# Patient Record
Sex: Female | Born: 1970 | Race: White | Hispanic: No | Smoking: Current every day smoker
Health system: Southern US, Community
[De-identification: ages and names within clinical notes are randomized; demographics above are authoritative.]

## PROBLEM LIST (undated history)

## (undated) ENCOUNTER — Emergency Department (HOSPITAL_BASED_OUTPATIENT_CLINIC_OR_DEPARTMENT_OTHER): Admission: EM | Payer: Self-pay | Source: Home / Self Care

## (undated) DIAGNOSIS — Z5189 Encounter for other specified aftercare: Secondary | ICD-10-CM

## (undated) DIAGNOSIS — K274 Chronic or unspecified peptic ulcer, site unspecified, with hemorrhage: Secondary | ICD-10-CM

## (undated) DIAGNOSIS — F988 Other specified behavioral and emotional disorders with onset usually occurring in childhood and adolescence: Secondary | ICD-10-CM

## (undated) DIAGNOSIS — F329 Major depressive disorder, single episode, unspecified: Secondary | ICD-10-CM

## (undated) DIAGNOSIS — J189 Pneumonia, unspecified organism: Secondary | ICD-10-CM

## (undated) DIAGNOSIS — A4902 Methicillin resistant Staphylococcus aureus infection, unspecified site: Secondary | ICD-10-CM

## (undated) DIAGNOSIS — F419 Anxiety disorder, unspecified: Secondary | ICD-10-CM

## (undated) DIAGNOSIS — D649 Anemia, unspecified: Secondary | ICD-10-CM

## (undated) DIAGNOSIS — N809 Endometriosis, unspecified: Secondary | ICD-10-CM

## (undated) DIAGNOSIS — D219 Benign neoplasm of connective and other soft tissue, unspecified: Secondary | ICD-10-CM

## (undated) DIAGNOSIS — F32A Depression, unspecified: Secondary | ICD-10-CM

## (undated) HISTORY — DX: Chronic or unspecified peptic ulcer, site unspecified, with hemorrhage: K27.4

## (undated) HISTORY — PX: BREAST ENHANCEMENT SURGERY: SHX7

## (undated) HISTORY — DX: Benign neoplasm of connective and other soft tissue, unspecified: D21.9

## (undated) HISTORY — PX: DILATION AND CURETTAGE OF UTERUS: SHX78

## (undated) HISTORY — DX: Major depressive disorder, single episode, unspecified: F32.9

## (undated) HISTORY — DX: Depression, unspecified: F32.A

## (undated) HISTORY — DX: Endometriosis, unspecified: N80.9

---

## 1999-08-12 ENCOUNTER — Other Ambulatory Visit: Admission: RE | Admit: 1999-08-12 | Discharge: 1999-08-12 | Payer: Self-pay | Admitting: Obstetrics and Gynecology

## 2001-02-01 ENCOUNTER — Encounter: Admission: RE | Admit: 2001-02-01 | Discharge: 2001-05-02 | Payer: Self-pay | Admitting: Family Medicine

## 2003-04-10 ENCOUNTER — Ambulatory Visit (HOSPITAL_COMMUNITY): Admission: RE | Admit: 2003-04-10 | Discharge: 2003-04-10 | Payer: Self-pay | Admitting: Obstetrics and Gynecology

## 2003-07-24 ENCOUNTER — Ambulatory Visit (HOSPITAL_COMMUNITY): Admission: RE | Admit: 2003-07-24 | Discharge: 2003-07-24 | Payer: Self-pay | Admitting: Obstetrics and Gynecology

## 2003-08-10 ENCOUNTER — Ambulatory Visit (HOSPITAL_COMMUNITY): Admission: RE | Admit: 2003-08-10 | Discharge: 2003-08-10 | Payer: Self-pay | Admitting: Obstetrics & Gynecology

## 2003-08-12 ENCOUNTER — Inpatient Hospital Stay (HOSPITAL_COMMUNITY): Admission: AD | Admit: 2003-08-12 | Discharge: 2003-08-12 | Payer: Self-pay | Admitting: Obstetrics and Gynecology

## 2003-08-16 ENCOUNTER — Inpatient Hospital Stay (HOSPITAL_COMMUNITY): Admission: AD | Admit: 2003-08-16 | Discharge: 2003-08-20 | Payer: Self-pay | Admitting: Obstetrics & Gynecology

## 2003-08-17 ENCOUNTER — Encounter (INDEPENDENT_AMBULATORY_CARE_PROVIDER_SITE_OTHER): Payer: Self-pay | Admitting: *Deleted

## 2003-08-21 ENCOUNTER — Encounter: Admission: RE | Admit: 2003-08-21 | Discharge: 2003-09-20 | Payer: Self-pay | Admitting: Obstetrics & Gynecology

## 2003-09-17 ENCOUNTER — Other Ambulatory Visit: Admission: RE | Admit: 2003-09-17 | Discharge: 2003-09-17 | Payer: Self-pay | Admitting: Obstetrics & Gynecology

## 2005-01-09 HISTORY — PX: LAPAROSCOPIC GASTRIC BYPASS: SUR771

## 2006-01-09 HISTORY — PX: AUGMENTATION MAMMAPLASTY: SUR837

## 2006-01-09 HISTORY — PX: ABDOMINAL SURGERY: SHX537

## 2006-12-23 ENCOUNTER — Inpatient Hospital Stay (HOSPITAL_COMMUNITY): Admission: EM | Admit: 2006-12-23 | Discharge: 2006-12-25 | Payer: Self-pay | Admitting: Emergency Medicine

## 2006-12-24 ENCOUNTER — Encounter (INDEPENDENT_AMBULATORY_CARE_PROVIDER_SITE_OTHER): Payer: Self-pay | Admitting: Internal Medicine

## 2006-12-24 ENCOUNTER — Ambulatory Visit: Payer: Self-pay | Admitting: *Deleted

## 2010-05-24 NOTE — Discharge Summary (Signed)
Katherine [name], KJOS NO.:  [id number]   MEDICAL RECORD NO.:  [id number]          PATIENT TYPE:  INP   LOCATION:  5118                         FACILITY:  [facility]   PHYSICIAN:  Marcellus Scott, MD     DATE OF BIRTH:  1970-12-05   DATE OF ADMISSION:  12/23/2006  DATE OF DISCHARGE:  12/25/2006                               DISCHARGE SUMMARY   PRIMARY MEDICAL DOCTOR:  Dr. Lisette Grinder in [place], [place].   PLASTIC SURGEON:  Dr. Foster Simpson in [place], [place].  Telephone  number [phone].   ENDOCRINOLOGIST:  Veverly Fells. Altheimer, M.D.   ONCOLOGIST:  Dr. Flonnie Hailstone in [place], [place].   DISCHARGE DIAGNOSES:  1. Bilateral lower extremity swelling secondary to fluid overload      secondary to perioperative fluid resuscitation, resolved.  2. Symptomatic acute on chronic anemia, acute anemia secondary to      blood loss perioperatively.  Chronic iron-deficiency anemia      secondary to menorrhagia.  Status post three units of packed red      blood cell transfusions.  3. Status post lower abdominal lift surgery.  4. Anxiety/panic attacks.   DISCHARGE MEDICATIONS:  1. Zoloft 25 mg p.o. daily.  2. Ferrous sulfate 325 mg p.o. b.i.d.  3. Percocet 5/325 mg one p.o. q.4-6h. p.r.n.  4. Xanax 0.5 mg p.o. daily p.r.n.   PROCEDURES:  Venous Dopplers, bilateral lower extremities.  Impression:  No evidence of DVT, superficial thrombosis or Baker cyst.   PERTINENT LABS:  Basic metabolic panel unremarkable with BUN of 8,  creatinine of 0.6.  CBC's:  Post-transfusion hemoglobin of 9.3,  hematocrit 28, MCV 90.5, white blood cells 6.2, platelets 348.  Iron is  20, total iron-binding capacity 492, percentage saturation 4, serum  ferritin of 24, serum folate 8.3, serum B12, 301.  Serum albumin was  2.3.  Urinalysis negative for features of urinary tract infection.  Urine pregnancy test was negative.   CONSULTATIONS:  None.   HOSPITAL COURSE AND PATIENT  DISPOSITION:  For details of initial  admission, please refer to the history and physical note.  In summary,  [name] is a pleasant 40 year old female patient with history of panic  attacks, anxiety disorder, previous history of diabetes which is  resolved status post gastric bypass surgery, recent lower body lift  surgery, who presented with swelling of the lower extremities, left more  than the right, however with no pain or cardiac or respiratory symptoms.  Her plastic surgeon was concerned for a DVT and advised the patient to  present to the emergency room.  The patient was thereby admitted to the  hospital.  She was hemodynamically stable.  She was placed on empiric  Lovenox for DVT prophylaxis while venous Dopplers were being obtained.  The venous Dopplers have returned negative and the patient's lower  extremity swelling has also resolved.  However, the patient was noted to  be markedly anemic with a hemoglobin of 5.9 and she was symptomatic,  complaining of feeling tired.  The patient thereby had an anemia panel  done and was transfused 3  units of packed red blood cells with  improvement in her anemia.  I spoke with Dr. Foster Simpson over the phone a short  while ago.  He indicated that the patient had some blood loss but not a  lot perioperatively; however, she gained 15 pounds of weight  perioperatively secondary to fluid resuscitation.  He advised to  continue to keep the JP drain until the patient follows up with him.  The patient has also been advised to follow up with her gynecologist for  evaluation and addressing her menorrhagia, which seems to be the reason  for her chronic anemia.  The patient at this time is stable for  discharge to be followed up as an outpatient by her primary medical  doctor with a CBC within a week's time and with her plastic surgeon for  evaluation and removal of the JP drain.      Marcellus Scott, MD  Electronically Signed     AH/MEDQ  D:   12/25/2006  T:  12/25/2006  Job:  [id number]   cc:   Dr. Lisette Grinder  Dr. Foster Simpson, Plastic Surgeon  Veverly Fells. Altheimer, M.D.  Dr. Flonnie Hailstone, Oncologist

## 2010-05-24 NOTE — H&P (Signed)
Katherine [name], GREN                ACCOUNT NO.:  [id number]   MEDICAL RECORD NO.:  [id number]          PATIENT TYPE:  EMS   LOCATION:  MAJO                         FACILITY:  [facility]   PHYSICIAN:  Wilson Singer, M.D.DATE OF BIRTH:  [birth year]   DATE OF ADMISSION:  12/23/2006  DATE OF DISCHARGE:                              HISTORY & PHYSICAL   HISTORY:  This is a 40 year old lady who had, approximately 12 days ago,  plastic surgery involving a lower body lift and a thigh lift, which  involved surgery to her lower abdomen and thigh areas.  In the last two  days or so, she had swelling of the left leg more than the right and  especially today the right leg also seems to be swollen.  She has not  really had any pain in the legs nor has there been any chest pain or  shortness of breath or hemoptysis.  She called her plastic surgeon who  was concerned about possibility of a deep vein thrombosis and told to  come to the emergency room.  She had the surgeries in [place], [place], and she is visiting her sister here in [place], [place].   PAST MEDICAL HISTORY:  1. History of panic attacks and anxiety.  2. History of previous diabetes which has now been resolved/improved      with a gastric bypass surgery.   PAST SURGICAL HISTORY:  1. Cesarean section August 2005.  2. Gastric bypass surgery February 2006.  3. Breast plastic surgery, tummy-tuck plastic surgery February 2008.  4. Recent plastic surgery as mentioned above on December 11, 2006.   SOCIAL HISTORY:  She has been married for 10 years.  She quit smoking  four years ago.  She drinks alcohol, which typically is twice a week and  each time, she drinks a bottle of wine.  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   FAMILY HISTORY:  Noncontributory.   REVIEW OF SYSTEMS:  Apart from the symptoms mentioned above, there are  no other symptoms referable to all systems reviewed.   PHYSICAL  EXAMINATION:  VITAL SIGNS:  Temperature 98, blood pressure  110/69, pulse 80-90, respiratory rate 12-14, saturation 100%.  GENERAL APPEARANCE:  She does not look in any discomfort.  She is  clinically well-hydrated.  CARDIOVASCULAR:  Heart sounds are present and normal.  There are no  murmurs.  RESPIRATORY:  Lung fields are clear.  ABDOMEN:  Soft and recent scars from her plastic surgery.  There is a  drain in the right perineal area which is draining bloody fluid.  NEUROLOGICAL:  Alert and oriented with no focal neurologic signs.  Legs  are swollen to some degree but there is no firmness about them.   INVESTIGATIONS:  So far, urinalysis is essentially negative.   IMPRESSION:  1. Swelling of legs, left greater than right, possible deep vein      thrombosis, clinically unlikely.  2. History of anxiety and panic is disorder.  3. Status post gastric bypass surgery.   PLAN:  1. Admit.  2. Prophylactic dose of Lovenox.  3. Bilateral leg venous Dopplers in the morning.      Wilson Singer, M.D.  Electronically Signed     NCG/MEDQ  D:  12/23/2006  T:  12/24/2006  Job:  [id number]

## 2010-05-27 NOTE — Op Note (Signed)
Katherine [name], Katherine [name]                          ACCOUNT NO.:  [id number]   MEDICAL RECORD NO.:  [id number]                   PATIENT TYPE:  INP   LOCATION:  9128                                 [facility]   PHYSICIAN:  Gerrit Friends. Aldona Bar, M.D.                DATE OF BIRTH:  13-Jul-1970   DATE OF PROCEDURE:  08/17/2003  DATE OF DISCHARGE:                                 OPERATIVE REPORT   PREOPERATIVE DIAGNOSES:  1. Thirty-eight week intrauterine pregnancy.  2. Type 2 diabetes mellitus.  3. Obesity.  4. Failed induction attempt.  5. Nonreassuring fetal heart tracing.   PROCEDURE:  Primary low transverse cesarean section.   SURGEON:  Gerrit Friends. Aldona Bar, M.D.   ANESTHESIA:  Epidural.   HISTORY:  This 40 year old gravida 3, para 0, was admitted at 38 weeks plus  for induction secondary to diabetes, for which she was maintained on insulin  during her pregnancy.  She was on oral agents prior to pregnancy with normal  hemoglobin A1C's.  Her insulin was fairly well-controlled during her  pregnancy as well.  She was admitted on the evening of August 7 for cervical  ripening.  On the morning of August 8 she had progressed to 2 cm dilatation.  Amniotomy was carried out with production of clear fluid and an IUPC was  placed and Glucommander orders were begun.  She was begun on penicillin  protocol for positive group B strep.  She progressed to approximately 4 cm  dilatation at noon on August 8 but when I checked the patient at  approximately 5 p.m. on the evening of August 8, her cervix was unchanged in  spite of good contractions documented by an IUPC.  In addition, she was  having variables that appeared to have a questionable late component and to  me were somewhat nonreassuring.  The sugars were in good control and after  discussion with the patient, considering the variables and considering no  cervical change for about four hours with good labor documentation, a  decision was made to  proceed with primary low transverse cesarean section  for delivery.  She was taken to the operating room accordingly.   OPERATIVE PROCEDURE:  Once in the operating room, her epidural was  augmented.  A Foley catheter was already in place.  Once the patient was  adequately prepped and draped and good anesthetic levels were documented,  the procedure was begun.  A Pfannenstiel incision was made and with minimal  difficulty dissected down to and through the fascia in a low transverse  fashion with hemostasis created at each layer.  Subfascial space was created  inferiorly and superiorly, muscles separated in the midline, peritoneum  identified and entered appropriately with care taken to avoid the bowel  superiorly and the bladder inferiorly.  At this time the vesicouterine  peritoneum was identified, incised in a low transverse fashion, and pushed  off the lower uterine segment with ease.  Sharp incision into the uterus in  a low transverse fashion was then made with the Metzenbaum scissors and  extended laterally with the fingers.  Thereafter, with the aid of the vacuum  extractor, delivery of a viable infant which cried spontaneously at once was  carried out.  Apgars were assigned at 9 and 9 and subsequently the infant  was taken to the nursery in good condition  and weight was found to be 6  pounds 15 ounces.   After the cord bloods were collected, the placenta was delivered intact and  the placenta was sent to pathology, labeled, insulin-requiring diabetes.  Once the placenta had been delivered, the uterus was exteriorized and  rendered free of any remaining products of conception and then after good  uterine contractility was afforded with slowly-given intravenous Pitocin and  manual stimulation, closure of the uterus was begun.  The uterine incision  was closed with a single layer of #1 Vicryl in a running locked fashion and  oversewn with several figure-of-eight #1 Vicryls as well.   At this time with  good hemostasis and good uterine contractility noted, the bladder flap was  reapproximated using 0 Vicryl in an interrupted fashion.  At this time tubes  and ovaries were inspected and noted to be normal and after the abdomen was  lavaged of all free blood and clot, the uterus was replaced into the  abdominal cavity.  At this time with all counts being correct and no foreign  bodies noted to be remaining in the abdominal cavity, closure of the  abdominal peritoneum was carried out with 0 Vicryl in a running fashion and  muscle subsequently secured with same.  At this time the subfascial space  was rendered free of any clot and blood and good hemostasis achieved.  Closure of the fascia at this time was carried out using 0 Vicryl from angle  to midline bilaterally.  The subcutaneous tissue was then rendered  hemostatic and closure of the subcutaneous tissue was carried out using  interrupted 2-0 plain.  At this time staples were used to close the skin and  a sterile pressure dressing was applied.  The patient at this time was  transported to the recovery room in satisfactory condition, having tolerated  the procedure well.  All counts were noted to be correct x2.  Estimated  blood loss 750 mL.   In summary, this patient essentially had no progression of her cervical  dilatation for about four hours on adequate doses of Pitocin as documented  by an IUPC, and there were variables occurring spontaneously, some of which  had a late component, and it is for this reason primarily that the patient  was taken to the operating room for delivery by low transverse cesarean  section.   At conclusion of the procedure, both mother and baby were doing well in  their respective recovery areas.                                               Gerrit Friends. Aldona Bar, M.D.    RMW/MEDQ  D:  08/17/2003  T:  08/18/2003  Job:  [id number]

## 2010-05-27 NOTE — Discharge Summary (Signed)
Katherine [name], Katherine [name]                          ACCOUNT NO.:  [id number]   MEDICAL RECORD NO.:  [id number]                   PATIENT TYPE:  INP   LOCATION:  9128                                 [facility]   PHYSICIAN:  Miguel Aschoff, M.D.                    DATE OF BIRTH:  [birth year]   DATE OF ADMISSION:  08/16/2003  DATE OF DISCHARGE:  08/20/2003                                 DISCHARGE SUMMARY   FINAL DIAGNOSES:  1.  Intrauterine pregnancy at [redacted] weeks gestation.  2.  Type 2 diabetes mellitus.  3.  Obesity.  4.  Failed induction attempt.  5.  Nonreassuring fetal heart tracing.   PROCEDURE:  Primary low transverse cesarean section.  Surgeon:  Dr. Annamaria Helling.  Complications:  None.   This 40 year old G3 P0-0-2-0 presents at [redacted] weeks gestation for an induction  secondary to type 2 diabetes mellitus.  The patient has remained on insulin  during her pregnancy.  She was on oral agents prior to pregnancy with normal  hemoglobin A1c.  Her diabetes was fairly well controlled during her  pregnancy.  Otherwise, the patient's antepartum course had been complicated  by obesity; she has a BMI of about 41.5, positive group B strep was  performed in the office, and a history of panic attacks which actually did  not seem to be a problem during her pregnancy.  She was also followed for  her diabetes by Dr. Leslie Dales throughout the pregnancy as well.  Her rubella  status was equivocal.  She was admitted on the evening of August 16, 2003 for  cervical ripening, and on the morning of August 8 she progressed to about 2  cm dilated where amniotomy was carried out with clear fluid.  IUPC was  placed and Glucomander orders were begun.  The patient was started on  penicillin as well for a positive group B strep status.  By noon she had  dilated to about 4 cm, and by 5 p.m. that evening her cervix was unchanged  despite good contraction pattern.  The patient at that point began to have  some variables  with a questionable late component and this became somewhat  nonreassuring to Dr. Aldona Bar.  At this point a decision was made to proceed  with a cesarean section secondary to failed induction and nonreassuring  fetal heart tracing.  The patient was taken to the operating room by Dr.  Annamaria Helling on August 17, 2003 where a primary low transverse cesarean  section was performed with the delivery of a 6-pound 15-ounce female infant  with Apgars of 9 and 9.  Delivery went without complication.  The patient's  postoperative course was complicated by some mild postoperative anemia and  also she was controlled with her diabetes with a Glucomander during her  hospital stay.  The patient was felt ready for discharge on postoperative  day #3.  She was sent home on a regular diet, told to decrease activities,  told to continue her insulin per Dr. Leslie Dales, declined her rubella vaccine  postpartum secondary to her equivocal status, told to continue her vitamins  and iron, was given Tylox one to two q.4h. as needed for pain, of course was  told to call with any problems, was to follow up in our office in 5-6 weeks  and with Dr. Leslie Dales as planned with him.   LABORATORY DATA ON DISCHARGE:  The patient had a hemoglobin of 9.1, white  blood cell count of 11.1.     Katherine [name], P.A.-C.                Miguel Aschoff, M.D.    MB/MEDQ  D:  09/07/2003  T:  09/08/2003  Job:  [id number]

## 2010-10-14 LAB — COMPREHENSIVE METABOLIC PANEL
Albumin: 2.3 — ABNORMAL LOW
Alkaline Phosphatase: 72
BUN: 6
Potassium: 3.9
Total Protein: 4.9 — ABNORMAL LOW

## 2010-10-14 LAB — IRON AND TIBC
Iron: 20 — ABNORMAL LOW
Saturation Ratios: 4 — ABNORMAL LOW
UIBC: 472

## 2010-10-14 LAB — CBC
Hemoglobin: 8.1 — ABNORMAL LOW
MCHC: 33.2
MCV: 90.5
Platelets: 348
RBC: 2.67 — ABNORMAL LOW
RBC: 2.72 — ABNORMAL LOW
RDW: 15.1
WBC: 6
WBC: 6.2

## 2010-10-14 LAB — BASIC METABOLIC PANEL
CO2: 28
Chloride: 107
Sodium: 141

## 2010-10-14 LAB — VITAMIN B12: Vitamin B-12: 301 (ref 211–911)

## 2010-10-14 LAB — FOLATE: Folate: 8.3

## 2010-10-14 LAB — PREPARE RBC (CROSSMATCH)

## 2010-10-17 LAB — POCT I-STAT CREATININE: Operator id: 151321

## 2010-10-17 LAB — URINALYSIS, ROUTINE W REFLEX MICROSCOPIC
Glucose, UA: NEGATIVE
Nitrite: NEGATIVE
Protein, ur: NEGATIVE
pH: 6

## 2010-10-17 LAB — CROSSMATCH: ABO/RH(D): O POS

## 2010-10-17 LAB — CBC
HCT: 18.1 — ABNORMAL LOW
Hemoglobin: 5.9 — CL
Hemoglobin: 6 — CL
MCV: 92.8
RBC: 1.94 — ABNORMAL LOW
RDW: 14
WBC: 6.1

## 2010-10-17 LAB — DIFFERENTIAL
Lymphocytes Relative: 26
Lymphs Abs: 1.6
Monocytes Absolute: 0.4
Monocytes Relative: 6

## 2010-10-17 LAB — COMPREHENSIVE METABOLIC PANEL
ALT: 17
AST: 18
CO2: 25
GFR calc non Af Amer: >60
Glucose, Bld: 128 — ABNORMAL HIGH
Potassium: 3.9
Sodium: 141

## 2010-10-17 LAB — POCT PREGNANCY, URINE
Operator id: 151321
Preg Test, Ur: NEGATIVE

## 2010-10-17 LAB — I-STAT 8, (EC8 V) (CONVERTED LAB)
BUN: 6
Glucose, Bld: 86
Potassium: 4.2
Sodium: 140

## 2010-10-17 LAB — ABO/RH: ABO/RH(D): O POS

## 2011-02-19 ENCOUNTER — Emergency Department (HOSPITAL_BASED_OUTPATIENT_CLINIC_OR_DEPARTMENT_OTHER)
Admission: EM | Admit: 2011-02-19 | Discharge: 2011-02-19 | Disposition: A | Discharge status: Home / Self Care | Payer: Self-pay | Attending: Emergency Medicine | Admitting: Emergency Medicine

## 2011-02-19 ENCOUNTER — Emergency Department (INDEPENDENT_AMBULATORY_CARE_PROVIDER_SITE_OTHER): Payer: Self-pay

## 2011-02-19 ENCOUNTER — Encounter (HOSPITAL_BASED_OUTPATIENT_CLINIC_OR_DEPARTMENT_OTHER): Payer: Self-pay | Admitting: *Deleted

## 2011-02-19 DIAGNOSIS — R109 Unspecified abdominal pain: Secondary | ICD-10-CM

## 2011-02-19 DIAGNOSIS — R112 Nausea with vomiting, unspecified: Secondary | ICD-10-CM | POA: Insufficient documentation

## 2011-02-19 DIAGNOSIS — M549 Dorsalgia, unspecified: Secondary | ICD-10-CM

## 2011-02-19 DIAGNOSIS — F988 Other specified behavioral and emotional disorders with onset usually occurring in childhood and adolescence: Secondary | ICD-10-CM | POA: Insufficient documentation

## 2011-02-19 DIAGNOSIS — Z9884 Bariatric surgery status: Secondary | ICD-10-CM

## 2011-02-19 DIAGNOSIS — R1031 Right lower quadrant pain: Secondary | ICD-10-CM | POA: Insufficient documentation

## 2011-02-19 HISTORY — DX: Other specified behavioral and emotional disorders with onset usually occurring in childhood and adolescence: F98.8

## 2011-02-19 LAB — URINALYSIS, ROUTINE W REFLEX MICROSCOPIC
Hgb urine dipstick: NEGATIVE
Ketones, ur: NEGATIVE mg/dL
Protein, ur: NEGATIVE mg/dL
Urobilinogen, UA: 1 mg/dL (ref 0.0–1.0)

## 2011-02-19 MED ORDER — SODIUM CHLORIDE 0.9 % IV SOLN
Freq: Once | INTRAVENOUS | Status: AC
  Administered 2011-02-19: 11:00:00 via INTRAVENOUS

## 2011-02-19 MED ORDER — ONDANSETRON HCL 4 MG/2ML IJ SOLN
4.0000 mg | Freq: Once | INTRAMUSCULAR | Status: AC
  Administered 2011-02-19: 4 mg via INTRAVENOUS
  Filled 2011-02-19: qty 2

## 2011-02-19 MED ORDER — HYDROCODONE-ACETAMINOPHEN 5-325 MG PO TABS: 1.0000 | ORAL_TABLET | ORAL | Status: AC | PRN

## 2011-02-19 MED ORDER — HYDROMORPHONE HCL PF 1 MG/ML IJ SOLN
1.0000 mg | Freq: Once | INTRAMUSCULAR | Status: AC
  Administered 2011-02-19: 1 mg via INTRAVENOUS
  Filled 2011-02-19: qty 1

## 2011-02-19 NOTE — ED Provider Notes (Signed)
History     CSN: [id number]  Arrival date & time 02/19/11  7829   First MD Initiated Contact with Patient 02/19/11 1044      Chief Complaint  Patient presents with  . Abdominal Pain    (Consider location/radiation/quality/duration/timing/severity/associated sxs/prior treatment) Patient is a 41 y.o. female presenting with abdominal pain. The history is provided by the patient.  Abdominal Pain The primary symptoms of the illness include abdominal pain.  She had onset last night of pain in her right flank radiating to the right lower quadrant. This is been associated with urinary urgency, frequency, and tenesmus. She has not had any dysuria. She has had nausea and vomiting. Pain is severe and she rates it at 8/10. Pain is somewhat better she lays on left side with her hips and knees flexed. Nothing makes it worse. She denies fever, chills, sweats. She denies constipation or diarrhea. She does have a history of kidney stones which felt somewhat similar to this. She also has a history of ovarian cyst. She describes her symptoms as severe.  Past Medical History  Diagnosis Date  . ADD (attention deficit disorder)     Past Surgical History  Procedure Date  . Gastric bypass   . Cesarean section   . Breast enhancement surgery     No family history on file.  History  Substance Use Topics  . Smoking status: Current Some Day Smoker  . Smokeless tobacco: Not on file  . Alcohol Use: No    OB History    Grav Para Term Preterm Abortions TAB SAB Ect Mult Living                  Review of Systems  Gastrointestinal: Positive for abdominal pain.  All other systems reviewed and are negative.    Allergies  Nsaids; Phenergan; and Tramadol  Home Medications   Current Outpatient Rx  Name Route Sig Dispense Refill  . TYLENOL PO Oral Take by mouth.    Marland Kitchen XANAX PO Oral Take by mouth.    . ADDERALL PO Oral Take by mouth.    Marland Kitchen PROZAC PO Oral Take by mouth.      BP 117/71  Pulse 95   Temp(Src) 98.3 F (36.8 C) (Oral)  Resp 18  SpO2 100%  Physical Exam  Nursing note and vitals reviewed.  41 year old female appears uncomfortable. Vital signs are normal. Oxygen saturation is 100% which is normal. Head is normocephalic and atraumatic. PERRLA, EOMI. Oropharynx is clear. Neck is nontender and supple without adenopathy JVD. Back is nontender and there is no CVA tenderness. Lungs are clear without rales, wheezes, or rhonchi. Heart has regular rate and rhythm without murmur. Abdomen is soft, flat, with mild right lower quadrant tenderness without rebound or guarding. Peristalsis is diminished but present. Extremities have no cyanosis or edema, full range of motion is present. Skin is warm and moist without rash. Neurologic: Mental status is normal, cranial nerves are intact, there no focal motor or sensory deficits.  ED Course  Procedures (including critical care time)  Results for orders placed during the hospital encounter of 02/19/11  PREGNANCY, URINE      Component Value Range   Preg Test, Ur NEGATIVE  NEGATIVE   URINALYSIS, ROUTINE W REFLEX MICROSCOPIC      Component Value Range   Color, Urine YELLOW  YELLOW    APPearance CLEAR  CLEAR    Specific Gravity, Urine 1.023  1.005 - 1.030    pH 6.5  5.0 -  8.0    Glucose, UA NEGATIVE  NEGATIVE (mg/dL)   Hgb urine dipstick NEGATIVE  NEGATIVE    Bilirubin Urine NEGATIVE  NEGATIVE    Ketones, ur NEGATIVE  NEGATIVE (mg/dL)   Protein, ur NEGATIVE  NEGATIVE (mg/dL)   Urobilinogen, UA 1.0  0.0 - 1.0 (mg/dL)   Nitrite NEGATIVE  NEGATIVE    Leukocytes, UA NEGATIVE  NEGATIVE    Ct Abdomen Pelvis Wo Contrast  02/19/2011  *RADIOLOGY REPORT*  Clinical Data: Right flank/back pain, nausea/vomiting, status post gastric bypass  CT ABDOMEN AND PELVIS WITHOUT CONTRAST  Technique:  Multidetector CT imaging of the abdomen and pelvis was performed following the standard protocol without intravenous contrast.  Comparison: None.  Findings: Lung  bases are clear.  Unenhanced liver, spleen, pancreas, and adrenal glands within normal limits.  Gallbladder unremarkable.  No intrahepatic or extrahepatic ductal dilatation.  Kidneys are within normal limits.  No renal calculi or hydronephrosis.  Status post gastric bypass.  No evidence of bowel obstruction. Normal appendix.  No evidence of abdominal aortic aneurysm.  No abdominopelvic ascites.  No suspicious abdominopelvic lymphadenopathy.  Uterus is unremarkable.  3.5 x 2.3 cm right ovarian cyst / follicle.  No ureteral or bladder calculi.  Mild degenerative changes of the visualized thoracolumbar spine.  IMPRESSION: No renal, ureteral, or bladder calculi.  No hydronephrosis.  Normal appendix.  Status post gastric bypass.  No evidence of bowel obstruction.  3.5 cm right ovarian cyst / follicle.  Original Report Authenticated By: Charline Bills, M.D.    Workup is negative except for ovarian cysts which I doubt is what is causing her pain. She will be sent home with a prescription for Norco and instructions to return if symptoms worsen.  1. Abdominal pain       MDM  Flank and abdominal pain which is most likely related to kidney stone, possible urinary tract infection with pyelonephritis. I doubt ovarian cyst as will be very unusual for pain from an ovarian cyst to radiate to the flank. CT scan and urinalysis have been ordered and she'll be given narcotics and antiemetics for symptom relief.        Dione Booze, MD 02/19/11 1537

## 2011-02-19 NOTE — ED Notes (Signed)
Patient states that she started having R sided back and abd pain since last, vomited last night, some nausea

## 2011-04-19 ENCOUNTER — Encounter (HOSPITAL_BASED_OUTPATIENT_CLINIC_OR_DEPARTMENT_OTHER): Payer: Self-pay | Admitting: *Deleted

## 2011-04-19 ENCOUNTER — Emergency Department (HOSPITAL_BASED_OUTPATIENT_CLINIC_OR_DEPARTMENT_OTHER)
Admission: EM | Admit: 2011-04-19 | Discharge: 2011-04-19 | Disposition: A | Discharge status: Home / Self Care | Payer: Medicaid Other | Attending: Emergency Medicine | Admitting: Emergency Medicine

## 2011-04-19 DIAGNOSIS — Z79899 Other long term (current) drug therapy: Secondary | ICD-10-CM | POA: Insufficient documentation

## 2011-04-19 DIAGNOSIS — L0291 Cutaneous abscess, unspecified: Secondary | ICD-10-CM

## 2011-04-19 DIAGNOSIS — IMO0002 Reserved for concepts with insufficient information to code with codable children: Secondary | ICD-10-CM | POA: Insufficient documentation

## 2011-04-19 DIAGNOSIS — F988 Other specified behavioral and emotional disorders with onset usually occurring in childhood and adolescence: Secondary | ICD-10-CM | POA: Insufficient documentation

## 2011-04-19 MED ORDER — HYDROCODONE-ACETAMINOPHEN 5-325 MG PO TABS: 1.0000 | ORAL_TABLET | ORAL | Status: AC | PRN

## 2011-04-19 MED ORDER — DOXYCYCLINE HYCLATE 100 MG PO CAPS: 100.0000 mg | ORAL_CAPSULE | Freq: Two times a day (BID) | ORAL | Status: AC

## 2011-04-19 MED ORDER — HYDROCODONE-ACETAMINOPHEN 5-325 MG PO TABS
1.0000 | ORAL_TABLET | Freq: Once | ORAL | Status: AC
  Administered 2011-04-19: 1 via ORAL
  Filled 2011-04-19: qty 1

## 2011-04-19 MED ORDER — LIDOCAINE-EPINEPHRINE (PF) 2 %-1:200000 IJ SOLN
20.0000 mL | Freq: Once | INTRAMUSCULAR | Status: AC
  Administered 2011-04-19: 20 mL
  Filled 2011-04-19: qty 20

## 2011-04-19 NOTE — Discharge Instructions (Signed)

## 2011-04-19 NOTE — ED Provider Notes (Addendum)
History     CSN: [id number]  Arrival date & time 04/19/11  1024   First MD Initiated Contact with Patient 04/19/11 1120      Chief Complaint  Patient presents with  . Abscess    (Consider location/radiation/quality/duration/timing/severity/associated sxs/prior treatment) HPI Comments: Patient has noted gradually worsening found in her right axilla.  It is erythematous and tender to palpation.  She's noted it started and it formed a head like a pimple.  The pain is now radiating down her arm slightly and towards her breast.  No fevers.  No nausea vomiting.  One prior abscess to her scalp.  It is worse with palpation.  Patient has not taken anything for the pain.  Patient is a 41 y.o. female presenting with abscess. The history is provided by the patient. No language interpreter was used.  Abscess  This is a new problem. The current episode started less than one week ago. The onset was gradual. The problem occurs rarely. The problem has been gradually worsening. Pertinent negatives include no fever, no diarrhea, no vomiting and no cough.    Past Medical History  Diagnosis Date  . ADD (attention deficit disorder)     Past Surgical History  Procedure Date  . Gastric bypass   . Cesarean section   . Breast enhancement surgery     History reviewed. No pertinent family history.  History  Substance Use Topics  . Smoking status: Current Some Day Smoker  . Smokeless tobacco: Not on file  . Alcohol Use: No    OB History    Grav Para Term Preterm Abortions TAB SAB Ect Mult Living                  Review of Systems  Constitutional: Negative.  Negative for fever and chills.  HENT: Negative.   Eyes: Negative.  Negative for discharge and redness.  Respiratory: Negative.  Negative for cough and shortness of breath.   Cardiovascular: Negative.  Negative for chest pain.  Gastrointestinal: Negative.  Negative for nausea, vomiting, abdominal pain and diarrhea.  Genitourinary:  Negative.  Negative for dysuria and vaginal discharge.  Musculoskeletal: Negative.  Negative for back pain.  Skin: Positive for color change. Negative for rash.  Neurological: Negative.  Negative for syncope and headaches.  Hematological: Negative.  Negative for adenopathy.  Psychiatric/Behavioral: Negative.  Negative for confusion.  All other systems reviewed and are negative.    Allergies  Nsaids; Phenergan; and Tramadol  Home Medications   Current Outpatient Rx  Name Route Sig Dispense Refill  . SERTRALINE HCL 50 MG PO TABS Oral Take 50 mg by mouth daily.    . TYLENOL PO Oral Take by mouth.    Marland Kitchen XANAX PO Oral Take by mouth.    . ADDERALL PO Oral Take by mouth.    Marland Kitchen PROZAC PO Oral Take by mouth.      BP 104/59  Pulse 80  Temp(Src) 97.6 F (36.4 C) (Oral)  Resp 18  SpO2 100%  LMP 03/29/2011  Physical Exam  Nursing note and vitals reviewed. Constitutional: She is oriented to person, place, and time. She appears well-developed and well-nourished.  Non-toxic appearance. She does not have a sickly appearance.  HENT:  Head: Normocephalic and atraumatic.  Eyes: Conjunctivae, EOM and lids are normal. Pupils are equal, round, and reactive to light. No scleral icterus.  Neck: Trachea normal and normal range of motion. Neck supple.  Cardiovascular: Normal rate.   Pulmonary/Chest: Effort normal.  Abdominal: Normal  appearance. There is no CVA tenderness.  Musculoskeletal: Normal range of motion.  Neurological: She is alert and oriented to person, place, and time. She has normal strength.  Skin: Skin is warm, dry and intact. No rash noted.       In the central right axilla there is a 1.5 cm red raised area that is fluctuant and has a white head in the center.  It is tender to palpation.  Psychiatric: She has a normal mood and affect. Her behavior is normal. Judgment and thought content normal.    ED Course  Procedures (including critical care time)  INCISION AND  DRAINAGE Performed by: Emeline General A Consent: Verbal consent obtained. Risks and benefits: risks, benefits and alternatives were discussed Type: abscess  Body area: R axilla  Anesthesia: local infiltration  Local anesthetic: lidocaine 2% with epinephrine  Anesthetic total: 4 ml  Complexity: complex Blunt dissection to break up loculations  Drainage: purulent  Drainage amount: moderate  Packing material: 1/4 in iodoform gauze  Patient tolerance: Patient tolerated the procedure well with no immediate complications.      MDM  Patient with localized area of abscess in her right axilla.  I will perform an incision and drainage.  If the patient has no significant surrounding cellulitis incision and drainage should be sufficient to treat this patient at this time.  Patient will be discharged with a prescription for doxycycline to be used if she's not improving.      Nat Christen, MD 04/19/11 1135  Nat Christen, MD 04/19/11 1255

## 2011-04-19 NOTE — ED Notes (Signed)
Pt amb to room 5 with quick steady gait in nad. Pt reports swelling to her right axilla x Monday. Denies any fevers or other c/o, pt states she has had previous abcesses.

## 2011-07-26 ENCOUNTER — Emergency Department (HOSPITAL_BASED_OUTPATIENT_CLINIC_OR_DEPARTMENT_OTHER): Payer: Self-pay

## 2011-07-26 ENCOUNTER — Emergency Department (HOSPITAL_BASED_OUTPATIENT_CLINIC_OR_DEPARTMENT_OTHER)
Admission: EM | Admit: 2011-07-26 | Discharge: 2011-07-27 | Disposition: A | Discharge status: Home / Self Care | Payer: Self-pay | Attending: Emergency Medicine | Admitting: Emergency Medicine

## 2011-07-26 ENCOUNTER — Encounter (HOSPITAL_BASED_OUTPATIENT_CLINIC_OR_DEPARTMENT_OTHER): Payer: Self-pay | Admitting: Emergency Medicine

## 2011-07-26 DIAGNOSIS — R109 Unspecified abdominal pain: Secondary | ICD-10-CM | POA: Insufficient documentation

## 2011-07-26 DIAGNOSIS — R05 Cough: Secondary | ICD-10-CM | POA: Insufficient documentation

## 2011-07-26 DIAGNOSIS — R1110 Vomiting, unspecified: Secondary | ICD-10-CM

## 2011-07-26 DIAGNOSIS — R51 Headache: Secondary | ICD-10-CM | POA: Insufficient documentation

## 2011-07-26 DIAGNOSIS — R079 Chest pain, unspecified: Secondary | ICD-10-CM | POA: Insufficient documentation

## 2011-07-26 DIAGNOSIS — R000 Tachycardia, unspecified: Secondary | ICD-10-CM | POA: Insufficient documentation

## 2011-07-26 DIAGNOSIS — E860 Dehydration: Secondary | ICD-10-CM | POA: Insufficient documentation

## 2011-07-26 DIAGNOSIS — M79609 Pain in unspecified limb: Secondary | ICD-10-CM | POA: Insufficient documentation

## 2011-07-26 DIAGNOSIS — R10816 Epigastric abdominal tenderness: Secondary | ICD-10-CM | POA: Insufficient documentation

## 2011-07-26 DIAGNOSIS — R112 Nausea with vomiting, unspecified: Secondary | ICD-10-CM | POA: Insufficient documentation

## 2011-07-26 DIAGNOSIS — R059 Cough, unspecified: Secondary | ICD-10-CM | POA: Insufficient documentation

## 2011-07-26 DIAGNOSIS — Z9884 Bariatric surgery status: Secondary | ICD-10-CM | POA: Insufficient documentation

## 2011-07-26 HISTORY — DX: Anemia, unspecified: D64.9

## 2011-07-26 LAB — COMPREHENSIVE METABOLIC PANEL
Albumin: 3.5 g/dL (ref 3.5–5.2)
Alkaline Phosphatase: 85 U/L (ref 39–117)
BUN: 8 mg/dL (ref 6–23)
Creatinine, Ser: 0.6 mg/dL (ref 0.50–1.10)
GFR calc Af Amer: >90 mL/min (ref >90)
Glucose, Bld: 112 mg/dL — ABNORMAL HIGH (ref 70–99)
Potassium: 3.5 mEq/L (ref 3.5–5.1)
Total Bilirubin: 0.2 mg/dL — ABNORMAL LOW (ref 0.3–1.2)
Total Protein: 6.8 g/dL (ref 6.0–8.3)

## 2011-07-26 LAB — URINALYSIS, ROUTINE W REFLEX MICROSCOPIC
Bilirubin Urine: NEGATIVE
Glucose, UA: NEGATIVE mg/dL
Hgb urine dipstick: NEGATIVE
Ketones, ur: NEGATIVE mg/dL
Specific Gravity, Urine: 1.029 (ref 1.005–1.030)
pH: 5.5 (ref 5.0–8.0)

## 2011-07-26 LAB — CBC WITH DIFFERENTIAL/PLATELET
Basophils Relative: 1 % (ref 0–1)
Eosinophils Absolute: 0.1 10*3/uL (ref 0.0–0.7)
HCT: 34.1 % — ABNORMAL LOW (ref 36.0–46.0)
Hemoglobin: 11.6 g/dL — ABNORMAL LOW (ref 12.0–15.0)
Lymphs Abs: 1.2 10*3/uL (ref 0.7–4.0)
MCH: 28.7 pg (ref 26.0–34.0)
MCHC: 34 g/dL (ref 30.0–36.0)
MCV: 84.4 fL (ref 78.0–100.0)
Monocytes Absolute: 0.3 10*3/uL (ref 0.1–1.0)
Monocytes Relative: 4 % (ref 3–12)
RBC: 4.04 MIL/uL (ref 3.87–5.11)

## 2011-07-26 LAB — LACTIC ACID, PLASMA: Lactic Acid, Venous: 1.6 mmol/L (ref 0.5–2.2)

## 2011-07-26 LAB — PREGNANCY, URINE: Preg Test, Ur: NEGATIVE

## 2011-07-26 LAB — LIPASE, BLOOD: Lipase: 47 U/L (ref 11–59)

## 2011-07-26 MED ORDER — ONDANSETRON HCL 4 MG PO TABS: 4.0000 mg | ORAL_TABLET | Freq: Four times a day (QID) | ORAL | Status: AC

## 2011-07-26 MED ORDER — ONDANSETRON HCL 4 MG/2ML IJ SOLN
4.0000 mg | Freq: Once | INTRAMUSCULAR | Status: AC
  Administered 2011-07-26: 4 mg via INTRAVENOUS
  Filled 2011-07-26: qty 2

## 2011-07-26 MED ORDER — HYDROMORPHONE HCL PF 1 MG/ML IJ SOLN
1.0000 mg | Freq: Once | INTRAMUSCULAR | Status: AC
  Administered 2011-07-26: 1 mg via INTRAVENOUS
  Filled 2011-07-26: qty 1

## 2011-07-26 MED ORDER — SODIUM CHLORIDE 0.9 % IV BOLUS (SEPSIS)
1000.0000 mL | Freq: Once | INTRAVENOUS | Status: AC
  Administered 2011-07-26: 1000 mL via INTRAVENOUS

## 2011-07-26 MED ORDER — FAMOTIDINE IN NACL 20-0.9 MG/50ML-% IV SOLN
20.0000 mg | Freq: Once | INTRAVENOUS | Status: DC
  Filled 2011-07-26: qty 50

## 2011-07-26 MED ORDER — MORPHINE SULFATE 4 MG/ML IJ SOLN
4.0000 mg | Freq: Once | INTRAMUSCULAR | Status: AC
  Administered 2011-07-26: 4 mg via INTRAVENOUS
  Filled 2011-07-26: qty 1

## 2011-07-26 MED ORDER — OXYCODONE-ACETAMINOPHEN 5-325 MG PO TABS: 1.0000 | ORAL_TABLET | Freq: Four times a day (QID) | ORAL | Status: AC | PRN

## 2011-07-26 NOTE — ED Notes (Signed)
Pt c/o fatigue, generalized body aches, nausea and vomiting since Monday. Pt reports diarrhea started today.

## 2011-07-26 NOTE — ED Notes (Signed)
Pt states she had 2 episodes of near syncope after vomiting. Pt states she has headache and muscles feel sore after these episodes.

## 2011-07-26 NOTE — ED Notes (Signed)
MD at bedside. 

## 2011-07-26 NOTE — ED Provider Notes (Signed)
History     CSN: [id number]  Arrival date & time 07/26/11  2053   First MD Initiated Contact with Patient 07/26/11 2112      Chief Complaint  Patient presents with  . Generalized Body Aches  . Emesis  . Nausea    (Consider location/radiation/quality/duration/timing/severity/associated sxs/prior treatment) Patient is a 41 y.o. female presenting with abdominal pain. The history is provided by the patient.  Abdominal Pain The primary symptoms of the illness include abdominal pain, fever, nausea, vomiting and diarrhea. The primary symptoms of the illness do not include shortness of breath or dysuria. The current episode started 2 days ago. The onset of the illness was gradual. The problem has been gradually worsening.  The abdominal pain is located in the epigastric region. The abdominal pain does not radiate. The severity of the abdominal pain is 6/10. The abdominal pain is relieved by nothing. The abdominal pain is exacerbated by vomiting and eating.  The vomiting began 2 days ago. Vomiting occurs more than 10 times per day. The emesis contains stomach contents (dry heaving).  Associated with: no alcohol or NSAID use. The patient states that she believes she is currently not pregnant. The patient has not had a change in bowel habit. Additional symptoms associated with the illness include chills. Symptoms associated with the illness do not include urgency or frequency. Significant associated medical issues do not include inflammatory bowel disease. Associated medical issues comments: hx of gastric bypass and PUD.    Past Medical History  Diagnosis Date  . ADD (attention deficit disorder)   . Anemia     Past Surgical History  Procedure Date  . Gastric bypass   . Cesarean section   . Breast enhancement surgery     No family history on file.  History  Substance Use Topics  . Smoking status: Current Some Day Smoker  . Smokeless tobacco: Not on file  . Alcohol Use: No    OB  History    Grav Para Term Preterm Abortions TAB SAB Ect Mult Living                  Review of Systems  Constitutional: Positive for fever and chills.  Respiratory: Positive for cough. Negative for shortness of breath.   Cardiovascular: Negative for chest pain and leg swelling.  Gastrointestinal: Positive for nausea, vomiting, abdominal pain and diarrhea.  Genitourinary: Negative for dysuria, urgency and frequency.  Psychiatric/Behavioral: Positive for confusion and decreased concentration.  All other systems reviewed and are negative.    Allergies  Nsaids; Promethazine hcl; and Tramadol  Home Medications   Current Outpatient Rx  Name Route Sig Dispense Refill  . ACETAMINOPHEN 500 MG PO TABS Oral Take 1,000 mg by mouth every 6 (six) hours as needed. Patient used this medication for her headache.    . ALPRAZOLAM 1 MG PO TABS Oral Take 1 mg by mouth at bedtime as needed. Patient uses 0.5 milligrams of this medication.    . AMPHETAMINE-DEXTROAMPHETAMINE 30 MG PO TABS Oral Take 30 mg by mouth daily.    . SERTRALINE HCL 50 MG PO TABS Oral Take 50 mg by mouth daily.      BP 114/68  Pulse 114  Temp 98.7 F (37.1 C) (Oral)  Resp 18  Ht 5\' 5"  (1.651 m)  Wt 118 lb (53.524 kg)  BMI 19.64 kg/m2  SpO2 100%  LMP 07/18/2011  Physical Exam  Nursing note and vitals reviewed. Constitutional: She is oriented to person, place, and time.  She appears well-developed and well-nourished. No distress.  HENT:  Head: Normocephalic and atraumatic.  Mouth/Throat: Oropharynx is clear and moist.  Eyes: Conjunctivae and EOM are normal. Pupils are equal, round, and reactive to light.  Neck: Normal range of motion. Neck supple.  Cardiovascular: Regular rhythm and intact distal pulses.  Tachycardia present.   No murmur heard. Pulmonary/Chest: Effort normal. Not tachypneic. No respiratory distress. She has no wheezes. She has rhonchi. She has no rales.  Abdominal: Soft. Normal appearance. She exhibits  no distension. There is tenderness in the epigastric area. There is guarding. There is no rebound and no CVA tenderness.  Musculoskeletal: Normal range of motion. She exhibits no edema and no tenderness.  Neurological: She is alert and oriented to person, place, and time.  Skin: Skin is warm and dry. No rash noted. No erythema.  Psychiatric: She has a normal mood and affect. Her behavior is normal.    ED Course  Procedures (including critical care time)  Labs Reviewed  CBC WITH DIFFERENTIAL - Abnormal; Notable for the following:    Hemoglobin 11.6 (*)     HCT 34.1 (*)     Platelets 432 (*)     All other components within normal limits  COMPREHENSIVE METABOLIC PANEL - Abnormal; Notable for the following:    Glucose, Bld 112 (*)     Total Bilirubin 0.2 (*)     All other components within normal limits  PREGNANCY, URINE  URINALYSIS, ROUTINE W REFLEX MICROSCOPIC  LIPASE, BLOOD  LACTIC ACID, PLASMA   Dg Chest 2 View  07/26/2011  *RADIOLOGY REPORT*  Clinical Data: Right-sided chest pain  CHEST - 2 VIEW  Comparison: None.  Findings: Cardiomediastinal silhouette is within normal limits. The lungs are clear. No pleural effusion.  No pneumothorax.  No acute osseous abnormality.  IMPRESSION: Normal chest.  Original Report Authenticated By: Harrel Lemon, M.D.   Dg Forearm Right  07/26/2011  *RADIOLOGY REPORT*  Clinical Data: Right forearm pain  RIGHT FOREARM - 2 VIEW  Comparison: None.  Findings: Overlying intravenous tubing obscures detail. No fracture or dislocation.  No soft tissue abnormality.  No radiopaque foreign body.  Apparent bone island at the head of the radius.  IMPRESSION: No acute abnormality.  Original Report Authenticated By: Harrel Lemon, M.D.   Ct Head Wo Contrast  07/26/2011  *RADIOLOGY REPORT*  Clinical Data: Headache, nausea, and vomiting after fall.  Possible seizure.  CT HEAD WITHOUT CONTRAST  Technique:  Contiguous axial images were obtained from the base of the  skull through the vertex without contrast.  Comparison: None.  Findings: The ventricles and sulci are symmetrical without significant effacement, displacement, or dilatation. No mass effect or midline shift. No abnormal extra-axial fluid collections. The grey-white matter junction is distinct. Basal cisterns are not effaced. No acute intracranial hemorrhage. No depressed skull fractures.  Visualized paranasal sinuses and mastoid air cells are not opacified.  Tiny retention cyst in the right maxillary antrum.  IMPRESSION: No acute intracranial abnormalities.  Original Report Authenticated By: Marlon Pel, M.D.     No diagnosis found.    MDM   Patient with a history of gastric bypass in here today complaining of nausea vomiting and diarrhea for the last 2 days. She states she's been unable to hold anything down in the last 24-48 hours. Also she states she had an episode of vomiting last night where she had near syncope as well as feeling mildly confused. Patient has epigastric pain on exam but  has no signs of confusion or other abnormalities.  The patient doesn't appear dehydrated on exam. UPT and UA within normal limits. CBC, CMP, lipase, lactic acid, chest x-ray,, head CT right forearm films pending.  Chest x-ray done 2 to rhonchi on exam and persistent cough. CT done due to patient stating she hit her head she had near syncope yesterday and is having a terrible headache  11:14 PM All labs wnl.  Plain films are neg.  On re-eval pt improved.  No signs of cholecystitis and pt did just recently get off of doxy which is probably exacerbating sx.  Tolerating po's and will d/c home.      Gwyneth Sprout, MD 07/26/11 [phone]

## 2011-07-26 NOTE — ED Notes (Signed)
Pt given water to drink for PO challenge. 

## 2011-07-26 NOTE — ED Notes (Signed)
Patient transported to CT and XR 

## 2011-07-26 NOTE — ED Notes (Signed)
Return from radiology. Reports unchanged pain after morphine given. EDP made aware.

## 2011-07-27 MED ORDER — ONDANSETRON HCL 4 MG/2ML IJ SOLN
4.0000 mg | Freq: Once | INTRAMUSCULAR | Status: AC
  Administered 2011-07-27: 4 mg via INTRAVENOUS
  Filled 2011-07-27: qty 2

## 2011-07-27 NOTE — ED Notes (Signed)
Pt states after sipping water, her head began hurting again, however denies having vomited. EDP aware.

## 2011-07-27 NOTE — ED Notes (Signed)
Pt requesting 2nd dose of zofran prior to dc. Will notify EDP

## 2012-03-18 ENCOUNTER — Emergency Department (HOSPITAL_BASED_OUTPATIENT_CLINIC_OR_DEPARTMENT_OTHER)
Admission: EM | Admit: 2012-03-18 | Discharge: 2012-03-18 | Disposition: A | Discharge status: Home / Self Care | Payer: Self-pay | Attending: Emergency Medicine | Admitting: Emergency Medicine

## 2012-03-18 ENCOUNTER — Encounter (HOSPITAL_BASED_OUTPATIENT_CLINIC_OR_DEPARTMENT_OTHER): Payer: Self-pay | Admitting: Family Medicine

## 2012-03-18 DIAGNOSIS — Z8614 Personal history of Methicillin resistant Staphylococcus aureus infection: Secondary | ICD-10-CM | POA: Insufficient documentation

## 2012-03-18 DIAGNOSIS — Z862 Personal history of diseases of the blood and blood-forming organs and certain disorders involving the immune mechanism: Secondary | ICD-10-CM | POA: Insufficient documentation

## 2012-03-18 DIAGNOSIS — F17200 Nicotine dependence, unspecified, uncomplicated: Secondary | ICD-10-CM | POA: Insufficient documentation

## 2012-03-18 DIAGNOSIS — Z8659 Personal history of other mental and behavioral disorders: Secondary | ICD-10-CM | POA: Insufficient documentation

## 2012-03-18 DIAGNOSIS — R112 Nausea with vomiting, unspecified: Secondary | ICD-10-CM | POA: Insufficient documentation

## 2012-03-18 DIAGNOSIS — R51 Headache: Secondary | ICD-10-CM | POA: Insufficient documentation

## 2012-03-18 DIAGNOSIS — Z9884 Bariatric surgery status: Secondary | ICD-10-CM | POA: Insufficient documentation

## 2012-03-18 DIAGNOSIS — L0201 Cutaneous abscess of face: Secondary | ICD-10-CM | POA: Insufficient documentation

## 2012-03-18 DIAGNOSIS — L03211 Cellulitis of face: Secondary | ICD-10-CM | POA: Insufficient documentation

## 2012-03-18 MED ORDER — SULFAMETHOXAZOLE-TRIMETHOPRIM 800-160 MG PO TABS: 1.0000 | ORAL_TABLET | Freq: Two times a day (BID) | ORAL | Status: DC

## 2012-03-18 MED ORDER — CEPHALEXIN 500 MG PO CAPS: ORAL_CAPSULE | ORAL | Status: DC

## 2012-03-18 MED ORDER — OXYCODONE-ACETAMINOPHEN 5-325 MG PO TABS: 2.0000 | ORAL_TABLET | ORAL | Status: DC | PRN

## 2012-03-18 NOTE — ED Notes (Signed)
Pt c/o swelling and reddened area to right nare and above upper lip x 1 day. Pt reports h/o mrsa and abscess.

## 2012-03-18 NOTE — ED Provider Notes (Signed)
History  This chart was scribed for Katherine Horn, MD by Ardeen Jourdain, ED Scribe. This patient was seen in room MH12/MH12 and the patient's care was started at 1930.  CSN: [id number]  Arrival date & time 03/18/12  1810   None     Chief Complaint  Patient presents with  . Abscess      The history is provided by the patient. No language interpreter was used.    MARRION FINAN is a 42 y.o. female with a h/o MRSA and abscesses who presents to the Emergency Department complaining of swollen and reddened area to the right nare and above upper lip that began 1 day ago. She has associated HA, nausea and emesis. She denies any fever, diarrhea, CP and SOB as associated symptoms. She denies any h/o facial abscesses. She states her h/o abscesses are usually in her axilla.    Past Medical History  Diagnosis Date  . ADD (attention deficit disorder)   . Anemia   . Blood transfusion without reported diagnosis     Past Surgical History  Procedure Laterality Date  . Gastric bypass    . Cesarean section    . Breast enhancement surgery      No family history on file.  History  Substance Use Topics  . Smoking status: Current Some Day Smoker  . Smokeless tobacco: Not on file  . Alcohol Use: No   No OB history available.   Review of Systems  10 Systems reviewed and all are negative for acute change except as noted in the HPI.   Allergies  Nsaids; Promethazine hcl; and Tramadol  Home Medications   No current outpatient prescriptions on file.  Triage Vitals: BP 119/84  Pulse 92  Temp(Src) 98 F (36.7 C) (Oral)  Resp 18  Ht 5\' 5"  (1.651 m)  Wt 112 lb (50.803 kg)  BMI 18.64 kg/m2  SpO2 100%  LMP 03/11/2012  Physical Exam  Nursing note and vitals reviewed. Constitutional: She appears well-developed and well-nourished. No distress.  Awake, alert, nontoxic appearance.  HENT:  Head: Normocephalic.  Mouth/Throat: Oropharynx is clear and moist. No oropharyngeal exudate.   Dentition normal  Eyes: Right eye exhibits no discharge. Left eye exhibits no discharge.  Neck: Normal range of motion. Neck supple.  No lymphadenopathy   Cardiovascular: Normal rate, regular rhythm and normal heart sounds.  Exam reveals no gallop and no friction rub.   No murmur heard. Pulmonary/Chest: Effort normal and breath sounds normal. No respiratory distress. She has no wheezes. She has no rales. She exhibits no tenderness.  Abdominal: Soft. There is no tenderness. There is no rebound.  Musculoskeletal: She exhibits no tenderness.  Baseline ROM, no obvious new focal weakness.  Neurological:  Mental status and motor strength appears baseline for patient and situation.  Skin: Skin is warm and dry. No rash noted. She is not diaphoretic. There is erythema.  Right philtrum adjacent to the right nares 2 cm diameter area of erythema, induration and tenderness without fluctuance or discrete abscess   Psychiatric: She has a normal mood and affect. Her behavior is normal.    ED Course  Procedures (including critical care time)  DIAGNOSTIC STUDIES: Oxygen Saturation is 100% on room air, normal by my interpretation.    COORDINATION OF CARE:  7:35 PM: Patient / Family / Caregiver informed of clinical course, understand medical decision-making process, and agree with plan.       1. Facial cellulitis       MDM  I doubt any other EMC precluding discharge at this time including, but not necessarily limited to the following:abscess requiring I&D, sepsis.  I personally performed the services described in this documentation, which was scribed in my presence. The recorded information has been reviewed and is accurate.    Katherine Horn, MD 03/20/12 [phone]

## 2012-03-20 ENCOUNTER — Emergency Department (HOSPITAL_BASED_OUTPATIENT_CLINIC_OR_DEPARTMENT_OTHER): Payer: Self-pay

## 2012-03-20 ENCOUNTER — Inpatient Hospital Stay (HOSPITAL_BASED_OUTPATIENT_CLINIC_OR_DEPARTMENT_OTHER)
Admission: EM | Admit: 2012-03-20 | Discharge: 2012-03-23 | DRG: 603 | Disposition: A | Discharge status: Home / Self Care | Payer: Self-pay | Attending: Internal Medicine | Admitting: Internal Medicine

## 2012-03-20 ENCOUNTER — Encounter (HOSPITAL_BASED_OUTPATIENT_CLINIC_OR_DEPARTMENT_OTHER): Payer: Self-pay | Admitting: *Deleted

## 2012-03-20 DIAGNOSIS — Z9884 Bariatric surgery status: Secondary | ICD-10-CM

## 2012-03-20 DIAGNOSIS — F411 Generalized anxiety disorder: Secondary | ICD-10-CM | POA: Diagnosis present

## 2012-03-20 DIAGNOSIS — Z8614 Personal history of Methicillin resistant Staphylococcus aureus infection: Secondary | ICD-10-CM

## 2012-03-20 DIAGNOSIS — E876 Hypokalemia: Secondary | ICD-10-CM | POA: Diagnosis present

## 2012-03-20 DIAGNOSIS — L03211 Cellulitis of face: Principal | ICD-10-CM | POA: Diagnosis present

## 2012-03-20 DIAGNOSIS — F419 Anxiety disorder, unspecified: Secondary | ICD-10-CM

## 2012-03-20 DIAGNOSIS — R599 Enlarged lymph nodes, unspecified: Secondary | ICD-10-CM | POA: Diagnosis present

## 2012-03-20 DIAGNOSIS — F17200 Nicotine dependence, unspecified, uncomplicated: Secondary | ICD-10-CM | POA: Diagnosis present

## 2012-03-20 DIAGNOSIS — F988 Other specified behavioral and emotional disorders with onset usually occurring in childhood and adolescence: Secondary | ICD-10-CM | POA: Diagnosis present

## 2012-03-20 DIAGNOSIS — L0201 Cutaneous abscess of face: Principal | ICD-10-CM | POA: Diagnosis present

## 2012-03-20 DIAGNOSIS — Z79899 Other long term (current) drug therapy: Secondary | ICD-10-CM

## 2012-03-20 DIAGNOSIS — A4902 Methicillin resistant Staphylococcus aureus infection, unspecified site: Secondary | ICD-10-CM | POA: Diagnosis present

## 2012-03-20 HISTORY — DX: Anxiety disorder, unspecified: F41.9

## 2012-03-20 HISTORY — DX: Encounter for other specified aftercare: Z51.89

## 2012-03-20 LAB — BASIC METABOLIC PANEL
CO2: 26 mEq/L (ref 19–32)
Calcium: 9.2 mg/dL (ref 8.4–10.5)
Chloride: 98 mEq/L (ref 96–112)
Creatinine, Ser: 0.6 mg/dL (ref 0.50–1.10)
Glucose, Bld: 104 mg/dL — ABNORMAL HIGH (ref 70–99)

## 2012-03-20 LAB — CBC WITH DIFFERENTIAL/PLATELET
Basophils Absolute: 0 10*3/uL (ref 0.0–0.1)
Eosinophils Relative: 1 % (ref 0–5)
HCT: 33 % — ABNORMAL LOW (ref 36.0–46.0)
Hemoglobin: 10.6 g/dL — ABNORMAL LOW (ref 12.0–15.0)
Lymphocytes Relative: 14 % (ref 12–46)
Lymphs Abs: 1.6 10*3/uL (ref 0.7–4.0)
MCV: 83.1 fL (ref 78.0–100.0)
Monocytes Absolute: 0.9 10*3/uL (ref 0.1–1.0)
Neutro Abs: 9.4 10*3/uL — ABNORMAL HIGH (ref 1.7–7.7)
RBC: 3.97 MIL/uL (ref 3.87–5.11)
WBC: 12 10*3/uL — ABNORMAL HIGH (ref 4.0–10.5)

## 2012-03-20 MED ORDER — VANCOMYCIN HCL IN DEXTROSE 1-5 GM/200ML-% IV SOLN
INTRAVENOUS | Status: AC
  Filled 2012-03-20: qty 200

## 2012-03-20 MED ORDER — ONDANSETRON HCL 4 MG PO TABS
4.0000 mg | ORAL_TABLET | Freq: Four times a day (QID) | ORAL | Status: DC | PRN
  Administered 2012-03-22 (×2): 4 mg via ORAL
  Filled 2012-03-20 (×2): qty 1

## 2012-03-20 MED ORDER — VANCOMYCIN HCL 1000 MG IV SOLR
750.0000 mg | Freq: Once | INTRAVENOUS | Status: DC
  Filled 2012-03-20: qty 750

## 2012-03-20 MED ORDER — HYDROMORPHONE HCL PF 1 MG/ML IJ SOLN
1.0000 mg | Freq: Once | INTRAMUSCULAR | Status: AC
  Administered 2012-03-20: 1 mg via INTRAVENOUS
  Filled 2012-03-20: qty 1

## 2012-03-20 MED ORDER — VANCOMYCIN HCL 500 MG IV SOLR
INTRAVENOUS | Status: AC
  Administered 2012-03-20: 750 mg via INTRAVENOUS
  Filled 2012-03-20: qty 1000

## 2012-03-20 MED ORDER — AMPHETAMINE-DEXTROAMPHETAMINE 10 MG PO TABS
30.0000 mg | ORAL_TABLET | Freq: Every day | ORAL | Status: DC
  Administered 2012-03-21: 30 mg via ORAL
  Filled 2012-03-20: qty 3

## 2012-03-20 MED ORDER — HYDROMORPHONE HCL PF 1 MG/ML IJ SOLN
0.5000 mg | INTRAMUSCULAR | Status: DC | PRN
  Administered 2012-03-21 – 2012-03-23 (×13): 1 mg via INTRAVENOUS
  Filled 2012-03-20 (×13): qty 1

## 2012-03-20 MED ORDER — ONDANSETRON HCL 4 MG/2ML IJ SOLN: 4.0000 mg | Freq: Three times a day (TID) | INTRAMUSCULAR | Status: DC | PRN

## 2012-03-20 MED ORDER — OXYCODONE-ACETAMINOPHEN 5-325 MG PO TABS
2.0000 | ORAL_TABLET | ORAL | Status: DC | PRN
  Administered 2012-03-20 – 2012-03-23 (×14): 2 via ORAL
  Filled 2012-03-20 (×14): qty 2

## 2012-03-20 MED ORDER — SODIUM CHLORIDE 0.9 % IV SOLN: INTRAVENOUS | Status: AC

## 2012-03-20 MED ORDER — ACETAMINOPHEN 325 MG PO TABS: 650.0000 mg | ORAL_TABLET | Freq: Four times a day (QID) | ORAL | Status: DC | PRN

## 2012-03-20 MED ORDER — SERTRALINE HCL 50 MG PO TABS
50.0000 mg | ORAL_TABLET | Freq: Every day | ORAL | Status: DC
  Administered 2012-03-21 – 2012-03-23 (×3): 50 mg via ORAL
  Filled 2012-03-20 (×4): qty 1

## 2012-03-20 MED ORDER — ONDANSETRON HCL 4 MG/2ML IJ SOLN
4.0000 mg | Freq: Four times a day (QID) | INTRAMUSCULAR | Status: DC | PRN
  Administered 2012-03-21 – 2012-03-23 (×4): 4 mg via INTRAVENOUS
  Filled 2012-03-20 (×4): qty 2

## 2012-03-20 MED ORDER — DOCUSATE SODIUM 100 MG PO CAPS
100.0000 mg | ORAL_CAPSULE | Freq: Two times a day (BID) | ORAL | Status: DC
  Administered 2012-03-20 – 2012-03-23 (×5): 100 mg via ORAL
  Filled 2012-03-20 (×5): qty 1

## 2012-03-20 MED ORDER — ONDANSETRON HCL 4 MG/2ML IJ SOLN
4.0000 mg | Freq: Once | INTRAMUSCULAR | Status: AC
  Administered 2012-03-20: 4 mg via INTRAVENOUS
  Filled 2012-03-20: qty 2

## 2012-03-20 MED ORDER — IOHEXOL 300 MG/ML  SOLN
100.0000 mL | Freq: Once | INTRAMUSCULAR | Status: AC | PRN
  Administered 2012-03-20: 100 mL via INTRAVENOUS

## 2012-03-20 MED ORDER — AMPHETAMINE-DEXTROAMPHETAMINE 30 MG PO TABS: 30.0000 mg | ORAL_TABLET | Freq: Every day | ORAL | Status: DC

## 2012-03-20 MED ORDER — VANCOMYCIN HCL IN DEXTROSE 1-5 GM/200ML-% IV SOLN
1000.0000 mg | Freq: Two times a day (BID) | INTRAVENOUS | Status: DC
  Administered 2012-03-21 – 2012-03-23 (×4): 1000 mg via INTRAVENOUS
  Filled 2012-03-20 (×7): qty 200

## 2012-03-20 MED ORDER — ACETAMINOPHEN 650 MG RE SUPP: 650.0000 mg | Freq: Four times a day (QID) | RECTAL | Status: DC | PRN

## 2012-03-20 NOTE — ED Notes (Signed)
Was seen a few days ago for abcess and has been taking antibiotics and pain medicine. States that shes been doing the warm compresses as instructed. Face is getting worse

## 2012-03-20 NOTE — Progress Notes (Signed)
ANTIBIOTIC CONSULT NOTE - INITIAL  Pharmacy Consult for Vancomycin Indication: cellulitis  Allergies  Allergen Reactions  . Nsaids     Anemia, and Gastric bypass  . Promethazine Hcl   . Tramadol Rash    Patient Measurements: Height: 5\' 5"  (165.1 cm) Weight: 112 lb (50.803 kg) IBW/kg (Calculated) : 57  Vital Signs: Temp: 98.5 F (36.9 C) (03/12 2232) Temp src: Oral (03/12 2232) BP: 105/67 mmHg (03/12 2232) Pulse Rate: 79 (03/12 2232)  Labs:  Recent Labs  03/20/12 1800  WBC 12.0*  HGB 10.6*  PLT 417*  CREATININE 0.60   Estimated Creatinine Clearance: 74.2 ml/min (by C-G formula based on Cr of 0.6). No results found for this basename: VANCOTROUGH, VANCOPEAK, VANCORANDOM, GENTTROUGH, GENTPEAK, GENTRANDOM, TOBRATROUGH, TOBRAPEAK, TOBRARND, AMIKACINPEAK, AMIKACINTROU, AMIKACIN,  in the last 72 hours   Microbiology: No results found for this or any previous visit (from the past 720 hour(s)).  Medical History: Past Medical History  Diagnosis Date  . ADD (attention deficit disorder)   . Anemia   . Blood transfusion without reported diagnosis     Medications:  Prescriptions prior to admission  Medication Sig Dispense Refill  . ALPRAZolam (XANAX) 0.5 MG tablet Take 1 mg by mouth at bedtime as needed for sleep.      Marland Kitchen amphetamine-dextroamphetamine (ADDERALL) 30 MG tablet Take 30 mg by mouth 2 (two) times daily.       Marland Kitchen oxyCODONE-acetaminophen (PERCOCET/ROXICET) 5-325 MG per tablet Take 2 tablets by mouth every 4 (four) hours as needed for pain.      . ranitidine (ZANTAC) 150 MG tablet Take 150 mg by mouth 2 (two) times daily as needed for heartburn.      . sertraline (ZOLOFT) 50 MG tablet Take 50 mg by mouth daily.      Marland Kitchen sulfamethoxazole-trimethoprim (BACTRIM DS) 800-160 MG per tablet Take 1 tablet by mouth 2 (two) times daily.      . [DISCONTINUED] oxyCODONE-acetaminophen (PERCOCET) 5-325 MG per tablet Take 2 tablets by mouth every 4 (four) hours as needed for pain.   20 tablet  0  . cephALEXin (KEFLEX) 500 MG capsule Take 1,000 mg by mouth 2 (two) times daily.       Assessment: 42 yo female with facial swelling/cellulitis for empiric antibiotics.  Vancomycin 750 mg IV given at [facility] at 1900  Goal of Therapy:  Vancomycin trough level 10-15 mcg/ml  Plan:  Vancomycin 1 g IV q12h  Eddie Candle 03/20/2012,10:43 PM

## 2012-03-20 NOTE — ED Provider Notes (Signed)
History     CSN: [id number]  Arrival date & time 03/20/12  1550   First MD Initiated Contact with Patient 03/20/12 1733      Chief Complaint  Patient presents with  . Facial Swelling    (Consider location/radiation/quality/duration/timing/severity/associated sxs/prior treatment) HPI Comments: Patient presents for abscess on right side of her face around the base of her nose. Patient was seen 2 days ago by Dr. Fonnie Jarvis who treated the patient for cellulitis of the face with Keflex and Bactrim. Patient was also given prescription for Percocet to take as needed for the pain. Patient states she has been compliant in taking all of her antibiotics and states that the swelling has worsened extending towards her right cheek bone and down towards the base of her jaw. Patient denies fevers, vision changes, difficulty swallowing or inability to open jaw, ear pain or discharge, chest pain, and shortness of breath. Patient states pain is only mildly controlled with Percocet; patient states she has had to take Percocet every 4 hours for pain management.  The history is provided by the patient. No language interpreter was used.    Past Medical History  Diagnosis Date  . ADD (attention deficit disorder)   . Anemia     Past Surgical History  Procedure Laterality Date  . Gastric bypass    . Cesarean section    . Breast enhancement surgery      No family history on file.  History  Substance Use Topics  . Smoking status: Current Some Day Smoker  . Smokeless tobacco: Not on file  . Alcohol Use: No    OB History   Grav Para Term Preterm Abortions TAB SAB Ect Mult Living                  Review of Systems  Constitutional: Negative for fever and chills.  HENT: Negative for hearing loss, trouble swallowing, neck stiffness and ear discharge.   Eyes: Negative for pain, redness and visual disturbance.  Respiratory: Negative for shortness of breath.   Cardiovascular: Negative for chest pain.    Gastrointestinal: Negative for nausea, vomiting, abdominal pain and diarrhea.  Skin: Positive for color change. Negative for rash.  Neurological: Negative for syncope and headaches.  All other systems reviewed and are negative.    Allergies  Nsaids; Promethazine hcl; and Tramadol  Home Medications   Current Outpatient Rx  Name  Route  Sig  Dispense  Refill  . acetaminophen (TYLENOL) 500 MG tablet   Oral   Take 1,000 mg by mouth every 6 (six) hours as needed. Patient used this medication for her headache.         . ALPRAZolam (XANAX) 1 MG tablet   Oral   Take 1 mg by mouth at bedtime as needed. Patient uses 0.5 milligrams of this medication.         Marland Kitchen amphetamine-dextroamphetamine (ADDERALL) 30 MG tablet   Oral   Take 30 mg by mouth daily.         . cephALEXin (KEFLEX) 500 MG capsule      2 caps po bid x 7 days   28 capsule   0   . oxyCODONE-acetaminophen (PERCOCET) 5-325 MG per tablet   Oral   Take 2 tablets by mouth every 4 (four) hours as needed for pain.   20 tablet   0   . sertraline (ZOLOFT) 50 MG tablet   Oral   Take 50 mg by mouth daily.         Marland Kitchen  sulfamethoxazole-trimethoprim (BACTRIM DS,SEPTRA DS) 800-160 MG per tablet   Oral   Take 1 tablet by mouth 2 (two) times daily. One po bid x 7 days   14 tablet   0     BP 99/50  Pulse 80  Temp(Src) 98.6 F (37 C) (Oral)  Resp 18  Ht 5\' 5"  (1.651 m)  Wt 112 lb (50.803 kg)  BMI 18.64 kg/m2  SpO2 98%  LMP 03/11/2012  Physical Exam  Nursing note and vitals reviewed. Constitutional: She is oriented to person, place, and time. She appears well-developed and well-nourished. No distress.  HENT:  Head: Normocephalic and atraumatic. Head is without raccoon's eyes, without Battle's sign, without laceration, without right periorbital erythema and without left periorbital erythema.    Right Ear: External ear normal.  Left Ear: External ear normal.  Mouth/Throat: Oropharynx is clear and moist. No  oropharyngeal exudate.  Blanching erythema appreciated extending from right base of nose and around right nostril towards end of cheek bone and inferiorly down cheek. 2 cm area of induration around right nostril without central area of fluctuance. Small pinpoint area consistent with a pustular head appreciated just lateral to patient's right nostril. Area exquisitely tender to palpation. No active drainage.  Eyes: Conjunctivae and EOM are normal. Pupils are equal, round, and reactive to light. Right eye exhibits no discharge. Left eye exhibits no discharge. No scleral icterus.  Neck: Normal range of motion. Neck supple.  Cardiovascular: Normal rate, regular rhythm and normal heart sounds.   Pulmonary/Chest: Effort normal and breath sounds normal. No respiratory distress. She has no wheezes. She has no rales.  Musculoskeletal: Normal range of motion. She exhibits no edema.  Lymphadenopathy:    She has cervical adenopathy.  Neurological: She is alert and oriented to person, place, and time.  Sensation of the face intact.  Skin: Skin is warm and dry. No rash noted. She is not diaphoretic. There is erythema.  Psychiatric: She has a normal mood and affect. Her behavior is normal.    ED Course  Procedures (including critical care time)  Labs Reviewed  CBC WITH DIFFERENTIAL - Abnormal; Notable for the following:    WBC 12.0 (*)    Hemoglobin 10.6 (*)    HCT 33.0 (*)    Platelets 417 (*)    Neutrophils Relative 78 (*)    Neutro Abs 9.4 (*)    All other components within normal limits  BASIC METABOLIC PANEL - Abnormal; Notable for the following:    Potassium 3.3 (*)    Glucose, Bld 104 (*)    All other components within normal limits   Ct Maxillofacial W/cm  03/20/2012  *RADIOLOGY REPORT*  Clinical Data:  Right facial abscess below nose, facial redness and swelling for 3 days  CT MAXILLOFACIAL WITH CONTRAST  Technique:  Multidetector CT imaging of the maxillofacial structures was performed  with intravenous contrast. Multiplanar CT image reconstructions were also generated.  Right side of face marked with a vitamin E capsule.  Contrast: OMNIPAQUE IOHEXOL 300 MG/ML  SOLN  Comparison: Head CT 07/26/2011  Findings: Visualized intracranial contents normal appearance. Symmetric normal-appearing intraorbital soft tissue planes. Soft tissue swelling is identified anterior to the right maxillary sinus, beginning at the infraorbital region extending to the upper lip compatible with infection/cellulitis. A focal area of low attenuation is seen anteriorly which could represent a small subcutaneous fluid/abscess collection, 17 x 9 x 10 mm in size. No additional abnormal fluid collections identified. No soft tissue swelling extends to  the lateral aspect of the right face. Nasal septal deviation to the right. Paranasal sinuses clear. Mildly enlarged lymph node medial to the angle of mandible. Additional normal to minimally prominent anterior cervical and submandibular lymph nodes are seen bilaterally. No facial bony abnormalities identified.  IMPRESSION: Soft tissue swelling thickening right premaxillary extending to the right lateral face consistent with cellulitis. Small focal fluid collection 17 x 9 x 10 mm in size is identified anterior and right maxillary sinus lateral to the base of the nose question small subcutaneous abscess.   Original Report Authenticated By: Ulyses Southward, M.D.      1. Facial cellulitis      MDM  Patient presents for right-sided facial swelling. Patient was evaluated in the ED 2 days ago and discharged with a prescription for Bactrim and Keflex area patient has been taking this medication as prescribed without symptom relief and states that the swelling has worsened over the last 48 hours. Workup today to include labs as well as CT maxillofacial with contrast. Patient will also be started on IV vancomycin and will be given IV Dilaudid for pain and Zofran for nausea.  Patient  states her pain is improved with IV Dilaudid. Patient has failed treatment with oral antibiotics as an outpatient. She will be admitted with the diagnosis of facial cellulitis for further evaluation and treatment with IV antibiotics. Patient states comfort and understanding of this management plan. Patient seen by Dr. Manus Gunning with whom the workup and management plan was discussed.      Antony Madura, PA-C 03/22/12 2030

## 2012-03-21 ENCOUNTER — Encounter (HOSPITAL_COMMUNITY): Payer: Self-pay | Admitting: Neurology

## 2012-03-21 DIAGNOSIS — F419 Anxiety disorder, unspecified: Secondary | ICD-10-CM | POA: Insufficient documentation

## 2012-03-21 DIAGNOSIS — L0201 Cutaneous abscess of face: Principal | ICD-10-CM

## 2012-03-21 DIAGNOSIS — F411 Generalized anxiety disorder: Secondary | ICD-10-CM

## 2012-03-21 DIAGNOSIS — L03211 Cellulitis of face: Secondary | ICD-10-CM | POA: Diagnosis present

## 2012-03-21 LAB — CBC
HCT: 29.5 % — ABNORMAL LOW (ref 36.0–46.0)
MCV: 81.3 fL (ref 78.0–100.0)
Platelets: 347 10*3/uL (ref 150–400)
RBC: 3.63 MIL/uL — ABNORMAL LOW (ref 3.87–5.11)
WBC: 9.7 10*3/uL (ref 4.0–10.5)

## 2012-03-21 LAB — MRSA PCR SCREENING: MRSA by PCR: POSITIVE — AB

## 2012-03-21 LAB — COMPREHENSIVE METABOLIC PANEL
Albumin: 3 g/dL — ABNORMAL LOW (ref 3.5–5.2)
BUN: 8 mg/dL (ref 6–23)
Calcium: 8.5 mg/dL (ref 8.4–10.5)
Creatinine, Ser: 0.45 mg/dL — ABNORMAL LOW (ref 0.50–1.10)
Total Bilirubin: 0.3 mg/dL (ref 0.3–1.2)
Total Protein: 6.7 g/dL (ref 6.0–8.3)

## 2012-03-21 MED ORDER — CHLORHEXIDINE GLUCONATE CLOTH 2 % EX PADS
6.0000 | MEDICATED_PAD | Freq: Every day | CUTANEOUS | Status: DC
  Administered 2012-03-21 – 2012-03-23 (×3): 6 via TOPICAL

## 2012-03-21 MED ORDER — ALPRAZOLAM 0.5 MG PO TABS
1.0000 mg | ORAL_TABLET | Freq: Every evening | ORAL | Status: DC | PRN
  Administered 2012-03-21: 1 mg via ORAL
  Filled 2012-03-21 (×3): qty 1

## 2012-03-21 MED ORDER — FAMOTIDINE 20 MG PO TABS
20.0000 mg | ORAL_TABLET | Freq: Every day | ORAL | Status: DC
  Administered 2012-03-21 – 2012-03-23 (×3): 20 mg via ORAL
  Filled 2012-03-21 (×3): qty 1

## 2012-03-21 MED ORDER — NICOTINE 14 MG/24HR TD PT24
14.0000 mg | MEDICATED_PATCH | Freq: Every day | TRANSDERMAL | Status: DC
  Administered 2012-03-23: 14 mg via TRANSDERMAL
  Filled 2012-03-21 (×4): qty 1

## 2012-03-21 MED ORDER — AMPHETAMINE-DEXTROAMPHETAMINE 10 MG PO TABS: 30.0000 mg | ORAL_TABLET | Freq: Two times a day (BID) | ORAL | Status: DC

## 2012-03-21 MED ORDER — PIPERACILLIN-TAZOBACTAM 3.375 G IVPB
3.3750 g | Freq: Three times a day (TID) | INTRAVENOUS | Status: DC
  Administered 2012-03-21 – 2012-03-23 (×6): 3.375 g via INTRAVENOUS
  Filled 2012-03-21 (×8): qty 50

## 2012-03-21 MED ORDER — MUPIROCIN 2 % EX OINT
1.0000 "application " | TOPICAL_OINTMENT | Freq: Two times a day (BID) | CUTANEOUS | Status: DC
  Administered 2012-03-21 – 2012-03-23 (×5): 1 via NASAL
  Filled 2012-03-21 (×2): qty 22

## 2012-03-21 MED ORDER — LIDOCAINE-EPINEPHRINE 1 %-1:100000 IJ SOLN
20.0000 mL | Freq: Once | INTRAMUSCULAR | Status: DC
  Filled 2012-03-21: qty 20

## 2012-03-21 MED ORDER — AMPHETAMINE-DEXTROAMPHETAMINE 10 MG PO TABS
30.0000 mg | ORAL_TABLET | Freq: Two times a day (BID) | ORAL | Status: DC
  Administered 2012-03-21 – 2012-03-23 (×4): 30 mg via ORAL
  Filled 2012-03-21 (×4): qty 3

## 2012-03-21 NOTE — Procedures (Signed)
Preop diagnosis: Right facial abscess Postop diagnosis: Same Procedure: Incision and drainage of right facial abscess Surgeon: Jenne Pane Anesth: Local with 1% lidocaine Compl: none Indications: 42 year old female with several days of worsening right facial swelling and pain.  CT indicates fluid collection in right upper lip.  History of three previous MRSA infections. Findings: Abscess cavity probed.  No frank pus but cavity filled with necrotic material that was debrided.  Culture taken from cavity. Description: After discussing risks, benefits, and alternatives, the patient was placed in a supine position with head elevated.  The right superior gingivobuccal sulcus was injected with local anesthetic.  After a few minutes, additional anesthetic was injected deep to the abscess and superior to the abscess to create a block.  The right lower face was prepped and draped in sterile fashion.  The pustule was unroofed and explored with hemostats.  The opening was widened to each side slightly with an 11 blade scalpel.  The cavity was probed with the hemostat and necrotic tissue debrided.  A culture swab was rubbed in the cavity and sent for culture.  The cavity was copiously irrigated with saline.  A strip of 1/4 inch iodoform gauze was packed into the cavity.  The patient was cleaned off and returned to nursing care in stable condition.

## 2012-03-21 NOTE — Progress Notes (Signed)
UR completed 

## 2012-03-21 NOTE — Progress Notes (Signed)
Patient ID: Katherine [name]  female  ZOX:[id number]    DOB: 1970-06-19    DOA: 03/20/2012  PCP: No primary provider on file.  Assessment/Plan: Principal Problem:   Facial cellulitis with abscess: Failure to outpatient Bactrim and Keflex - CT maxillofacial showed small fluid collection in anterior and right maxillary sinus, ? small subcutaneous abscess - Continue IV vancomycin, added Zosyn - ENT consultation called (Dr Jenne Pane) - Continue pain control  Anxiety:  -continue home Zoloft and xanax  DVT Prophylaxis:  Code Status:  Disposition:    Subjective: Patient anxious about patient cellulitis, states pain is better controlled now  Objective: Weight change:   Intake/Output Summary (Last 24 hours) at 03/21/12 1436 Last data filed at 03/21/12 0900  Gross per 24 hour  Intake   1200 ml  Output   1400 ml  Net   -200 ml   Blood pressure 114/65, pulse 82, temperature 98.2 F (36.8 C), temperature source Oral, resp. rate 20, height 5\' 5"  (1.651 m), weight 50.803 kg (112 lb), last menstrual period 03/11/2012, SpO2 100.00%.  Physical Exam: General: Alert and awake, oriented x3, feels miserable HEENT: anicteric sclera, pupils reactive to light and accommodation, EOMI, significant erythema and swelling on the right cheek under the nose, lesion with yellowish discoloration above the right lip  CVS: S1-S2 clear, no murmur rubs or gallops Chest: clear to auscultation bilaterally, no wheezing, rales or rhonchi Abdomen: soft nontender, nondistended, normal bowel sounds,  Extremities: no cyanosis, clubbing or edema noted bilaterally Neuro: Cranial nerves II-XII intact, no focal neurological deficits  Lab Results: Basic Metabolic Panel:  Recent Labs Lab 03/20/12 1800 03/21/12 0600  NA 136 135  K 3.3* 3.1*  CL 98 99  CO2 26 25  GLUCOSE 104* 93  BUN 9 8  CREATININE 0.60 0.45*  CALCIUM 9.2 8.5  MG  --  2.0  PHOS  --  3.7   Liver Function Tests:  Recent Labs Lab 03/21/12 0600   AST 14  ALT 8  ALKPHOS 106  BILITOT 0.3  PROT 6.7  ALBUMIN 3.0*   No results found for this basename: LIPASE, AMYLASE,  in the last 168 hours No results found for this basename: AMMONIA,  in the last 168 hours CBC:  Recent Labs Lab 03/20/12 1800 03/21/12 0600  WBC 12.0* 9.7  NEUTROABS 9.4*  --   HGB 10.6* 9.7*  HCT 33.0* 29.5*  MCV 83.1 81.3  PLT 417* 347   Cardiac Enzymes: No results found for this basename: CKTOTAL, CKMB, CKMBINDEX, TROPONINI,  in the last 168 hours BNP: No components found with this basename: POCBNP,  CBG: No results found for this basename: GLUCAP,  in the last 168 hours   Micro Results: Recent Results (from the past 240 hour(s))  MRSA PCR SCREENING     Status: Abnormal   Collection Time    03/20/12 10:59 PM      Result Value Range Status   MRSA by PCR POSITIVE (*) NEGATIVE Final   Comment:            The GeneXpert MRSA Assay (FDA     approved for NASAL specimens     only), is one component of a     comprehensive MRSA colonization     surveillance program. It is not     intended to diagnose MRSA     infection nor to guide or     monitor treatment for     MRSA infections.     RESULT CALLED  TO, READ BACK BY AND VERIFIED WITH:     ABIERA,L RN [id number] AT 0023 SKEEN,P    Studies/Results: Ct Maxillofacial W/cm  03/20/2012  *RADIOLOGY REPORT*  Clinical Data:  Right facial abscess below nose, facial redness and swelling for 3 days  CT MAXILLOFACIAL WITH CONTRAST  Technique:  Multidetector CT imaging of the maxillofacial structures was performed with intravenous contrast. Multiplanar CT image reconstructions were also generated.  Right side of face marked with a vitamin E capsule.  Contrast: OMNIPAQUE IOHEXOL 300 MG/ML  SOLN  Comparison: Head CT 07/26/2011  Findings: Visualized intracranial contents normal appearance. Symmetric normal-appearing intraorbital soft tissue planes. Soft tissue swelling is identified anterior to the right maxillary  sinus, beginning at the infraorbital region extending to the upper lip compatible with infection/cellulitis. A focal area of low attenuation is seen anteriorly which could represent a small subcutaneous fluid/abscess collection, 17 x 9 x 10 mm in size. No additional abnormal fluid collections identified. No soft tissue swelling extends to the lateral aspect of the right face. Nasal septal deviation to the right. Paranasal sinuses clear. Mildly enlarged lymph node medial to the angle of mandible. Additional normal to minimally prominent anterior cervical and submandibular lymph nodes are seen bilaterally. No facial bony abnormalities identified.  IMPRESSION: Soft tissue swelling thickening right premaxillary extending to the right lateral face consistent with cellulitis. Small focal fluid collection 17 x 9 x 10 mm in size is identified anterior and right maxillary sinus lateral to the base of the nose question small subcutaneous abscess.   Original Report Authenticated By: Ulyses Southward, M.D.     Medications: Scheduled Meds: . sodium chloride   Intravenous STAT  . amphetamine-dextroamphetamine  30 mg Oral Daily  . Chlorhexidine Gluconate Cloth  6 each Topical Q0600  . docusate sodium  100 mg Oral BID  . mupirocin ointment  1 application Nasal BID  . nicotine  14 mg Transdermal Q0600  . piperacillin-tazobactam (ZOSYN)  IV  3.375 g Intravenous Q8H  . sertraline  50 mg Oral Daily  . vancomycin  1,000 mg Intravenous Q12H      LOS: 1 day   RAI,RIPUDEEP M.D. [facility] 03/21/2012, 2:36 PM Pager: [phone]  If 7PM-7AM, please contact night-coverage [email or web address] Password [email or web address]

## 2012-03-21 NOTE — H&P (Signed)
Patient have been seen and examined agree with above note. 1. Facial cellulitis - Patient tested positive for MRSA. Will continue with IV Vancomycin.  Small abscess formation noted this may need to be drained by a surgical service.  2. Hypokalemia - would replace and follow.

## 2012-03-21 NOTE — H&P (Signed)
Katherine [name] is an 42 y.o. female.   Chief Complaint: facial swelling, pain and redness HPI: Katherine [name] is a 42 yo WF with a PMHx of anxiety/panic disorder, ADD, and anemia with blood transfusions in the past. She presented to the [facility] ED today with c/o right facial pain, swelling, heat, and redness with a "lesion" on her right cheek and in the right nares.  Briley states she began to feel "funny" in her face over the weekend (Sunday) with a "little soreness" with washing her face. Then, the next am, she awoke with swelling and pain in the right cheek area which later spread laterally and towards the jaw. She was seen at ED in [place] on Monday and was given Keflex and Bactrim for facial cellulitis which she has been compliant with. Also, Percocet for pain which is not helping much as of today. Today her face began to hurt worse, get more red and swollen, so she returned to the ED.  She does have a hx of an sebaceous cyst excision from right axilla which was positive for MRSA.  The diagnosis of facial cellulitis/abscess was made. A CT was performed which showed soft tissue swelling consistent with cellulitis, clear sinuses, and anterior cervical lymphadenopathy. Therefore, because of her advancing infection and failure of outpt antibiotics and tx, [facility] will admit for treatment and further evaluation.   Past Medical History  Diagnosis Date  . ADD (attention deficit disorder)   . Anemia   . Blood transfusion without reported diagnosis   . Anxiety disorder     panic  . Anxiety     Past Surgical History  Procedure Laterality Date  . Gastric bypass    . Cesarean section    . Breast enhancement surgery     Fmhx: mother with HTN. Maternal grandmother deceased with breast cancer. Paternal grandfather deceased with CAD, MI.  Family History  Problem Relation Age of Onset  . Other Other   . Other Other    Social History:  reports that she has been smoking Cigarettes.  She has a 15  pack-year smoking history. She does not have any smokeless tobacco history on file. She reports that she does not drink alcohol or use illicit drugs. Divorced, One son who is 59 yrs old.   Allergies:  Allergies  Allergen Reactions  . Nsaids     Anemia, and Gastric bypass  . Promethazine Hcl   . Tramadol Rash    Medications Prior to Admission  Medication Sig Dispense Refill  . ALPRAZolam (XANAX) 0.5 MG tablet Take 1 mg by mouth at bedtime as needed for sleep.      Marland Kitchen amphetamine-dextroamphetamine (ADDERALL) 30 MG tablet Take 30 mg by mouth 2 (two) times daily.       Marland Kitchen oxyCODONE-acetaminophen (PERCOCET/ROXICET) 5-325 MG per tablet Take 2 tablets by mouth every 4 (four) hours as needed for pain.      . ranitidine (ZANTAC) 150 MG tablet Take 150 mg by mouth 2 (two) times daily as needed for heartburn.      . sertraline (ZOLOFT) 50 MG tablet Take 50 mg by mouth daily.      Marland Kitchen sulfamethoxazole-trimethoprim (BACTRIM DS) 800-160 MG per tablet Take 1 tablet by mouth 2 (two) times daily.      . [DISCONTINUED] oxyCODONE-acetaminophen (PERCOCET) 5-325 MG per tablet Take 2 tablets by mouth every 4 (four) hours as needed for pain.  20 tablet  0  . cephALEXin (KEFLEX) 500 MG capsule Take 1,000 mg  by mouth 2 (two) times daily.        Results for orders placed during the hospital encounter of 03/20/12 (from the past 48 hour(s))  CBC WITH DIFFERENTIAL     Status: Abnormal   Collection Time    03/20/12  6:00 PM      Result Value Range   WBC 12.0 (*) 4.0 - 10.5 K/uL   RBC 3.97  3.87 - 5.11 MIL/uL   Hemoglobin 10.6 (*) 12.0 - 15.0 g/dL   HCT 16.1 (*) 09.6 - 04.5 %   MCV 83.1  78.0 - 100.0 fL   MCH 26.7  26.0 - 34.0 pg   MCHC 32.1  30.0 - 36.0 g/dL   RDW 40.9  81.1 - 91.4 %   Platelets 417 (*) 150 - 400 K/uL   Neutrophils Relative 78 (*) 43 - 77 %   Neutro Abs 9.4 (*) 1.7 - 7.7 K/uL   Lymphocytes Relative 14  12 - 46 %   Lymphs Abs 1.6  0.7 - 4.0 K/uL   Monocytes Relative 7  3 - 12 %   Monocytes  Absolute 0.9  0.1 - 1.0 K/uL   Eosinophils Relative 1  0 - 5 %   Eosinophils Absolute 0.1  0.0 - 0.7 K/uL   Basophils Relative 0  0 - 1 %   Basophils Absolute 0.0  0.0 - 0.1 K/uL  BASIC METABOLIC PANEL     Status: Abnormal   Collection Time    03/20/12  6:00 PM      Result Value Range   Sodium 136  135 - 145 mEq/L   Potassium 3.3 (*) 3.5 - 5.1 mEq/L   Chloride 98  96 - 112 mEq/L   CO2 26  19 - 32 mEq/L   Glucose, Bld 104 (*) 70 - 99 mg/dL   BUN 9  6 - 23 mg/dL   Creatinine, Ser 7.82  0.50 - 1.10 mg/dL   Calcium 9.2  8.4 - 95.6 mg/dL   GFR calc non Af Amer >90  >90 mL/min   GFR calc Af Amer >90  >90 mL/min   Comment:            The eGFR has been calculated     using the CKD EPI equation.     This calculation has not been     validated in all clinical     situations.     eGFR's persistently     <90 mL/min signify     possible Chronic Kidney Disease.  MRSA PCR SCREENING     Status: Abnormal   Collection Time    03/20/12 10:59 PM      Result Value Range   MRSA by PCR POSITIVE (*) NEGATIVE   Comment:            The GeneXpert MRSA Assay (FDA     approved for NASAL specimens     only), is one component of a     comprehensive MRSA colonization     surveillance program. It is not     intended to diagnose MRSA     infection nor to guide or     monitor treatment for     MRSA infections.     RESULT CALLED TO, READ BACK BY AND VERIFIED WITH:     ABIERA,L RN [phone] AT 0023 SKEEN,P   Ct Maxillofacial W/cm  03/20/2012  *RADIOLOGY REPORT*  Clinical Data:  Right facial abscess below nose, facial redness and swelling for  3 days  CT MAXILLOFACIAL WITH CONTRAST  Technique:  Multidetector CT imaging of the maxillofacial structures was performed with intravenous contrast. Multiplanar CT image reconstructions were also generated.  Right side of face marked with a vitamin E capsule.  Contrast: OMNIPAQUE IOHEXOL 300 MG/ML  SOLN  Comparison: Head CT 07/26/2011  Findings: Visualized  intracranial contents normal appearance. Symmetric normal-appearing intraorbital soft tissue planes. Soft tissue swelling is identified anterior to the right maxillary sinus, beginning at the infraorbital region extending to the upper lip compatible with infection/cellulitis. A focal area of low attenuation is seen anteriorly which could represent a small subcutaneous fluid/abscess collection, 17 x 9 x 10 mm in size. No additional abnormal fluid collections identified. No soft tissue swelling extends to the lateral aspect of the right face. Nasal septal deviation to the right. Paranasal sinuses clear. Mildly enlarged lymph node medial to the angle of mandible. Additional normal to minimally prominent anterior cervical and submandibular lymph nodes are seen bilaterally. No facial bony abnormalities identified.  IMPRESSION: Soft tissue swelling thickening right premaxillary extending to the right lateral face consistent with cellulitis. Small focal fluid collection 17 x 9 x 10 mm in size is identified anterior and right maxillary sinus lateral to the base of the nose question small subcutaneous abscess.   Original Report Authenticated By: Ulyses Southward, M.D.     Review of Systems  Constitutional: Positive for fever and chills. Negative for malaise/fatigue and diaphoresis.  HENT: Positive for neck pain (anterior lymph nodes/neck is sore on right). Negative for ear pain, nosebleeds, congestion, sore throat and ear discharge.   Eyes: Positive for pain (feels like right eye is heavy with facial swelling). Negative for blurred vision, discharge and redness.  Respiratory: Negative for cough, hemoptysis, sputum production, shortness of breath, wheezing and stridor.   Cardiovascular: Negative for chest pain, palpitations and PND.  Gastrointestinal: Positive for nausea and vomiting (x 3 today). Negative for heartburn, abdominal pain and diarrhea.  Genitourinary: Negative for dysuria.  Musculoskeletal: Negative for  myalgias.  Skin: Negative for rash.       Erythema, "pimple" lesion  Neurological: Negative for sensory change, speech change, focal weakness, weakness and headaches.  Endo/Heme/Allergies: Does not bruise/bleed easily.  Psychiatric/Behavioral: Negative for depression. The patient is nervous/anxious (controlled with meds).     Blood pressure 105/67, pulse 79, temperature 98.5 F (36.9 C), temperature source Oral, resp. rate 17, height 5\' 5"  (1.651 m), weight 50.803 kg (112 lb), last menstrual period 03/11/2012, SpO2 100.00%. Physical Exam  Constitutional: She is oriented to person, place, and time. She appears well-developed and well-nourished. No distress.  HENT:  Head: Normocephalic and atraumatic.  Right Ear: External ear normal.  Left Ear: External ear normal.  Mouth/Throat: Oropharynx is clear and moist. No oropharyngeal exudate.  Right nares with small whitish lesion, no active drainage. Right cheek and mandible edema. Erythema noted to right cheek towards nose and under the nose. Lesion with whitish discharge/discoloration to right cheek lateral to nose.   Eyes: Conjunctivae are normal. Pupils are equal, round, and reactive to light. Right eye exhibits no discharge. Left eye exhibits no discharge. No scleral icterus.  No periorbital edema noted. No periorbital erythema.  Neck: Normal range of motion. Neck supple. No thyromegaly present.  Cardiovascular: Normal rate, regular rhythm and normal heart sounds.   Respiratory: Effort normal and breath sounds normal. No stridor. No respiratory distress. She has no wheezes.  GI: Soft. Bowel sounds are normal. She exhibits no distension. There is no  tenderness. There is no rebound.  Genitourinary:  deferred  Musculoskeletal: Normal range of motion. She exhibits no edema and no tenderness.  Lymphadenopathy:    She has cervical adenopathy (right anterior cervical area, submandibular. slightly tender with palpation. ).  Neurological: She is  alert and oriented to person, place, and time. No cranial nerve deficit.  Skin: Skin is warm and dry. She is not diaphoretic. There is erythema (see head and neck exam for comments).  Psychiatric: She has a normal mood and affect. Her behavior is normal. Thought content normal.     Assessment/Plan 1. Right facial cellulitis with likely abscess forming on right cheek near nose. Afebrile at present. Failed outpt treatment. Admit to floor. IVF, IV vanc per pharmacy consult. Pain meds. Zofran if needed. Warm compresses to face. Hopefully, this will clear with IV abx without need for ENT consult. I do not suspect periorbital cellulitis at this point and her sinuses are clear on CT. If spikes fever, get bld cultures. 2. Right anterior cervical lymphadenopathy-as expected given infection. Heat prn. 3. Slight hypokalemia-she did vomit x 3 today, likely culprit. Recheck in am and if low, replete. May improved with IVF.  4. Anxiety disorder/ADD-controlled, cont home meds.  5. LMP 03/11/12, but will do a urine Hcg for confirmation of no pregnancy.  6. Tobacco abuse-nicotine patch prn. Encouraged her to quit.  Rest of plan per orders.   Expected stay of at least 2 days for IV antibiotics discussed with pt.  Dr. Adela Glimpse to see pt and edit/cosign my note.   KIRBY-GRAHAM, KAREN 03/21/2012, 1:00 AM

## 2012-03-21 NOTE — Progress Notes (Signed)
ANTIBIOTIC CONSULT NOTE - INITIAL  Pharmacy Consult for zosyn Indication: facial cellulitis/abscess  Allergies  Allergen Reactions  . Nsaids     Anemia, and Gastric bypass  . Promethazine Hcl   . Tramadol Rash    Patient Measurements: Height: 5\' 5"  (165.1 cm) Weight: 112 lb (50.803 kg) IBW/kg (Calculated) : 57  Vital Signs: Temp: 98.2 F (36.8 C) (03/13 0730) Temp src: Oral (03/13 0730) BP: 114/65 mmHg (03/13 0730) Pulse Rate: 82 (03/13 0730) Intake/Output from previous day: 03/12 0701 - 03/13 0700 In: 960 [P.O.:960] Out: 1400 [Urine:1400] Intake/Output from this shift:    Labs:  Recent Labs  03/20/12 1800 03/21/12 0600  WBC 12.0* 9.7  HGB 10.6* 9.7*  PLT 417* 347  CREATININE 0.60 0.45*   Estimated Creatinine Clearance: 74.2 ml/min (by C-G formula based on Cr of 0.45). No results found for this basename: VANCOTROUGH, Leodis Binet, VANCORANDOM, GENTTROUGH, GENTPEAK, GENTRANDOM, TOBRATROUGH, TOBRAPEAK, TOBRARND, AMIKACINPEAK, AMIKACINTROU, AMIKACIN,  in the last 72 hours   Microbiology: Recent Results (from the past 720 hour(s))  MRSA PCR SCREENING     Status: Abnormal   Collection Time    03/20/12 10:59 PM      Result Value Range Status   MRSA by PCR POSITIVE (*) NEGATIVE Final   Comment:            The GeneXpert MRSA Assay (FDA     approved for NASAL specimens     only), is one component of a     comprehensive MRSA colonization     surveillance program. It is not     intended to diagnose MRSA     infection nor to guide or     monitor treatment for     MRSA infections.     RESULT CALLED TO, READ BACK BY AND VERIFIED WITH:     ABIERA,L RN [phone] AT 0023 SKEEN,P    Medical History: Past Medical History  Diagnosis Date  . ADD (attention deficit disorder)   . Anemia   . Blood transfusion without reported diagnosis   . Anxiety disorder     panic  . Anxiety     Medications:  Anti-infectives   Start     Dose/Rate Route Frequency Ordered Stop   03/21/12 1300  piperacillin-tazobactam (ZOSYN) IVPB 3.375 g     3.375 g 12.5 mL/hr over 240 Minutes Intravenous Every 8 hours 03/21/12 1157     03/21/12 0600  vancomycin (VANCOCIN) IVPB 1000 mg/200 mL premix     1,000 mg 200 mL/hr over 60 Minutes Intravenous Every 12 hours 03/20/12 2259     03/20/12 1845  vancomycin (VANCOCIN) 500 MG powder    Comments:  HENSON, VICKIE: cabinet override      03/20/12 1845 03/20/12 1855   03/20/12 1841  vancomycin (VANCOCIN) 1 GM/200ML IVPB  Status:  Discontinued    Comments:  HENSON, VICKIE: cabinet override      03/20/12 1841 03/20/12 1842   03/20/12 1815  vancomycin (VANCOCIN) 750 mg in sodium chloride 0.9 % 150 mL IVPB  Status:  Discontinued     750 mg 150 mL/hr over 60 Minutes Intravenous  Once 03/20/12 1803 03/20/12 2242     Assessment: 41 yof presented with facial swelling, pain and redness. Initiated on vancomycin for possible facial cellulitis or abscess. She is currently afebrile and WBC is WNL. She is MRSA PCR positive. PTA she was started on keflex + bactrim with no improvement. Now adding zosyn coverage.   Goal of Therapy:  Eradication  of infection  Plan:  1. Zosyn 3.375gm IV Q8H (4 hr inf) 2. Continue vanc as ordered earlier 3. F/u renal fxn, C&S, clinical status  Rumbarger, Drake Leach 03/21/2012,11:57 AM

## 2012-03-21 NOTE — Consult Note (Signed)
Reason for Consult: right facial abscess Referring Physician: Hospitalists  Katherine [name] is an 42 y.o. female.  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  She developed a pustule in the nostril.  She was treated at the ER for facial cellulitis with Keflex and Bactrim.  Yesterday, however, swelling became worse and involved the right upper lip up to her right lower eyelid. Pain was also worse.  She presented back to the ER and CT imaging suggested an abscess.  She was admitted to the hospitalist service and put on vancomycin.  Today, she is feeling somewhat better but also receiving narcotic pain medication.  Swelling is no better and the pustule has become more prominent.  Past Medical History  Diagnosis Date  . ADD (attention deficit disorder)   . Anemia   . Blood transfusion without reported diagnosis   . Anxiety disorder     panic  . Anxiety     Past Surgical History  Procedure Laterality Date  . Gastric bypass    . Cesarean section    . Breast enhancement surgery      Family History  Problem Relation Age of Onset  . Other Other   . Other Other     Social History:  reports that she has been smoking Cigarettes.  She has a 15 pack-year smoking history. She has never used smokeless tobacco. She reports that she does not drink alcohol or use illicit drugs.  Allergies:  Allergies  Allergen Reactions  . Nsaids     Anemia, and Gastric bypass  . Promethazine Hcl   . Tramadol Rash    Medications: I have reviewed the patient's current medications.  Results for orders placed during the hospital encounter of 03/20/12 (from the past 48 hour(s))  CBC WITH DIFFERENTIAL     Status: Abnormal   Collection Time    03/20/12  6:00 PM      Result Value Range   WBC 12.0 (*) 4.0 - 10.5 K/uL   RBC 3.97  3.87 - 5.11  MIL/uL   Hemoglobin 10.6 (*) 12.0 - 15.0 g/dL   HCT 40.9 (*) 81.1 - 91.4 %   MCV 83.1  78.0 - 100.0 fL   MCH 26.7  26.0 - 34.0 pg   MCHC 32.1  30.0 - 36.0 g/dL   RDW 78.2  95.6 - 21.3 %   Platelets 417 (*) 150 - 400 K/uL   Neutrophils Relative 78 (*) 43 - 77 %   Neutro Abs 9.4 (*) 1.7 - 7.7 K/uL   Lymphocytes Relative 14  12 - 46 %   Lymphs Abs 1.6  0.7 - 4.0 K/uL   Monocytes Relative 7  3 - 12 %   Monocytes Absolute 0.9  0.1 - 1.0 K/uL   Eosinophils Relative 1  0 - 5 %   Eosinophils Absolute 0.1  0.0 - 0.7 K/uL   Basophils Relative 0  0 - 1 %   Basophils Absolute 0.0  0.0 - 0.1 K/uL  BASIC METABOLIC PANEL     Status: Abnormal   Collection Time    03/20/12  6:00 PM      Result Value Range   Sodium 136  135 - 145 mEq/L   Potassium 3.3 (*) 3.5 - 5.1 mEq/L   Chloride 98  96 - 112 mEq/L  CO2 26  19 - 32 mEq/L   Glucose, Bld 104 (*) 70 - 99 mg/dL   BUN 9  6 - 23 mg/dL   Creatinine, Ser 4.54  0.50 - 1.10 mg/dL   Calcium 9.2  8.4 - 09.8 mg/dL   GFR calc non Af Amer >90  >90 mL/min   GFR calc Af Amer >90  >90 mL/min   Comment:            The eGFR has been calculated     using the CKD EPI equation.     This calculation has not been     validated in all clinical     situations.     eGFR's persistently     <90 mL/min signify     possible Chronic Kidney Disease.  MRSA PCR SCREENING     Status: Abnormal   Collection Time    03/20/12 10:59 PM      Result Value Range   MRSA by PCR POSITIVE (*) NEGATIVE   Comment:            The GeneXpert MRSA Assay (FDA     approved for NASAL specimens     only), is one component of a     comprehensive MRSA colonization     surveillance program. It is not     intended to diagnose MRSA     infection nor to guide or     monitor treatment for     MRSA infections.     RESULT CALLED TO, READ BACK BY AND VERIFIED WITH:     ABIERA,L RN [phone] AT 0023 SKEEN,P  PREGNANCY, URINE     Status: None   Collection Time    03/21/12  5:56 AM      Result  Value Range   Preg Test, Ur NEGATIVE  NEGATIVE   Comment:            THE SENSITIVITY OF THIS     METHODOLOGY IS >20 mIU/mL.  MAGNESIUM     Status: None   Collection Time    03/21/12  6:00 AM      Result Value Range   Magnesium 2.0  1.5 - 2.5 mg/dL  COMPREHENSIVE METABOLIC PANEL     Status: Abnormal   Collection Time    03/21/12  6:00 AM      Result Value Range   Sodium 135  135 - 145 mEq/L   Potassium 3.1 (*) 3.5 - 5.1 mEq/L   Chloride 99  96 - 112 mEq/L   CO2 25  19 - 32 mEq/L   Glucose, Bld 93  70 - 99 mg/dL   BUN 8  6 - 23 mg/dL   Creatinine, Ser 8.29 (*) 0.50 - 1.10 mg/dL   Calcium 8.5  8.4 - 56.2 mg/dL   Total Protein 6.7  6.0 - 8.3 g/dL   Albumin 3.0 (*) 3.5 - 5.2 g/dL   AST 14  0 - 37 U/L   ALT 8  0 - 35 U/L   Alkaline Phosphatase 106  39 - 117 U/L   Total Bilirubin 0.3  0.3 - 1.2 mg/dL   GFR calc non Af Amer >90  >90 mL/min   GFR calc Af Amer >90  >90 mL/min   Comment:            The eGFR has been calculated     using the CKD EPI equation.     This calculation has not been  validated in all clinical     situations.     eGFR's persistently     <90 mL/min signify     possible Chronic Kidney Disease.  CBC     Status: Abnormal   Collection Time    03/21/12  6:00 AM      Result Value Range   WBC 9.7  4.0 - 10.5 K/uL   RBC 3.63 (*) 3.87 - 5.11 MIL/uL   Hemoglobin 9.7 (*) 12.0 - 15.0 g/dL   HCT 96.0 (*) 45.4 - 09.8 %   MCV 81.3  78.0 - 100.0 fL   MCH 26.7  26.0 - 34.0 pg   MCHC 32.9  30.0 - 36.0 g/dL   RDW 11.9  14.7 - 82.9 %   Platelets 347  150 - 400 K/uL  PHOSPHORUS     Status: None   Collection Time    03/21/12  6:00 AM      Result Value Range   Phosphorus 3.7  2.3 - 4.6 mg/dL  HIV ANTIBODY (ROUTINE TESTING)     Status: None   Collection Time    03/21/12  6:00 AM      Result Value Range   HIV NON REACTIVE  NON REACTIVE    Ct Maxillofacial W/cm  03/20/2012  *RADIOLOGY REPORT*  Clinical Data:  Right facial abscess below nose, facial redness and  swelling for 3 days  CT MAXILLOFACIAL WITH CONTRAST  Technique:  Multidetector CT imaging of the maxillofacial structures was performed with intravenous contrast. Multiplanar CT image reconstructions were also generated.  Right side of face marked with a vitamin E capsule.  Contrast: OMNIPAQUE IOHEXOL 300 MG/ML  SOLN  Comparison: Head CT 07/26/2011  Findings: Visualized intracranial contents normal appearance. Symmetric normal-appearing intraorbital soft tissue planes. Soft tissue swelling is identified anterior to the right maxillary sinus, beginning at the infraorbital region extending to the upper lip compatible with infection/cellulitis. A focal area of low attenuation is seen anteriorly which could represent a small subcutaneous fluid/abscess collection, 17 x 9 x 10 mm in size. No additional abnormal fluid collections identified. No soft tissue swelling extends to the lateral aspect of the right face. Nasal septal deviation to the right. Paranasal sinuses clear. Mildly enlarged lymph node medial to the angle of mandible. Additional normal to minimally prominent anterior cervical and submandibular lymph nodes are seen bilaterally. No facial bony abnormalities identified.  IMPRESSION: Soft tissue swelling thickening right premaxillary extending to the right lateral face consistent with cellulitis. Small focal fluid collection 17 x 9 x 10 mm in size is identified anterior and right maxillary sinus lateral to the base of the nose question small subcutaneous abscess.   Original Report Authenticated By: Ulyses Southward, M.D.     Review of Systems  All other systems reviewed and are negative.   Blood pressure 114/65, pulse 82, temperature 98.2 F (36.8 C), temperature source Oral, resp. rate 20, height 5\' 5"  (1.651 m), weight 50.803 kg (112 lb), last menstrual period 03/11/2012, SpO2 100.00%. Physical Exam  Constitutional: She is oriented to person, place, and time. She appears well-developed and  well-nourished. No distress.  HENT:  Head: Atraumatic.  Right Ear: External ear normal.  Left Ear: External ear normal.  Mouth/Throat: Oropharynx is clear and moist.  Marked edema of right upper lip and surrounding area to right nasal ala with 5 mm pustule just below right nasal ala.  Mild crusting and swelling in inferior right nasal vestibule.  Right upper lip firm and tender.  Eyelids without edema.  Eyes: Conjunctivae and EOM are normal. Pupils are equal, round, and reactive to light.  Neck: Normal range of motion. Neck supple.  Cardiovascular: Normal rate.   Respiratory: Effort normal.  GI:  Did not examine.  Genitourinary:  Did not examine.  Musculoskeletal: Normal range of motion.  Neurological: She is alert and oriented to person, place, and time. No cranial nerve deficit.  Skin: Skin is warm and dry.  Psychiatric: She has a normal mood and affect. Her behavior is normal. Judgment and thought content normal.    Assessment/Plan: Right facial abscess, likely MRSA. I personally reviewed her facial CT showing a small fluid collection in the right upper lip with surrounding inflammatory changes.  Her exam is consistent with these findings.  While IV antibiotics are going to be helpful, I also recommended I&D of the abscess with plan to change iodoform gauze packing.  Risks, benefits, and alternatives were discussed.  This will be performed at the bedside.  A culture will be sent.  BATES, DWIGHT 03/21/2012, 4:30 PM

## 2012-03-22 NOTE — Progress Notes (Signed)
Patient ID: Katherine [name]  female  RUE:[id number]    DOB: 11-22-1970    DOA: 03/20/2012  PCP: No primary provider on file.  Assessment/Plan: Principal Problem:   Facial cellulitis with abscess: Failure to outpatient Bactrim and Keflex s/p I&D, now improving - CT maxillofacial showed small fluid collection in anterior and right maxillary sinus, ? small subcutaneous abscess - Continue IV vancomycin, and Zosyn - Status post I&D, appreciate Dr Dionicio Stall assistance - continue pain control  Anxiety: much better today -continue home Zoloft and xanax  DVT Prophylaxis:  Code Status:  Disposition: over the weekend    Subjective: Patient feels a whole lot relieved after I&D done and facial edema is improving  Objective: Weight change:   Intake/Output Summary (Last 24 hours) at 03/22/12 1412 Last data filed at 03/22/12 0528  Gross per 24 hour  Intake    550 ml  Output      0 ml  Net    550 ml   Blood pressure 102/56, pulse 63, temperature 98.1 F (36.7 C), temperature source Oral, resp. rate 16, height 5\' 5"  (1.651 m), weight 50.803 kg (112 lb), last menstrual period 03/11/2012, SpO2 100.00%.  Physical Exam: General: Alert and awake, oriented x3 HEENT: erythema and swelling on the right cheek under the nose, improving after i&D CVS: S1-S2 clear Chest: CTAB Abdomen: soft NT, ND Extremities: no c/c/e   Lab Results: Basic Metabolic Panel:  Recent Labs Lab 03/20/12 1800 03/21/12 0600  NA 136 135  K 3.3* 3.1*  CL 98 99  CO2 26 25  GLUCOSE 104* 93  BUN 9 8  CREATININE 0.60 0.45*  CALCIUM 9.2 8.5  MG  --  2.0  PHOS  --  3.7   Liver Function Tests:  Recent Labs Lab 03/21/12 0600  AST 14  ALT 8  ALKPHOS 106  BILITOT 0.3  PROT 6.7  ALBUMIN 3.0*   No results found for this basename: LIPASE, AMYLASE,  in the last 168 hours No results found for this basename: AMMONIA,  in the last 168 hours CBC:  Recent Labs Lab 03/20/12 1800 03/21/12 0600  WBC 12.0* 9.7   NEUTROABS 9.4*  --   HGB 10.6* 9.7*  HCT 33.0* 29.5*  MCV 83.1 81.3  PLT 417* 347     Micro Results: Recent Results (from the past 240 hour(s))  MRSA PCR SCREENING     Status: Abnormal   Collection Time    03/20/12 10:59 PM      Result Value Range Status   MRSA by PCR POSITIVE (*) NEGATIVE Final   Comment:            The GeneXpert MRSA Assay (FDA     approved for NASAL specimens     only), is one component of a     comprehensive MRSA colonization     surveillance program. It is not     intended to diagnose MRSA     infection nor to guide or     monitor treatment for     MRSA infections.     RESULT CALLED TO, READ BACK BY AND VERIFIED WITH:     ABIERA,L RN [id number] AT 0023 SKEEN,P  WOUND CULTURE     Status: None   Collection Time    03/21/12  5:48 PM      Result Value Range Status   Specimen Description WOUND   Final   Special Requests NONE SOURCE IS RIGHT UPPER LIP   Final   Gram  Stain     Final   Value: FEW WBC PRESENT,BOTH PMN AND MONONUCLEAR     NO SQUAMOUS EPITHELIAL CELLS SEEN     RARE GRAM POSITIVE COCCI     IN PAIRS   Culture NO GROWTH 1 DAY   Final   Report Status PENDING   Incomplete    Studies/Results: Ct Maxillofacial W/cm  03/20/2012  *RADIOLOGY REPORT*  Clinical Data:  Right facial abscess below nose, facial redness and swelling for 3 days  CT MAXILLOFACIAL WITH CONTRAST  Technique:  Multidetector CT imaging of the maxillofacial structures was performed with intravenous contrast. Multiplanar CT image reconstructions were also generated.  Right side of face marked with a vitamin E capsule.  Contrast: OMNIPAQUE IOHEXOL 300 MG/ML  SOLN  Comparison: Head CT 07/26/2011  Findings: Visualized intracranial contents normal appearance. Symmetric normal-appearing intraorbital soft tissue planes. Soft tissue swelling is identified anterior to the right maxillary sinus, beginning at the infraorbital region extending to the upper lip compatible with  infection/cellulitis. A focal area of low attenuation is seen anteriorly which could represent a small subcutaneous fluid/abscess collection, 17 x 9 x 10 mm in size. No additional abnormal fluid collections identified. No soft tissue swelling extends to the lateral aspect of the right face. Nasal septal deviation to the right. Paranasal sinuses clear. Mildly enlarged lymph node medial to the angle of mandible. Additional normal to minimally prominent anterior cervical and submandibular lymph nodes are seen bilaterally. No facial bony abnormalities identified.  IMPRESSION: Soft tissue swelling thickening right premaxillary extending to the right lateral face consistent with cellulitis. Small focal fluid collection 17 x 9 x 10 mm in size is identified anterior and right maxillary sinus lateral to the base of the nose question small subcutaneous abscess.   Original Report Authenticated By: Ulyses Southward, M.D.     Medications: Scheduled Meds: . amphetamine-dextroamphetamine  30 mg Oral BID WC  . Chlorhexidine Gluconate Cloth  6 each Topical Q0600  . docusate sodium  100 mg Oral BID  . famotidine  20 mg Oral Daily  . lidocaine-EPINEPHrine  20 mL Other Once  . mupirocin ointment  1 application Nasal BID  . nicotine  14 mg Transdermal Q0600  . piperacillin-tazobactam (ZOSYN)  IV  3.375 g Intravenous Q8H  . sertraline  50 mg Oral Daily  . vancomycin  1,000 mg Intravenous Q12H      LOS: 2 days   RAI,RIPUDEEP M.D. [facility] 03/22/2012, 2:12 PM Pager: [phone]  If 7PM-7AM, please contact night-coverage [email or web address] Password [email or web address]

## 2012-03-22 NOTE — Progress Notes (Signed)
Subjective: Facial pain and swelling a bit improved.  Objective: Vital signs in last 24 hours: Temp:  [98.1 F (36.7 C)-98.4 F (36.9 C)] 98.1 F (36.7 C) (03/14 0524) Pulse Rate:  [71-140] 71 (03/14 0524) Resp:  [16-20] 16 (03/14 0524) BP: (101-125)/(55-66) 125/55 mmHg (03/14 0524) SpO2:  [94 %-100 %] 100 % (03/14 0524) Last BM Date: 03/19/12  Intake/Output from previous day: 03/13 0701 - 03/14 0700 In: 790 [P.O.:240; IV Piggyback:550] Out: -  Intake/Output this shift:    General appearance: alert, cooperative and no distress Head: Right facial edema a bit improved, upper lip remains firmly edematous and tender.  Changed iodoform gauze, small cavity remains.  Lab Results:   Recent Labs  03/20/12 1800 03/21/12 0600  WBC 12.0* 9.7  HGB 10.6* 9.7*  HCT 33.0* 29.5*  PLT 417* 347   BMET  Recent Labs  03/20/12 1800 03/21/12 0600  NA 136 135  K 3.3* 3.1*  CL 98 99  CO2 26 25  GLUCOSE 104* 93  BUN 9 8  CREATININE 0.60 0.45*  CALCIUM 9.2 8.5   PT/INR No results found for this basename: LABPROT, INR,  in the last 72 hours ABG No results found for this basename: PHART, PCO2, PO2, HCO3,  in the last 72 hours  Studies/Results: Ct Maxillofacial W/cm  03/20/2012  *RADIOLOGY REPORT*  Clinical Data:  Right facial abscess below nose, facial redness and swelling for 3 days  CT MAXILLOFACIAL WITH CONTRAST  Technique:  Multidetector CT imaging of the maxillofacial structures was performed with intravenous contrast. Multiplanar CT image reconstructions were also generated.  Right side of face marked with a vitamin E capsule.  Contrast: OMNIPAQUE IOHEXOL 300 MG/ML  SOLN  Comparison: Head CT 07/26/2011  Findings: Visualized intracranial contents normal appearance. Symmetric normal-appearing intraorbital soft tissue planes. Soft tissue swelling is identified anterior to the right maxillary sinus, beginning at the infraorbital region extending to the upper lip compatible  with infection/cellulitis. A focal area of low attenuation is seen anteriorly which could represent a small subcutaneous fluid/abscess collection, 17 x 9 x 10 mm in size. No additional abnormal fluid collections identified. No soft tissue swelling extends to the lateral aspect of the right face. Nasal septal deviation to the right. Paranasal sinuses clear. Mildly enlarged lymph node medial to the angle of mandible. Additional normal to minimally prominent anterior cervical and submandibular lymph nodes are seen bilaterally. No facial bony abnormalities identified.  IMPRESSION: Soft tissue swelling thickening right premaxillary extending to the right lateral face consistent with cellulitis. Small focal fluid collection 17 x 9 x 10 mm in size is identified anterior and right maxillary sinus lateral to the base of the nose question small subcutaneous abscess.   Original Report Authenticated By: Ulyses Southward, M.D.     Anti-infectives: Anti-infectives   Start     Dose/Rate Route Frequency Ordered Stop   03/21/12 1300  piperacillin-tazobactam (ZOSYN) IVPB 3.375 g     3.375 g 12.5 mL/hr over 240 Minutes Intravenous Every 8 hours 03/21/12 1157     03/21/12 0600  vancomycin (VANCOCIN) IVPB 1000 mg/200 mL premix     1,000 mg 200 mL/hr over 60 Minutes Intravenous Every 12 hours 03/20/12 2259     03/20/12 1845  vancomycin (VANCOCIN) 500 MG powder    Comments:  HENSON, VICKIE: cabinet override      03/20/12 1845 03/20/12 1855   03/20/12 1841  vancomycin (VANCOCIN) 1 GM/200ML IVPB  Status:  Discontinued    Comments:  HENSON,  VICKIE: cabinet override      03/20/12 1841 03/20/12 1842   03/20/12 1815  vancomycin (VANCOCIN) 750 mg in sodium chloride 0.9 % 150 mL IVPB  Status:  Discontinued     750 mg 150 mL/hr over 60 Minutes Intravenous  Once 03/20/12 1803 03/20/12 2242      Assessment/Plan: s/p I&D of right upper lip abscess, likely MRSA, culture pending. Facial edema is starting to improve post I&D.   Continue iodoform gauze dressing changes until no longer able to pack the wound.  She can be discharged on oral medication when edema is noticeably improved, perhaps clindamycin.  Follow-up with me in one week.  LOS: 2 days    BATES, DWIGHT 03/22/2012

## 2012-03-23 MED ORDER — CLINDAMYCIN HCL 300 MG PO CAPS: 600.0000 mg | ORAL_CAPSULE | Freq: Three times a day (TID) | ORAL | Status: DC

## 2012-03-23 MED ORDER — NICOTINE 14 MG/24HR TD PT24: 1.0000 | MEDICATED_PATCH | TRANSDERMAL | Status: DC

## 2012-03-23 MED ORDER — OXYCODONE-ACETAMINOPHEN 5-325 MG PO TABS: 2.0000 | ORAL_TABLET | ORAL | Status: DC | PRN

## 2012-03-23 MED ORDER — ONDANSETRON 4 MG PO TBDP: 4.0000 mg | ORAL_TABLET | Freq: Three times a day (TID) | ORAL | Status: DC | PRN

## 2012-03-23 MED ORDER — POTASSIUM CHLORIDE CRYS ER 20 MEQ PO TBCR
40.0000 meq | EXTENDED_RELEASE_TABLET | Freq: Once | ORAL | Status: AC
  Administered 2012-03-23: 40 meq via ORAL
  Filled 2012-03-23: qty 2

## 2012-03-23 MED ORDER — MUPIROCIN 2 % EX OINT: 1.0000 "application " | TOPICAL_OINTMENT | Freq: Two times a day (BID) | CUTANEOUS | Status: DC

## 2012-03-23 NOTE — Discharge Summary (Signed)
Physician Discharge Summary  Patient ID: BALERIA WYMAN MRN: [id number] DOB/AGE: Sep 28, 1970 42 y.o.  Admit date: 03/20/2012 Discharge date: 03/23/2012  Primary Care Physician:  No primary provider on file.  Discharge Diagnoses:     . Facial cellulitis and abscess  Consults:  ENT, Dr Jenne Pane    Discharge Medications:   Medication List    STOP taking these medications       cephALEXin 500 MG capsule  Commonly known as:  KEFLEX     sulfamethoxazole-trimethoprim 800-160 MG per tablet  Commonly known as:  BACTRIM DS      TAKE these medications       ALPRAZolam 0.5 MG tablet  Commonly known as:  XANAX  Take 1 mg by mouth at bedtime as needed for sleep.     amphetamine-dextroamphetamine 30 MG tablet  Commonly known as:  ADDERALL  Take 30 mg by mouth 2 (two) times daily.     clindamycin 300 MG capsule  Commonly known as:  CLEOCIN  Take 2 capsules (600 mg total) by mouth 3 (three) times daily. X 10 days     mupirocin ointment 2 %  Commonly known as:  BACTROBAN  Apply 1 application topically 2 (two) times daily. Apply in each nare     nicotine 14 mg/24hr patch  Commonly known as:  NICODERM CQ - dosed in mg/24 hours  Place 1 patch onto the skin daily.     ondansetron 4 MG disintegrating tablet  Commonly known as:  ZOFRAN ODT  Take 1 tablet (4 mg total) by mouth every 8 (eight) hours as needed for nausea.     oxyCODONE-acetaminophen 5-325 MG per tablet  Commonly known as:  PERCOCET/ROXICET  Take 2 tablets by mouth every 4 (four) hours as needed for pain.     ranitidine 150 MG tablet  Commonly known as:  ZANTAC  Take 150 mg by mouth 2 (two) times daily as needed for heartburn.     sertraline 50 MG tablet  Commonly known as:  ZOLOFT  Take 50 mg by mouth daily.         Brief H and P: For complete details please refer to admission H and P, but in brief HPI: Kathline is a 42 yo WF with a PMHx of anxiety/panic disorder, ADD, and anemia with blood transfusions in the  past. She presented to the [facility] ED today with c/o right facial pain, swelling, heat, and redness with a "lesion" on her right cheek and in the right nares.  She began to feel "funny" in her face over the weekend (Sunday) with a "little soreness" with washing her face. Then, the next am, she awoke with swelling and pain in the right cheek area which later spread laterally and towards the jaw. She was seen at ED in [place] on Monday and was given Keflex and Bactrim for facial cellulitis which she has been compliant with. Also, Percocet for pain which is not helping much as of today. On the day of admission, her face began to hurt worse, get more red and swollen, so she returned to the ED.  She does have a hx of an sebaceous cyst excision from right axilla which was positive for MRSA.  The diagnosis of facial cellulitis/abscess was made. A CT was performed which showed soft tissue swelling consistent with cellulitis, clear sinuses, and anterior cervical lymphadenopathy.     Hospital Course:    Facial cellulitis with abscess: Failure to outpatient Bactrim and Keflex. Patient was admitted.  ENT was consulted, patient was seen by Dr. Jenne Pane, she underwent a bedside incision and drainage on 03/21/2012. Patient was started on vancomycin and  Zosyn. She had significant improvement after I&D was done. Per Dr. Jenne Pane instructions, patient was discharged on oral clindamycin for 10 days. She will followup with Dr Jenne Pane in office in one week. CT maxillofacial showed small fluid collection in anterior and right maxillary sinus,  small subcutaneous abscess. Patient was instructed to keep the area clean, return to ED or call Dr. Jenne Pane office if the facial erythema, tenderness, swelling or fevers return.   Anxiety: continue home Zoloft and xanax  Day of Discharge BP 96/51  Pulse 66  Temp(Src) 98 F (36.7 C) (Oral)  Resp 16  Ht 5\' 5"  (1.651 m)  Wt 50.803 kg (112 lb)  BMI 18.64 kg/m2  SpO2 98%  LMP  03/11/2012  Physical Exam:  General: Alert and awake, oriented x3  HEENT: erythema and swelling on the right cheek under the nose, improving after i&D  CVS: S1-S2 clear  Chest: CTAB  Abdomen: soft NT, ND  Extremities: no c/c/e      The results of significant diagnostics from this hospitalization (including imaging, microbiology, ancillary and laboratory) are listed below for reference.    LAB RESULTS: Basic Metabolic Panel:  Recent Labs Lab 2012/03/28 1800 03/21/12 0600  NA 136 135  K 3.3* 3.1*  CL 98 99  CO2 26 25  GLUCOSE 104* 93  BUN 9 8  CREATININE 0.60 0.45*  CALCIUM 9.2 8.5  MG  --  2.0  PHOS  --  3.7   Liver Function Tests:  Recent Labs Lab 03/21/12 0600  AST 14  ALT 8  ALKPHOS 106  BILITOT 0.3  PROT 6.7  ALBUMIN 3.0*   No results found for this basename: LIPASE, AMYLASE,  in the last 168 hours No results found for this basename: AMMONIA,  in the last 168 hours CBC:  Recent Labs Lab 28-Mar-2012 1800 03/21/12 0600  WBC 12.0* 9.7  NEUTROABS 9.4*  --   HGB 10.6* 9.7*  HCT 33.0* 29.5*  MCV 83.1 81.3  PLT 417* 347   Cardiac Enzymes: No results found for this basename: CKTOTAL, CKMB, CKMBINDEX, TROPONINI,  in the last 168 hours BNP: No components found with this basename: POCBNP,  CBG: No results found for this basename: GLUCAP,  in the last 168 hours  Significant Diagnostic Studies:  Ct Maxillofacial W/cm  03/28/12  *RADIOLOGY REPORT*  Clinical Data:  Right facial abscess below nose, facial redness and swelling for 3 days  CT MAXILLOFACIAL WITH CONTRAST  Technique:  Multidetector CT imaging of the maxillofacial structures was performed with intravenous contrast. Multiplanar CT image reconstructions were also generated.  Right side of face marked with a vitamin E capsule.  Contrast: OMNIPAQUE IOHEXOL 300 MG/ML  SOLN  Comparison: Head CT 07/26/2011  Findings: Visualized intracranial contents normal appearance. Symmetric normal-appearing  intraorbital soft tissue planes. Soft tissue swelling is identified anterior to the right maxillary sinus, beginning at the infraorbital region extending to the upper lip compatible with infection/cellulitis. A focal area of low attenuation is seen anteriorly which could represent a small subcutaneous fluid/abscess collection, 17 x 9 x 10 mm in size. No additional abnormal fluid collections identified. No soft tissue swelling extends to the lateral aspect of the right face. Nasal septal deviation to the right. Paranasal sinuses clear. Mildly enlarged lymph node medial to the angle of mandible. Additional normal to minimally prominent anterior cervical and  submandibular lymph nodes are seen bilaterally. No facial bony abnormalities identified.  IMPRESSION: Soft tissue swelling thickening right premaxillary extending to the right lateral face consistent with cellulitis. Small focal fluid collection 17 x 9 x 10 mm in size is identified anterior and right maxillary sinus lateral to the base of the nose question small subcutaneous abscess.   Original Report Authenticated By: Ulyses Southward, M.D.     2D ECHO:   Disposition and Follow-up:     Discharge Orders   Future Orders Complete By Expires     Diet - low sodium heart healthy  As directed     Discharge instructions  As directed     Comments:      Please keep area clean. Call Dr Jenne Pane office asap or come to ER if worsening facial  redness, swelling, pus or fever returns.    Increase activity slowly  As directed         DISPOSITION: Home DIET:Heart healthy diet ACTIVITY: As tolerated   DISCHARGE FOLLOW-UP Follow-up Information   Follow up with BATES, DWIGHT, MD. Schedule an appointment as soon as possible for a visit in 1 week. (please call asap if worsening redness, swelling, pain, fevers. Call on Monday for appointment.)    Contact information:   [address] [place] [address] [phone]       Time spent on  Discharge: 35 mins  Signed:   Selden Noteboom M.D. [facility] 03/23/2012, 12:06 PM Pager: [phone]

## 2012-03-23 NOTE — ED Provider Notes (Signed)
Medical screening examination/treatment/procedure(s) were conducted as a shared visit with non-physician practitioner(s) and myself.  I personally evaluated the patient during the encounter  Facial cellulitis, failing outpatient treatment. R/o abscess  Glynn Octave, MD 03/23/12 [phone]

## 2012-03-24 LAB — WOUND CULTURE

## 2012-08-07 ENCOUNTER — Emergency Department (HOSPITAL_BASED_OUTPATIENT_CLINIC_OR_DEPARTMENT_OTHER)
Admission: EM | Admit: 2012-08-07 | Discharge: 2012-08-07 | Disposition: A | Discharge status: Home / Self Care | Payer: Self-pay | Attending: Emergency Medicine | Admitting: Emergency Medicine

## 2012-08-07 ENCOUNTER — Encounter (HOSPITAL_BASED_OUTPATIENT_CLINIC_OR_DEPARTMENT_OTHER): Payer: Self-pay

## 2012-08-07 DIAGNOSIS — J340 Abscess, furuncle and carbuncle of nose: Secondary | ICD-10-CM

## 2012-08-07 DIAGNOSIS — J3489 Other specified disorders of nose and nasal sinuses: Secondary | ICD-10-CM | POA: Insufficient documentation

## 2012-08-07 DIAGNOSIS — Z79899 Other long term (current) drug therapy: Secondary | ICD-10-CM | POA: Insufficient documentation

## 2012-08-07 DIAGNOSIS — F17200 Nicotine dependence, unspecified, uncomplicated: Secondary | ICD-10-CM | POA: Insufficient documentation

## 2012-08-07 DIAGNOSIS — F988 Other specified behavioral and emotional disorders with onset usually occurring in childhood and adolescence: Secondary | ICD-10-CM | POA: Insufficient documentation

## 2012-08-07 DIAGNOSIS — Z862 Personal history of diseases of the blood and blood-forming organs and certain disorders involving the immune mechanism: Secondary | ICD-10-CM | POA: Insufficient documentation

## 2012-08-07 DIAGNOSIS — F411 Generalized anxiety disorder: Secondary | ICD-10-CM | POA: Insufficient documentation

## 2012-08-07 DIAGNOSIS — M7989 Other specified soft tissue disorders: Secondary | ICD-10-CM | POA: Insufficient documentation

## 2012-08-07 DIAGNOSIS — Z8614 Personal history of Methicillin resistant Staphylococcus aureus infection: Secondary | ICD-10-CM | POA: Insufficient documentation

## 2012-08-07 HISTORY — DX: Methicillin resistant Staphylococcus aureus infection, unspecified site: A49.02

## 2012-08-07 MED ORDER — SULFAMETHOXAZOLE-TRIMETHOPRIM 800-160 MG PO TABS: 1.0000 | ORAL_TABLET | Freq: Two times a day (BID) | ORAL | Status: DC

## 2012-08-07 MED ORDER — CEPHALEXIN 500 MG PO CAPS: 500.0000 mg | ORAL_CAPSULE | Freq: Four times a day (QID) | ORAL | Status: DC

## 2012-08-07 MED ORDER — HYDROCODONE-ACETAMINOPHEN 5-325 MG PO TABS: 2.0000 | ORAL_TABLET | ORAL | Status: DC | PRN

## 2012-08-07 MED ORDER — CEFTRIAXONE SODIUM 1 G IJ SOLR
1.0000 g | Freq: Once | INTRAMUSCULAR | Status: AC
  Administered 2012-08-07: 1 g via INTRAMUSCULAR
  Filled 2012-08-07: qty 10

## 2012-08-07 NOTE — ED Notes (Signed)
Bump to nose started yesterday-redness increased throughout the day-hx of MRSA with hosp in Feb 2014

## 2012-08-07 NOTE — ED Provider Notes (Signed)
CSN: [id number]     Arrival date & time 08/07/12  2059 History     First MD Initiated Contact with Patient 08/07/12 2251     Chief Complaint  Patient presents with  . Wound Infection   (Consider location/radiation/quality/duration/timing/severity/associated sxs/prior Treatment) Patient is a 42 y.o. female presenting with facial injury. The history is provided by the patient. No language interpreter was used.  Facial Injury Location:  Nose Pain details:    Quality:  Aching   Severity:  Moderate   Timing:  Constant Foreign body present:  No foreign bodies Pt had a bump on her nose, now red and swollen,   Pt was hospitalized with MRSA in february  Past Medical History  Diagnosis Date  . ADD (attention deficit disorder)   . Anemia   . Blood transfusion without reported diagnosis   . Anxiety disorder     panic  . Anxiety   . MRSA (methicillin resistant Staphylococcus aureus)    Past Surgical History  Procedure Laterality Date  . Gastric bypass    . Cesarean section    . Breast enhancement surgery     Family History  Problem Relation Age of Onset  . Other Other   . Other Other    History  Substance Use Topics  . Smoking status: Current Every Day Smoker -- 1.00 packs/day for 15 years    Types: Cigarettes  . Smokeless tobacco: Never Used  . Alcohol Use: No   OB History   Grav Para Term Preterm Abortions TAB SAB Ect Mult Living                 Review of Systems  HENT: Positive for facial swelling.   All other systems reviewed and are negative.    Allergies  Nsaids; Promethazine hcl; and Tramadol  Home Medications   Current Outpatient Rx  Name  Route  Sig  Dispense  Refill  . ALPRAZolam (XANAX) 0.5 MG tablet   Oral   Take 1 mg by mouth at bedtime as needed for sleep.         Marland Kitchen amphetamine-dextroamphetamine (ADDERALL) 30 MG tablet   Oral   Take 30 mg by mouth 2 (two) times daily.          . clindamycin (CLEOCIN) 300 MG capsule   Oral   Take 2  capsules (600 mg total) by mouth 3 (three) times daily. X 10 days   60 capsule   0   . mupirocin ointment (BACTROBAN) 2 %   Topical   Apply 1 application topically 2 (two) times daily. Apply in each nare   30 g   3   . nicotine (NICODERM CQ - DOSED IN MG/24 HOURS) 14 mg/24hr patch   Transdermal   Place 1 patch onto the skin daily.   28 patch   1   . ondansetron (ZOFRAN ODT) 4 MG disintegrating tablet   Oral   Take 1 tablet (4 mg total) by mouth every 8 (eight) hours as needed for nausea.   20 tablet   0   . oxyCODONE-acetaminophen (PERCOCET/ROXICET) 5-325 MG per tablet   Oral   Take 2 tablets by mouth every 4 (four) hours as needed for pain.   30 tablet   0   . ranitidine (ZANTAC) 150 MG tablet   Oral   Take 150 mg by mouth 2 (two) times daily as needed for heartburn.         . sertraline (ZOLOFT) 50 MG tablet  Oral   Take 50 mg by mouth daily.          BP 114/78  Pulse 80  Temp(Src) 98.5 F (36.9 C) (Oral)  Resp 20  Ht 5\' 5"  (1.651 m)  Wt 110 lb (49.896 kg)  BMI 18.31 kg/m2  SpO2 100%  LMP 07/15/2012 Physical Exam  Nursing note and vitals reviewed. Constitutional: She is oriented to person, place, and time. She appears well-developed and well-nourished.  HENT:  Head: Normocephalic.  Erythema  To nose,  Scabbed area,   Musculoskeletal: Normal range of motion.  Neurological: She is alert and oriented to person, place, and time.  Skin: Skin is warm.  Psychiatric: She has a normal mood and affect.    ED Course   Procedures (including critical care time)  Labs Reviewed - No data to display No results found. 1. Cellulitis of nose     MDM  Pt given Rocephin 1 gram.   RX for keflex, Bactrim and hydrocodone.   Pt advised recheck here in 12 hours  Lonia Skinner [place], [place] 08/07/12 2305

## 2012-08-08 NOTE — ED Provider Notes (Signed)
Medical screening examination/treatment/procedure(s) were performed by non-physician practitioner and as supervising physician I was immediately available for consultation/collaboration.   Lavance Beazer, MD 08/08/12 1548 

## 2012-08-09 ENCOUNTER — Emergency Department (HOSPITAL_BASED_OUTPATIENT_CLINIC_OR_DEPARTMENT_OTHER)
Admission: EM | Admit: 2012-08-09 | Discharge: 2012-08-09 | Disposition: A | Discharge status: Home / Self Care | Payer: Self-pay | Attending: Emergency Medicine | Admitting: Emergency Medicine

## 2012-08-09 ENCOUNTER — Encounter (HOSPITAL_BASED_OUTPATIENT_CLINIC_OR_DEPARTMENT_OTHER): Payer: Self-pay | Admitting: *Deleted

## 2012-08-09 DIAGNOSIS — J340 Abscess, furuncle and carbuncle of nose: Secondary | ICD-10-CM

## 2012-08-09 DIAGNOSIS — Z5189 Encounter for other specified aftercare: Secondary | ICD-10-CM | POA: Insufficient documentation

## 2012-08-09 DIAGNOSIS — F988 Other specified behavioral and emotional disorders with onset usually occurring in childhood and adolescence: Secondary | ICD-10-CM | POA: Insufficient documentation

## 2012-08-09 DIAGNOSIS — Z8614 Personal history of Methicillin resistant Staphylococcus aureus infection: Secondary | ICD-10-CM | POA: Insufficient documentation

## 2012-08-09 DIAGNOSIS — Z79899 Other long term (current) drug therapy: Secondary | ICD-10-CM | POA: Insufficient documentation

## 2012-08-09 DIAGNOSIS — J3489 Other specified disorders of nose and nasal sinuses: Secondary | ICD-10-CM | POA: Insufficient documentation

## 2012-08-09 DIAGNOSIS — F17200 Nicotine dependence, unspecified, uncomplicated: Secondary | ICD-10-CM | POA: Insufficient documentation

## 2012-08-09 DIAGNOSIS — F411 Generalized anxiety disorder: Secondary | ICD-10-CM | POA: Insufficient documentation

## 2012-08-09 DIAGNOSIS — Z862 Personal history of diseases of the blood and blood-forming organs and certain disorders involving the immune mechanism: Secondary | ICD-10-CM | POA: Insufficient documentation

## 2012-08-09 LAB — CBC WITH DIFFERENTIAL/PLATELET
Basophils Relative: 1 % (ref 0–1)
Eosinophils Absolute: 0.3 10*3/uL (ref 0.0–0.7)
MCH: 26.2 pg (ref 26.0–34.0)
MCHC: 32.3 g/dL (ref 30.0–36.0)
Monocytes Relative: 6 % (ref 3–12)
Neutrophils Relative %: 75 % (ref 43–77)
Platelets: 372 10*3/uL (ref 150–400)

## 2012-08-09 LAB — BASIC METABOLIC PANEL
BUN: 11 mg/dL (ref 6–23)
GFR calc Af Amer: >90 mL/min (ref >90)
GFR calc non Af Amer: >90 mL/min (ref >90)
Potassium: 3.6 mEq/L (ref 3.5–5.1)

## 2012-08-09 MED ORDER — HYDROMORPHONE HCL PF 1 MG/ML IJ SOLN
1.0000 mg | Freq: Once | INTRAMUSCULAR | Status: AC
  Administered 2012-08-09: 1 mg via INTRAVENOUS
  Filled 2012-08-09: qty 1

## 2012-08-09 MED ORDER — VANCOMYCIN HCL IN DEXTROSE 1-5 GM/200ML-% IV SOLN
1000.0000 mg | Freq: Once | INTRAVENOUS | Status: AC
  Administered 2012-08-09: 1000 mg via INTRAVENOUS
  Filled 2012-08-09: qty 200

## 2012-08-09 MED ORDER — OXYCODONE-ACETAMINOPHEN 5-325 MG PO TABS
2.0000 | ORAL_TABLET | Freq: Once | ORAL | Status: AC
  Administered 2012-08-09: 2 via ORAL
  Filled 2012-08-09 (×2): qty 2

## 2012-08-09 MED ORDER — SODIUM CHLORIDE 0.9 % IV SOLN
Freq: Once | INTRAVENOUS | Status: AC
  Administered 2012-08-09: 21:00:00 via INTRAVENOUS

## 2012-08-09 MED ORDER — DEXTROSE 5 % IV SOLN
1.0000 g | INTRAVENOUS | Status: DC
  Administered 2012-08-09: 1 g via INTRAVENOUS
  Filled 2012-08-09: qty 10

## 2012-08-09 MED ORDER — OXYCODONE-ACETAMINOPHEN 5-325 MG PO TABS: 2.0000 | ORAL_TABLET | ORAL | Status: DC | PRN

## 2012-08-09 MED ORDER — ONDANSETRON HCL 4 MG/2ML IJ SOLN
4.0000 mg | Freq: Once | INTRAMUSCULAR | Status: AC
  Administered 2012-08-09: 4 mg via INTRAVENOUS
  Filled 2012-08-09: qty 2

## 2012-08-09 NOTE — ED Provider Notes (Signed)
CSN: [id number]     Arrival date & time 08/09/12  2004 History     First MD Initiated Contact with Patient 08/09/12 2027     Chief Complaint  Patient presents with  . Wound Check   (Consider location/radiation/quality/duration/timing/severity/associated sxs/prior Treatment) Patient is a 42 y.o. female presenting with wound check. The history is provided by the patient. No language interpreter was used.  Wound Check This is a new problem. The current episode started today. The problem occurs constantly. The problem has been gradually worsening. Nothing aggravates the symptoms. She has tried nothing for the symptoms. The treatment provided mild relief.   Pt seen here by me yesterday.   Pt has redness and swelling to her nose.  Pt reports she vomited before she came in.   Pt reports nose is painful and swollen Past Medical History  Diagnosis Date  . ADD (attention deficit disorder)   . Anemia   . Blood transfusion without reported diagnosis   . Anxiety disorder     panic  . Anxiety   . MRSA (methicillin resistant Staphylococcus aureus)    Past Surgical History  Procedure Laterality Date  . Gastric bypass    . Cesarean section    . Breast enhancement surgery     Family History  Problem Relation Age of Onset  . Other Other   . Other Other    History  Substance Use Topics  . Smoking status: Current Every Day Smoker -- 1.00 packs/day for 15 years    Types: Cigarettes  . Smokeless tobacco: Never Used  . Alcohol Use: No   OB History   Grav Para Term Preterm Abortions TAB SAB Ect Mult Living                 Review of Systems  All other systems reviewed and are negative.    Allergies  Nsaids; Promethazine hcl; and Tramadol  Home Medications   Current Outpatient Rx  Name  Route  Sig  Dispense  Refill  . ALPRAZolam (XANAX) 0.5 MG tablet   Oral   Take 1 mg by mouth at bedtime as needed for sleep.         Marland Kitchen amphetamine-dextroamphetamine (ADDERALL) 30 MG tablet  Oral   Take 30 mg by mouth 2 (two) times daily.          . cephALEXin (KEFLEX) 500 MG capsule   Oral   Take 1 capsule (500 mg total) by mouth 4 (four) times daily.   40 capsule   0   . clindamycin (CLEOCIN) 300 MG capsule   Oral   Take 2 capsules (600 mg total) by mouth 3 (three) times daily. X 10 days   60 capsule   0   . HYDROcodone-acetaminophen (NORCO/VICODIN) 5-325 MG per tablet   Oral   Take 2 tablets by mouth every 4 (four) hours as needed.   16 tablet   0   . mupirocin ointment (BACTROBAN) 2 %   Topical   Apply 1 application topically 2 (two) times daily. Apply in each nare   30 g   3   . nicotine (NICODERM CQ - DOSED IN MG/24 HOURS) 14 mg/24hr patch   Transdermal   Place 1 patch onto the skin daily.   28 patch   1   . ondansetron (ZOFRAN ODT) 4 MG disintegrating tablet   Oral   Take 1 tablet (4 mg total) by mouth every 8 (eight) hours as needed for nausea.   20  tablet   0   . oxyCODONE-acetaminophen (PERCOCET/ROXICET) 5-325 MG per tablet   Oral   Take 2 tablets by mouth every 4 (four) hours as needed for pain.   30 tablet   0   . ranitidine (ZANTAC) 150 MG tablet   Oral   Take 150 mg by mouth 2 (two) times daily as needed for heartburn.         . sertraline (ZOLOFT) 50 MG tablet   Oral   Take 50 mg by mouth daily.         Marland Kitchen sulfamethoxazole-trimethoprim (SEPTRA DS) 800-160 MG per tablet   Oral   Take 1 tablet by mouth every 12 (twelve) hours.   10 tablet   0    BP 132/84  Pulse 90  Temp(Src) 97.9 F (36.6 C) (Oral)  Resp 20  Wt 110 lb (49.896 kg)  BMI 18.31 kg/m2  SpO2 100%  LMP 07/15/2012 Physical Exam  Nursing note and vitals reviewed. Constitutional: She appears well-developed and well-nourished.  HENT:  Head: Normocephalic and atraumatic.  Swollen distal nose,   Decreased area of redness,   Redness more localized to end of nose  Eyes: Conjunctivae and EOM are normal. Pupils are equal, round, and reactive to light.  Neck:  Normal range of motion.  Cardiovascular: Normal rate.   Pulmonary/Chest: Effort normal.  Neurological: She is alert.  Skin: Skin is warm.    ED Course   Procedures (including critical care time)  Labs Reviewed  CBC WITH DIFFERENTIAL - Abnormal; Notable for the following:    WBC 11.0 (*)    RBC 3.82 (*)    Hemoglobin 10.0 (*)    HCT 31.0 (*)    RDW 16.1 (*)    Neutro Abs 8.2 (*)    All other components within normal limits  BASIC METABOLIC PANEL - Abnormal; Notable for the following:    Glucose, Bld 58 (*)    All other components within normal limits   No results found. No diagnosis found.  MDM  Percocet [address]   Lonia Skinner [place], [place] 08/09/12 2208

## 2012-08-09 NOTE — ED Notes (Signed)
Here to have an abscess to her nose checked.

## 2012-08-09 NOTE — ED Provider Notes (Signed)
Medical screening examination/treatment/procedure(s) were performed by non-physician practitioner and as supervising physician I was immediately available for consultation/collaboration.   Kiandre Spagnolo Y. Eleanor Gatliff, MD 08/09/12 2258 

## 2012-09-17 ENCOUNTER — Emergency Department (HOSPITAL_BASED_OUTPATIENT_CLINIC_OR_DEPARTMENT_OTHER): Payer: PRIVATE HEALTH INSURANCE

## 2012-09-17 ENCOUNTER — Emergency Department (HOSPITAL_BASED_OUTPATIENT_CLINIC_OR_DEPARTMENT_OTHER)
Admission: EM | Admit: 2012-09-17 | Discharge: 2012-09-17 | Disposition: A | Discharge status: Home / Self Care | Payer: PRIVATE HEALTH INSURANCE | Attending: Emergency Medicine | Admitting: Emergency Medicine

## 2012-09-17 ENCOUNTER — Encounter (HOSPITAL_BASED_OUTPATIENT_CLINIC_OR_DEPARTMENT_OTHER): Payer: Self-pay | Admitting: *Deleted

## 2012-09-17 DIAGNOSIS — F411 Generalized anxiety disorder: Secondary | ICD-10-CM | POA: Insufficient documentation

## 2012-09-17 DIAGNOSIS — R109 Unspecified abdominal pain: Secondary | ICD-10-CM

## 2012-09-17 DIAGNOSIS — F17200 Nicotine dependence, unspecified, uncomplicated: Secondary | ICD-10-CM | POA: Insufficient documentation

## 2012-09-17 DIAGNOSIS — R509 Fever, unspecified: Secondary | ICD-10-CM | POA: Insufficient documentation

## 2012-09-17 DIAGNOSIS — R350 Frequency of micturition: Secondary | ICD-10-CM | POA: Insufficient documentation

## 2012-09-17 DIAGNOSIS — Z79899 Other long term (current) drug therapy: Secondary | ICD-10-CM | POA: Insufficient documentation

## 2012-09-17 DIAGNOSIS — Z9884 Bariatric surgery status: Secondary | ICD-10-CM | POA: Insufficient documentation

## 2012-09-17 DIAGNOSIS — Z8614 Personal history of Methicillin resistant Staphylococcus aureus infection: Secondary | ICD-10-CM | POA: Insufficient documentation

## 2012-09-17 DIAGNOSIS — Z792 Long term (current) use of antibiotics: Secondary | ICD-10-CM | POA: Insufficient documentation

## 2012-09-17 DIAGNOSIS — Z862 Personal history of diseases of the blood and blood-forming organs and certain disorders involving the immune mechanism: Secondary | ICD-10-CM | POA: Insufficient documentation

## 2012-09-17 DIAGNOSIS — Z3202 Encounter for pregnancy test, result negative: Secondary | ICD-10-CM | POA: Insufficient documentation

## 2012-09-17 DIAGNOSIS — F988 Other specified behavioral and emotional disorders with onset usually occurring in childhood and adolescence: Secondary | ICD-10-CM | POA: Insufficient documentation

## 2012-09-17 LAB — URINALYSIS, ROUTINE W REFLEX MICROSCOPIC
Bilirubin Urine: NEGATIVE
Ketones, ur: NEGATIVE mg/dL
Nitrite: NEGATIVE
Protein, ur: NEGATIVE mg/dL
Specific Gravity, Urine: 1.006 (ref 1.005–1.030)
Urobilinogen, UA: 0.2 mg/dL (ref 0.0–1.0)

## 2012-09-17 MED ORDER — HYDROCODONE-ACETAMINOPHEN 5-325 MG PO TABS: 1.0000 | ORAL_TABLET | Freq: Four times a day (QID) | ORAL | Status: DC | PRN

## 2012-09-17 NOTE — ED Notes (Signed)
Pt updated that ct renal has been ordered.

## 2012-09-17 NOTE — ED Notes (Signed)
Patient transported to CT via stretcher per tech. 

## 2012-09-17 NOTE — ED Notes (Signed)
Pt ambulatory to restroom no difficulty.  

## 2012-09-17 NOTE — ED Notes (Signed)
MD at bedside to discuss results of testing. 

## 2012-09-17 NOTE — ED Notes (Signed)
C/o lower back pain and urinary pressure. Denies fever, vomiting or chills.

## 2012-09-17 NOTE — ED Notes (Signed)
MD at bedside. 

## 2012-09-17 NOTE — ED Provider Notes (Signed)
CSN: [id number]     Arrival date & time 09/17/12  0035 History   First MD Initiated Contact with Patient 09/17/12 0049     Chief Complaint  Patient presents with  . Flank Pain   (Consider location/radiation/quality/duration/timing/severity/associated sxs/prior Treatment) HPI This is a 42 year old female with a two-day history of right flank pain. The pain is moderate and somewhat worse with movement. It has been associated with urinary urgency and urination of small amounts. She's had low-grade fever to 100.4. She denies chills. She denies vomiting. She denies burning with urination. She has taken Tylenol with some relief but states the Tylenol has exacerbated her chronic ulcer symptoms.  Past Medical History  Diagnosis Date  . ADD (attention deficit disorder)   . Anemia   . Blood transfusion without reported diagnosis   . Anxiety disorder     panic  . Anxiety   . MRSA (methicillin resistant Staphylococcus aureus)    Past Surgical History  Procedure Laterality Date  . Gastric bypass    . Cesarean section    . Breast enhancement surgery     Family History  Problem Relation Age of Onset  . Other Other   . Other Other    History  Substance Use Topics  . Smoking status: Current Every Day Smoker -- 1.00 packs/day for 15 years    Types: Cigarettes  . Smokeless tobacco: Never Used  . Alcohol Use: No   OB History   Grav Para Term Preterm Abortions TAB SAB Ect Mult Living                 Review of Systems  All other systems reviewed and are negative.    Allergies  Nsaids; Promethazine hcl; and Tramadol  Home Medications   Current Outpatient Rx  Name  Route  Sig  Dispense  Refill  . ALPRAZolam (XANAX) 0.5 MG tablet   Oral   Take 1 mg by mouth at bedtime as needed for sleep.         Marland Kitchen amphetamine-dextroamphetamine (ADDERALL) 30 MG tablet   Oral   Take 30 mg by mouth 2 (two) times daily.          . ranitidine (ZANTAC) 150 MG tablet   Oral   Take 150 mg by  mouth 2 (two) times daily as needed for heartburn.         . sertraline (ZOLOFT) 50 MG tablet   Oral   Take 50 mg by mouth daily.         . cephALEXin (KEFLEX) 500 MG capsule   Oral   Take 1 capsule (500 mg total) by mouth 4 (four) times daily.   40 capsule   0   . clindamycin (CLEOCIN) 300 MG capsule   Oral   Take 2 capsules (600 mg total) by mouth 3 (three) times daily. X 10 days   60 capsule   0   . HYDROcodone-acetaminophen (NORCO/VICODIN) 5-325 MG per tablet   Oral   Take 2 tablets by mouth every 4 (four) hours as needed.   16 tablet   0   . mupirocin ointment (BACTROBAN) 2 %   Topical   Apply 1 application topically 2 (two) times daily. Apply in each nare   30 g   3   . nicotine (NICODERM CQ - DOSED IN MG/24 HOURS) 14 mg/24hr patch   Transdermal   Place 1 patch onto the skin daily.   28 patch   1   . ondansetron (  ZOFRAN ODT) 4 MG disintegrating tablet   Oral   Take 1 tablet (4 mg total) by mouth every 8 (eight) hours as needed for nausea.   20 tablet   0   . oxyCODONE-acetaminophen (PERCOCET/ROXICET) 5-325 MG per tablet   Oral   Take 2 tablets by mouth every 4 (four) hours as needed for pain.   30 tablet   0   . oxyCODONE-acetaminophen (PERCOCET/ROXICET) 5-325 MG per tablet   Oral   Take 2 tablets by mouth every 4 (four) hours as needed for pain.   20 tablet   0   . sulfamethoxazole-trimethoprim (SEPTRA DS) 800-160 MG per tablet   Oral   Take 1 tablet by mouth every 12 (twelve) hours.   10 tablet   0    BP 123/82  Pulse 98  Temp(Src) 98.2 F (36.8 C) (Oral)  Resp 20  Ht 5' 4.5" (1.638 m)  Wt 110 lb (49.896 kg)  BMI 18.6 kg/m2  SpO2 100%  Physical Exam General: Well-developed, cachectic female in no acute distress; appearance consistent with age of record HENT: normocephalic; atraumatic Eyes: pupils equal, round and reactive to light; extraocular muscles intact Neck: supple Heart: regular rate and rhythm; no murmurs, rubs or  gallops Lungs: clear to auscultation bilaterally Abdomen: soft; nondistended; suprapubic tenderness; no masses or hepatosplenomegaly; bowel sounds present GU: Mild right CVA tenderness Extremities: No deformity; full range of motion; pulses normal; no edema Neurologic: Awake, alert and oriented; motor function intact in all extremities and symmetric; no facial droop Skin: Warm and dry Psychiatric: Normal mood and affect    ED Course  Procedures (including critical care time)   MDM   Nursing notes and vitals signs, including pulse oximetry, reviewed.  Summary of this visit's results, reviewed by myself:  Labs:  Results for orders placed during the hospital encounter of 09/17/12 (from the past 24 hour(s))  URINALYSIS, ROUTINE W REFLEX MICROSCOPIC     Status: None   Collection Time    09/17/12 12:40 AM      Result Value Range   Color, Urine YELLOW  YELLOW   APPearance CLEAR  CLEAR   Specific Gravity, Urine 1.006  1.005 - 1.030   pH 6.0  5.0 - 8.0   Glucose, UA NEGATIVE  NEGATIVE mg/dL   Hgb urine dipstick NEGATIVE  NEGATIVE   Bilirubin Urine NEGATIVE  NEGATIVE   Ketones, ur NEGATIVE  NEGATIVE mg/dL   Protein, ur NEGATIVE  NEGATIVE mg/dL   Urobilinogen, UA 0.2  0.0 - 1.0 mg/dL   Nitrite NEGATIVE  NEGATIVE   Leukocytes, UA NEGATIVE  NEGATIVE  PREGNANCY, URINE     Status: None   Collection Time    09/17/12 12:40 AM      Result Value Range   Preg Test, Ur NEGATIVE  NEGATIVE    Imaging Studies: Ct Abdomen Pelvis Wo Contrast  09/17/2012   *RADIOLOGY REPORT*  Clinical Data: The right flank pain.  Nausea and vomiting.  CT ABDOMEN AND PELVIS WITHOUT CONTRAST  Technique:  Multidetector CT imaging of the abdomen and pelvis was performed following the standard protocol without intravenous contrast.  Comparison: 02/19/2011  Findings: Lung bases are clear.  Bilateral breast implants.  The kidneys appear symmetrical in size and shape.  No pyelocaliectasis or ureterectasis.  No renal,  ureteral, or bladder stones.  Surgical changes in the EG junction and stomach consistent with gastric bypass.  The unenhanced appearance of the liver, spleen, gallbladder, pancreas, adrenal glands, inferior vena cava, abdominal  aorta, and retroperitoneal lymph nodes is unremarkable. The stomach and small bowel are decompressed.  Gas and stool in the colon without distension.  No free air or free fluid in the abdomen.  Pelvis:  Uterus and ovaries are not enlarged.  No evidence of diverticulitis.  The appendix is not identified.  No free or loculated pelvic fluid collections.  No significant pelvic lymphadenopathy.  Mild degenerative changes of the lumbar spine.  IMPRESSION: No renal or ureteral stone or obstruction demonstrated.   Original Report Authenticated By: Burman Nieves, M.D.   2:16 AM Patient advised of lab and CT findings. Patient states that she has had similar episodes in the past without explanation. She will follow up with her primary care physician.      Hanley Seamen, MD 09/17/12 [phone]

## 2012-12-01 ENCOUNTER — Encounter (HOSPITAL_BASED_OUTPATIENT_CLINIC_OR_DEPARTMENT_OTHER): Payer: Self-pay | Admitting: Emergency Medicine

## 2012-12-01 ENCOUNTER — Emergency Department (HOSPITAL_BASED_OUTPATIENT_CLINIC_OR_DEPARTMENT_OTHER)
Admission: EM | Admit: 2012-12-01 | Discharge: 2012-12-01 | Disposition: A | Discharge status: Home / Self Care | Payer: PRIVATE HEALTH INSURANCE | Attending: Emergency Medicine | Admitting: Emergency Medicine

## 2012-12-01 DIAGNOSIS — R52 Pain, unspecified: Secondary | ICD-10-CM | POA: Insufficient documentation

## 2012-12-01 DIAGNOSIS — Z8614 Personal history of Methicillin resistant Staphylococcus aureus infection: Secondary | ICD-10-CM | POA: Insufficient documentation

## 2012-12-01 DIAGNOSIS — R350 Frequency of micturition: Secondary | ICD-10-CM | POA: Insufficient documentation

## 2012-12-01 DIAGNOSIS — Z862 Personal history of diseases of the blood and blood-forming organs and certain disorders involving the immune mechanism: Secondary | ICD-10-CM | POA: Insufficient documentation

## 2012-12-01 DIAGNOSIS — R109 Unspecified abdominal pain: Secondary | ICD-10-CM

## 2012-12-01 DIAGNOSIS — Z79899 Other long term (current) drug therapy: Secondary | ICD-10-CM | POA: Insufficient documentation

## 2012-12-01 DIAGNOSIS — F988 Other specified behavioral and emotional disorders with onset usually occurring in childhood and adolescence: Secondary | ICD-10-CM | POA: Insufficient documentation

## 2012-12-01 DIAGNOSIS — F410 Panic disorder [episodic paroxysmal anxiety] without agoraphobia: Secondary | ICD-10-CM | POA: Insufficient documentation

## 2012-12-01 DIAGNOSIS — R112 Nausea with vomiting, unspecified: Secondary | ICD-10-CM | POA: Insufficient documentation

## 2012-12-01 DIAGNOSIS — M549 Dorsalgia, unspecified: Secondary | ICD-10-CM | POA: Insufficient documentation

## 2012-12-01 DIAGNOSIS — Z87442 Personal history of urinary calculi: Secondary | ICD-10-CM | POA: Insufficient documentation

## 2012-12-01 DIAGNOSIS — R3915 Urgency of urination: Secondary | ICD-10-CM | POA: Insufficient documentation

## 2012-12-01 DIAGNOSIS — F17200 Nicotine dependence, unspecified, uncomplicated: Secondary | ICD-10-CM | POA: Insufficient documentation

## 2012-12-01 DIAGNOSIS — Z3202 Encounter for pregnancy test, result negative: Secondary | ICD-10-CM | POA: Insufficient documentation

## 2012-12-01 LAB — URINALYSIS, ROUTINE W REFLEX MICROSCOPIC
Ketones, ur: NEGATIVE mg/dL
Leukocytes, UA: NEGATIVE
Nitrite: NEGATIVE
Protein, ur: NEGATIVE mg/dL
Urobilinogen, UA: 1 mg/dL (ref 0.0–1.0)

## 2012-12-01 MED ORDER — HYDROMORPHONE HCL PF 1 MG/ML IJ SOLN
1.0000 mg | Freq: Once | INTRAMUSCULAR | Status: AC
  Administered 2012-12-01: 1 mg via INTRAMUSCULAR
  Filled 2012-12-01: qty 1

## 2012-12-01 MED ORDER — HYDROCODONE-ACETAMINOPHEN 5-325 MG PO TABS: 1.0000 | ORAL_TABLET | ORAL | Status: DC | PRN

## 2012-12-01 MED ORDER — ONDANSETRON 4 MG PO TBDP
4.0000 mg | ORAL_TABLET | Freq: Once | ORAL | Status: AC
  Administered 2012-12-01: 4 mg via ORAL
  Filled 2012-12-01: qty 1

## 2012-12-01 NOTE — ED Notes (Signed)
Right flank pain since last night, some nausea, no fever.

## 2012-12-01 NOTE — ED Provider Notes (Signed)
Medical screening examination/treatment/procedure(s) were performed by non-physician practitioner and as supervising physician I was immediately available for consultation/collaboration.  EKG Interpretation   None         Rolan Bucco, MD 12/01/12 1439

## 2012-12-01 NOTE — ED Provider Notes (Signed)
CSN: [id number]     Arrival date & time 12/01/12  1240 History   First MD Initiated Contact with Patient 12/01/12 1311     Chief Complaint  Patient presents with  . Flank Pain  . Nausea   (Consider location/radiation/quality/duration/timing/severity/associated sxs/prior Treatment) HPI 42 yo female presents with RIGHT flank pain and back pain starting at 3am today. Patient reports hx of Kidney Stones in the past. Pain described as sharp and progressive rated at 10/10. Admits to 3 episodes of vomiting since 9 am. Denies hematemesis. Denies Fever, chills, Chest pain, and Dyspnea. Denies hematuria and dysuria. Denies hx of STI, not currently sexually active, no vaginal discharge.  Past Medical History  Diagnosis Date  . ADD (attention deficit disorder)   . Anemia   . Blood transfusion without reported diagnosis   . Anxiety disorder     panic  . Anxiety   . MRSA (methicillin resistant Staphylococcus aureus)    Past Surgical History  Procedure Laterality Date  . Gastric bypass    . Cesarean section    . Breast enhancement surgery     Family History  Problem Relation Age of Onset  . Other Other   . Other Other    History  Substance Use Topics  . Smoking status: Current Every Day Smoker -- 1.00 packs/day for 15 years    Types: Cigarettes  . Smokeless tobacco: Never Used  . Alcohol Use: No   OB History   Grav Para Term Preterm Abortions TAB SAB Ect Mult Living                 Review of Systems  Constitutional: Negative for fever and chills.  Respiratory: Negative for chest tightness and shortness of breath.   Cardiovascular: Negative for chest pain.  Gastrointestinal: Positive for nausea, vomiting and abdominal pain. Negative for diarrhea, constipation and abdominal distention.  Genitourinary: Positive for urgency, frequency and flank pain. Negative for dysuria, hematuria, vaginal bleeding, vaginal discharge and menstrual problem.  Musculoskeletal: Positive for back pain.   All other systems reviewed and are negative.    Allergies  Nsaids; Promethazine hcl; and Tramadol  Home Medications   Current Outpatient Rx  Name  Route  Sig  Dispense  Refill  . ALPRAZolam (XANAX) 0.5 MG tablet   Oral   Take 1 mg by mouth at bedtime as needed for sleep.         Marland Kitchen amphetamine-dextroamphetamine (ADDERALL) 30 MG tablet   Oral   Take 30 mg by mouth 2 (two) times daily.          . ranitidine (ZANTAC) 150 MG tablet   Oral   Take 150 mg by mouth 2 (two) times daily as needed for heartburn.         . sertraline (ZOLOFT) 50 MG tablet   Oral   Take 50 mg by mouth daily.          BP 107/56  Pulse 94  Temp(Src) 98.3 F (36.8 C)  Resp 16  Ht 5\' 5"  (1.651 m)  Wt 116 lb (52.617 kg)  BMI 19.30 kg/m2  LMP 11/17/2012 Physical Exam  Constitutional: She is oriented to person, place, and time. She appears cachectic. She is cooperative.  Non-toxic appearance. She does not have a sickly appearance.  HENT:  Head: Normocephalic and atraumatic.  Cardiovascular: Normal rate and regular rhythm.  Exam reveals no gallop and no friction rub.   No murmur heard. Pulmonary/Chest: Effort normal and breath sounds normal.  Abdominal:  There is tenderness. There is CVA tenderness (RIGHT). There is no rigidity, no guarding, no tenderness at McBurney's point and negative Murphy's sign.  Right side flank tenderness   Musculoskeletal: Normal range of motion.  Neurological: She is alert and oriented to person, place, and time.  Skin: Skin is warm and dry.  Psychiatric: She has a normal mood and affect. Her behavior is normal.    ED Course  Procedures (including critical care time) Labs Review Labs Reviewed  URINALYSIS, ROUTINE W REFLEX MICROSCOPIC  PREGNANCY, URINE   Imaging Review No results found.  EKG Interpretation   None       MDM   1. Flank pain, acute    42 yo female presents with flank pain and vomiting x 1 day. PMH significant for kidney stones.  Patient seen in September for similar complaint, with Negative CT for renal stones. Patient afebrile with normal vitals. Urine Negative for blood and leukocytes. Urine preg negative. Patient pain and nausea treated in ED. Pt discharged with rx for Norco x 10 tablets to cover her until she can see PCP. Discussed follow up with PCP and given resource guide.  Return to ED if patient should develop fever or symptoms worsen.     Rudene Anda, PA-C 12/01/12 1407

## 2012-12-06 ENCOUNTER — Ambulatory Visit (INDEPENDENT_AMBULATORY_CARE_PROVIDER_SITE_OTHER): Payer: PRIVATE HEALTH INSURANCE | Admitting: Emergency Medicine

## 2012-12-06 VITALS — BP 138/82 | HR 103 | Temp 98.0°F | Resp 17 | Ht 65.5 in | Wt 106.0 lb

## 2012-12-06 DIAGNOSIS — G894 Chronic pain syndrome: Secondary | ICD-10-CM

## 2012-12-06 DIAGNOSIS — F988 Other specified behavioral and emotional disorders with onset usually occurring in childhood and adolescence: Secondary | ICD-10-CM

## 2012-12-06 DIAGNOSIS — F411 Generalized anxiety disorder: Secondary | ICD-10-CM

## 2012-12-06 MED ORDER — ALPRAZOLAM 0.5 MG PO TABS: 1.0000 mg | ORAL_TABLET | Freq: Every evening | ORAL | Status: DC | PRN

## 2012-12-06 MED ORDER — AMPHETAMINE-DEXTROAMPHETAMINE 30 MG PO TABS: 30.0000 mg | ORAL_TABLET | Freq: Two times a day (BID) | ORAL | Status: DC

## 2012-12-06 MED ORDER — OMEPRAZOLE 40 MG PO CPDR: 40.0000 mg | DELAYED_RELEASE_CAPSULE | Freq: Every day | ORAL | Status: DC

## 2012-12-06 MED ORDER — HYDROCODONE-ACETAMINOPHEN 5-325 MG PO TABS: 1.0000 | ORAL_TABLET | ORAL | Status: DC | PRN

## 2012-12-06 MED ORDER — SERTRALINE HCL 50 MG PO TABS: 50.0000 mg | ORAL_TABLET | Freq: Every day | ORAL | Status: DC

## 2012-12-06 NOTE — Patient Instructions (Signed)
Attention Deficit Hyperactivity Disorder Attention deficit hyperactivity disorder (ADHD) is a problem with behavior issues based on the way the brain functions (neurobehavioral disorder). It is a common reason for behavior and academic problems in school. CAUSES  The cause of ADHD is unknown in most cases. It may run in families. It sometimes can be associated with learning disabilities and other behavioral problems. SYMPTOMS  There are 3 types of ADHD. The 3 types and some of the symptoms include:  Inattentive  Gets bored or distracted easily.  Loses or forgets things. Forgets to hand in homework.  Has trouble organizing or completing tasks.  Difficulty staying on task.  An inability to organize daily tasks and school work.  Leaving projects, chores, or homework unfinished.  Trouble paying attention or responding to details. Careless mistakes.  Difficulty following directions. Often seems like is not listening.  Dislikes activities that require sustained attention (like chores or homework).  Hyperactive-impulsive  Feels like it is impossible to sit still or stay in a seat. Fidgeting with hands and feet.  Trouble waiting turn.  Talking too much or out of turn. Interruptive.  Speaks or acts impulsively.  Aggressive, disruptive behavior.  Constantly busy or on the go, noisy.  Combined  Has symptoms of both of the above. Often children with ADHD feel discouraged about themselves and with school. They often perform well below their abilities in school. These symptoms can cause problems in home, school, and in relationships with peers. As children get older, the excess motor activities can calm down, but the problems with paying attention and staying organized persist. Most children do not outgrow ADHD but with good treatment can learn to cope with the symptoms. DIAGNOSIS  When ADHD is suspected, the diagnosis should be made by professionals trained in ADHD.  Diagnosis will  include:  Ruling out other reasons for the child's behavior.  The caregivers will check with the child's school and check their medical records.  They will talk to teachers and parents.  Behavior rating scales for the child will be filled out by those dealing with the child on a daily basis. A diagnosis is made only after all information has been considered. TREATMENT  Treatment usually includes behavioral treatment often along with medicines. It may include stimulant medicines. The stimulant medicines decrease impulsivity and hyperactivity and increase attention. Other medicines used include antidepressants and certain blood pressure medicines. Most experts agree that treatment for ADHD should address all aspects of the child's functioning. Treatment should not be limited to the use of medicines alone. Treatment should include structured classroom management. The parents must receive education to address rewarding good behavior, discipline, and limit-setting. Tutoring or behavioral therapy or both should be available for the child. If untreated, the disorder can have long-term serious effects into adolescence and adulthood. HOME CARE INSTRUCTIONS   Often with ADHD there is a lot of frustration among the family in dealing with the illness. There is often blame and anger that is not warranted. This is a life long illness. There is no way to prevent ADHD. In many cases, because the problem affects the family as a whole, the entire family may need help. A therapist can help the family find better ways to handle the disruptive behaviors and promote change. If the child is young, most of the therapist's work is with the parents. Parents will learn techniques for coping with and improving their child's behavior. Sometimes only the child with the ADHD needs counseling. Your caregivers can help   you make these decisions.  Children with ADHD may need help in organizing. Some helpful tips include:  Keep  routines the same every day from wake-up time to bedtime. Schedule everything. This includes homework and playtime. This should include outdoor and indoor recreation. Keep the schedule on the refrigerator or a bulletin board where it is frequently seen. Mark schedule changes as far in advance as possible.  Have a place for everything and keep everything in its place. This includes clothing, backpacks, and school supplies.  Encourage writing down assignments and bringing home needed books.  Offer your child a well-balanced diet. Breakfast is especially important for school performance. Children should avoid drinks with caffeine including:  Soft drinks.  Coffee.  Tea.  However, some older children (adolescents) may find these drinks helpful in improving their attention.  Children with ADHD need consistent rules that they can understand and follow. If rules are followed, give small rewards. Children with ADHD often receive, and expect, criticism. Look for good behavior and praise it. Set realistic goals. Give clear instructions. Look for activities that can foster success and self-esteem. Make time for pleasant activities with your child. Give lots of affection.  Parents are their children's greatest advocates. Learn as much as possible about ADHD. This helps you become a stronger and better advocate for your child. It also helps you educate your child's teachers and instructors if they feel inadequate in these areas. Parent support groups are often helpful. A national group with local chapters is called CHADD (Children and Adults with Attention Deficit Hyperactivity Disorder). PROGNOSIS  There is no cure for ADHD. Children with the disorder seldom outgrow it. Many find adaptive ways to accommodate the ADHD as they mature. SEEK MEDICAL CARE IF:  Your child has repeated muscle twitches, cough or speech outbursts.  Your child has sleep problems.  Your child has a marked loss of  appetite.  Your child develops depression.  Your child has new or worsening behavioral problems.  Your child develops dizziness.  Your child has a racing heart.  Your child has stomach pains.  Your child develops headaches. Document Released: 12/16/2001 Document Revised: 03/20/2011 Document Reviewed: 07/17/2012 [facility] Patient Information 2014 [place], [place]. Generalized Anxiety Disorder Generalized anxiety disorder (GAD) is a mental disorder. It interferes with life functions, including relationships, work, and school. GAD is different from normal anxiety, which everyone experiences at some point in their lives in response to specific life events and activities. Normal anxiety actually helps [place] prepare for and get through these life events and activities. Normal anxiety goes away after the event or activity is over.  GAD causes anxiety that is not necessarily related to specific events or activities. It also causes excess anxiety in proportion to specific events or activities. The anxiety associated with GAD is also difficult to control. GAD can vary from mild to severe. People with severe GAD can have intense waves of anxiety with physical symptoms (panic attacks).  SYMPTOMS The anxiety and worry associated with GAD are difficult to control. This anxiety and worry are related to many life events and activities and also occur more days than not for 6 months or longer. People with GAD also have three or more of the following symptoms (one or more in children):  Restlessness.   Fatigue.  Difficulty concentrating.   Irritability.  Muscle tension.  Difficulty sleeping or unsatisfying sleep. DIAGNOSIS GAD is diagnosed through an assessment by your caregiver. Your caregiver will ask you questions aboutyour mood,physical symptoms, and events  in your life. Your caregiver may ask you about your medical history and use of alcohol or drugs, including prescription medications. Your  caregiver may also do a physical exam and blood tests. Certain medical conditions and the use of certain substances can cause symptoms similar to those associated with GAD. Your caregiver may refer you to a mental health specialist for further evaluation. TREATMENT The following therapies are usually used to treat GAD:   Medication Antidepressant medication usually is prescribed for long-term daily control. Antianxiety medications may be added in severe cases, especially when panic attacks occur.   Talk therapy (psychotherapy) Certain types of talk therapy can be helpful in treating GAD by providing support, education, and guidance. A form of talk therapy called cognitive behavioral therapy can teach you healthy ways to think about and react to daily life events and activities.  Stress managementtechniques These include yoga, meditation, and exercise and can be very helpful when they are practiced regularly. A mental health specialist can help determine which treatment is best for you. Some people see improvement with one therapy. However, other people require a combination of therapies. Document Released: 04/22/2012 Document Reviewed: 04/22/2012 [facility] Patient Information 2014 [place], [place].

## 2012-12-06 NOTE — Progress Notes (Signed)
[facility] [address], [place] [address] [phone]  Date:  12/06/2012   Name:  Katherine [name]   DOB:  Jun 26, 1970   MRN:  [id number]  PCP:  Janace Hoard, MD    Chief Complaint: Back Pain, Anxiety, ADHD and Altered Mental Status   History of Present Illness:  Katherine [name] is a 42 y.o. very pleasant female patient who presents with the following:  Moved from [place] 2 years ago and says that she was under treatment for ADD, anxiety and chronic back pain.  She was being treated at [facility] but her doctor left the state.  She is out of her medications.  Has just started working a new job and has insurance.  Requests primary care relationship.  She has no medical records immediately available.  No improvement with over the counter medications or other home remedies. Denies other complaint or health concern today.   Patient Active Problem List   Diagnosis Date Noted  . Facial cellulitis 03/21/2012  . Anxiety disorder     Past Medical History  Diagnosis Date  . ADD (attention deficit disorder)   . Anemia   . Blood transfusion without reported diagnosis   . Anxiety disorder     panic  . Anxiety   . MRSA (methicillin resistant Staphylococcus aureus)     Past Surgical History  Procedure Laterality Date  . Gastric bypass    . Cesarean section    . Breast enhancement surgery      History  Substance Use Topics  . Smoking status: Current Every Day Smoker -- 1.00 packs/day for 15 years    Types: Cigarettes  . Smokeless tobacco: Never Used  . Alcohol Use: No    Family History  Problem Relation Age of Onset  . Other Other   . Other Other     Allergies  Allergen Reactions  . Nsaids     Anemia, and Gastric bypass  . Promethazine Hcl   . Tramadol Rash    Medication list has been reviewed and updated.  Current Outpatient Prescriptions on File Prior to Visit  Medication Sig Dispense Refill  . ALPRAZolam (XANAX) 0.5 MG tablet  Take 1 mg by mouth at bedtime as needed for sleep.      Marland Kitchen amphetamine-dextroamphetamine (ADDERALL) 30 MG tablet Take 30 mg by mouth 2 (two) times daily.       Marland Kitchen HYDROcodone-acetaminophen (NORCO/VICODIN) 5-325 MG per tablet Take 1 tablet by mouth every 4 (four) hours as needed.  10 tablet  0  . ranitidine (ZANTAC) 150 MG tablet Take 150 mg by mouth 2 (two) times daily as needed for heartburn.      . sertraline (ZOLOFT) 50 MG tablet Take 50 mg by mouth daily.       No current facility-administered medications on file prior to visit.    Review of Systems:  As per HPI, otherwise negative.    Physical Examination: Filed Vitals:   12/06/12 1747  BP: 138/82  Pulse: 103  Temp: 98 F (36.7 C)  Resp: 17   Filed Vitals:   12/06/12 1747  Height: 5' 5.5" (1.664 m)  Weight: 106 lb (48.081 kg)   Body mass index is 17.36 kg/(m^2). Ideal Body Weight: Weight in (lb) to have BMI = 25: 152.2  GEN: WDWN, NAD, Non-toxic, A & O x 3 HEENT: Atraumatic, Normocephalic. Neck supple. No masses, No LAD. Ears and Nose: No external deformity. CV: RRR, No M/G/R. No JVD. No  thrill. No extra heart sounds. PULM: CTA B, no wheezes, crackles, rhonchi. No retractions. No resp. distress. No accessory muscle use. ABD: S, NT, ND, +BS. No rebound. No HSM. EXTR: No c/c/e NEURO Normal gait.  PSYCH: Normally interactive. Conversant. Not depressed or anxious appearing.  Calm demeanor.  BACK:  Mild tenderness right lower back.  Neuro intact  Assessment and Plan: Chronic right lower back pain Anxiety disorder ADD Instructed to bring her medical records substantiating her need for medications.  It looks like she has been receiving 30 vicodin monthly.   She was given a fill for 30 days pending medical records.   Signed,  Phillips Odor, MD

## 2012-12-27 ENCOUNTER — Other Ambulatory Visit: Payer: Self-pay | Admitting: Emergency Medicine

## 2013-03-27 ENCOUNTER — Emergency Department (HOSPITAL_BASED_OUTPATIENT_CLINIC_OR_DEPARTMENT_OTHER): Payer: PRIVATE HEALTH INSURANCE

## 2013-03-27 ENCOUNTER — Emergency Department (HOSPITAL_BASED_OUTPATIENT_CLINIC_OR_DEPARTMENT_OTHER)
Admission: EM | Admit: 2013-03-27 | Discharge: 2013-03-27 | Disposition: A | Discharge status: Home / Self Care | Payer: PRIVATE HEALTH INSURANCE | Attending: Emergency Medicine | Admitting: Emergency Medicine

## 2013-03-27 ENCOUNTER — Encounter (HOSPITAL_BASED_OUTPATIENT_CLINIC_OR_DEPARTMENT_OTHER): Payer: Self-pay | Admitting: Emergency Medicine

## 2013-03-27 DIAGNOSIS — F17200 Nicotine dependence, unspecified, uncomplicated: Secondary | ICD-10-CM | POA: Insufficient documentation

## 2013-03-27 DIAGNOSIS — F988 Other specified behavioral and emotional disorders with onset usually occurring in childhood and adolescence: Secondary | ICD-10-CM | POA: Insufficient documentation

## 2013-03-27 DIAGNOSIS — R112 Nausea with vomiting, unspecified: Secondary | ICD-10-CM

## 2013-03-27 DIAGNOSIS — Z8614 Personal history of Methicillin resistant Staphylococcus aureus infection: Secondary | ICD-10-CM | POA: Insufficient documentation

## 2013-03-27 DIAGNOSIS — Z862 Personal history of diseases of the blood and blood-forming organs and certain disorders involving the immune mechanism: Secondary | ICD-10-CM | POA: Insufficient documentation

## 2013-03-27 DIAGNOSIS — K5900 Constipation, unspecified: Secondary | ICD-10-CM | POA: Insufficient documentation

## 2013-03-27 DIAGNOSIS — Z3202 Encounter for pregnancy test, result negative: Secondary | ICD-10-CM | POA: Insufficient documentation

## 2013-03-27 DIAGNOSIS — R630 Anorexia: Secondary | ICD-10-CM | POA: Insufficient documentation

## 2013-03-27 DIAGNOSIS — R109 Unspecified abdominal pain: Secondary | ICD-10-CM

## 2013-03-27 DIAGNOSIS — R42 Dizziness and giddiness: Secondary | ICD-10-CM | POA: Insufficient documentation

## 2013-03-27 DIAGNOSIS — Z79899 Other long term (current) drug therapy: Secondary | ICD-10-CM | POA: Insufficient documentation

## 2013-03-27 DIAGNOSIS — F410 Panic disorder [episodic paroxysmal anxiety] without agoraphobia: Secondary | ICD-10-CM | POA: Insufficient documentation

## 2013-03-27 DIAGNOSIS — R6883 Chills (without fever): Secondary | ICD-10-CM | POA: Insufficient documentation

## 2013-03-27 LAB — COMPREHENSIVE METABOLIC PANEL WITH GFR
ALT: 12 U/L (ref 0–35)
AST: 21 U/L (ref 0–37)
Albumin: 3.7 g/dL (ref 3.5–5.2)
Alkaline Phosphatase: 69 U/L (ref 39–117)
BUN: 20 mg/dL (ref 6–23)
CO2: 29 meq/L (ref 19–32)
Calcium: 9.5 mg/dL (ref 8.4–10.5)
Chloride: 97 meq/L (ref 96–112)
Creatinine, Ser: 0.6 mg/dL (ref 0.50–1.10)
GFR calc Af Amer: >90 mL/min
GFR calc non Af Amer: >90 mL/min
Glucose, Bld: 117 mg/dL — ABNORMAL HIGH (ref 70–99)
Potassium: 3.7 meq/L (ref 3.7–5.3)
Sodium: 138 meq/L (ref 137–147)
Total Bilirubin: 0.3 mg/dL (ref 0.3–1.2)
Total Protein: 7 g/dL (ref 6.0–8.3)

## 2013-03-27 LAB — CBC WITH DIFFERENTIAL/PLATELET
Basophils Absolute: 0.1 K/uL (ref 0.0–0.1)
Basophils Relative: 2 % — ABNORMAL HIGH (ref 0–1)
Eosinophils Absolute: 0.1 K/uL (ref 0.0–0.7)
Eosinophils Relative: 2 % (ref 0–5)
HCT: 32.4 % — ABNORMAL LOW (ref 36.0–46.0)
Hemoglobin: 9.9 g/dL — ABNORMAL LOW (ref 12.0–15.0)
Lymphocytes Relative: 21 % (ref 12–46)
Lymphs Abs: 1 K/uL (ref 0.7–4.0)
MCH: 24.8 pg — ABNORMAL LOW (ref 26.0–34.0)
MCHC: 30.6 g/dL (ref 30.0–36.0)
MCV: 81 fL (ref 78.0–100.0)
Monocytes Absolute: 0.3 K/uL (ref 0.1–1.0)
Monocytes Relative: 7 % (ref 3–12)
Neutro Abs: 3.2 K/uL (ref 1.7–7.7)
Neutrophils Relative %: 69 % (ref 43–77)
Platelets: 299 K/uL (ref 150–400)
RBC: 4 MIL/uL (ref 3.87–5.11)
RDW: 15.2 % (ref 11.5–15.5)
WBC: 4.6 K/uL (ref 4.0–10.5)

## 2013-03-27 LAB — PREGNANCY, URINE: Preg Test, Ur: NEGATIVE

## 2013-03-27 LAB — URINALYSIS, ROUTINE W REFLEX MICROSCOPIC
Glucose, UA: NEGATIVE mg/dL
Hgb urine dipstick: NEGATIVE
Ketones, ur: 15 mg/dL — AB
Nitrite: NEGATIVE
Protein, ur: NEGATIVE mg/dL
Specific Gravity, Urine: 1.025 (ref 1.005–1.030)
Urobilinogen, UA: 1 mg/dL (ref 0.0–1.0)
pH: 7 (ref 5.0–8.0)

## 2013-03-27 LAB — URINE MICROSCOPIC-ADD ON

## 2013-03-27 LAB — LIPASE, BLOOD: Lipase: 49 U/L (ref 11–59)

## 2013-03-27 MED ORDER — ONDANSETRON HCL 4 MG/2ML IJ SOLN
4.0000 mg | Freq: Once | INTRAMUSCULAR | Status: AC
  Administered 2013-03-27: 4 mg via INTRAVENOUS
  Filled 2013-03-27: qty 2

## 2013-03-27 MED ORDER — IOHEXOL 300 MG/ML  SOLN
100.0000 mL | Freq: Once | INTRAMUSCULAR | Status: AC | PRN
  Administered 2013-03-27: 100 mL via INTRAVENOUS

## 2013-03-27 MED ORDER — IOHEXOL 300 MG/ML  SOLN
50.0000 mL | Freq: Once | INTRAMUSCULAR | Status: AC | PRN
  Administered 2013-03-27: 50 mL via ORAL

## 2013-03-27 MED ORDER — HYDROMORPHONE HCL PF 1 MG/ML IJ SOLN
0.5000 mg | Freq: Once | INTRAMUSCULAR | Status: AC
  Administered 2013-03-27: 0.5 mg via INTRAVENOUS
  Filled 2013-03-27: qty 1

## 2013-03-27 MED ORDER — SODIUM CHLORIDE 0.9 % IV BOLUS (SEPSIS)
1000.0000 mL | Freq: Once | INTRAVENOUS | Status: AC
  Administered 2013-03-27: 1000 mL via INTRAVENOUS

## 2013-03-27 MED ORDER — ONDANSETRON HCL 4 MG PO TABS: 4.0000 mg | ORAL_TABLET | Freq: Four times a day (QID) | ORAL | Status: DC

## 2013-03-27 MED ORDER — POLYETHYLENE GLYCOL 3350 17 G PO PACK: 17.0000 g | PACK | Freq: Every day | ORAL | Status: DC

## 2013-03-27 NOTE — ED Provider Notes (Signed)
CSN: [id number]     Arrival date & time 03/27/13  1458 History   First MD Initiated Contact with Patient 03/27/13 1537     Chief Complaint  Patient presents with  . Abdominal Pain     (Consider location/radiation/quality/duration/timing/severity/associated sxs/prior Treatment) HPI Comments: The patient is a 43 year-old female who presents with complaints of right posterior shoulder pain, abdominal pain, nausea, vomiting, constipation x 6 days.  She describes the abdominal pain as descending from her throat down into her stomach, accompanied with lower abdominal pain. She tried zofran which did not relieve her nausea and vomiting more than 2 hours. She says she is abstinent and there is no way she could be pregnant at this time.   Her right posterior shoulder pain is constant, ranked 9/10, and has persisted for the past 6 days.  Standing up and walking makes her lightheaded. Her mother was recently sick with a stomach virus so she originally thought she caught it. Now she feels extremely dehydrated and has recently lost more than 10 pounds in 10 days without trying so she came to be seen. She previously had bariatric surgery and lost 100 pounds. She then lost another 25 pounds when she went through a divorce and moved.  Patient is a 43 y.o. female presenting with abdominal pain. The history is provided by the patient. No language interpreter was used.  Abdominal Pain Associated symptoms: chills, constipation, nausea and vomiting   Associated symptoms: no chest pain, no cough, no diarrhea, no dysuria, no fever, no shortness of breath and no sore throat     Past Medical History  Diagnosis Date  . ADD (attention deficit disorder)   . Anemia   . Blood transfusion without reported diagnosis   . Anxiety disorder     panic  . Anxiety   . MRSA (methicillin resistant Staphylococcus aureus)    Past Surgical History  Procedure Laterality Date  . Gastric bypass    . Cesarean section    .  Breast enhancement surgery     Family History  Problem Relation Age of Onset  . Other Other   . Other Other    History  Substance Use Topics  . Smoking status: Current Every Day Smoker -- 1.00 packs/day for 15 years    Types: Cigarettes  . Smokeless tobacco: Never Used  . Alcohol Use: No   OB History   Grav Para Term Preterm Abortions TAB SAB Ect Mult Living                 Review of Systems  Constitutional: Positive for chills and appetite change. Negative for fever.       Patient reports decreased concentration and increased forgetfulness.   HENT: Negative for congestion, ear pain, hearing loss, rhinorrhea and sore throat.   Eyes: Negative for visual disturbance.  Respiratory: Negative for cough and shortness of breath.   Cardiovascular: Negative for chest pain.  Gastrointestinal: Positive for nausea, vomiting, abdominal pain and constipation. Negative for diarrhea and blood in stool.  Genitourinary: Negative for dysuria.  Neurological: Positive for light-headedness.      Allergies  Nsaids; Promethazine hcl; and Tramadol  Home Medications   Current Outpatient Rx  Name  Route  Sig  Dispense  Refill  . ALPRAZolam (XANAX) 0.5 MG tablet      TAKE 2 TABLETS BY MOUTH AT BEDTIME AS NEEDED FOR SLEEP   30 tablet   0   . amphetamine-dextroamphetamine (ADDERALL) 30 MG tablet   Oral  Take 1 tablet (30 mg total) by mouth 2 (two) times daily.   60 tablet   0   . HYDROcodone-acetaminophen (NORCO/VICODIN) 5-325 MG per tablet   Oral   Take 1 tablet by mouth every 4 (four) hours as needed.   30 tablet   0   . omeprazole (PRILOSEC) 40 MG capsule   Oral   Take 1 capsule (40 mg total) by mouth daily.   30 capsule   3   . ranitidine (ZANTAC) 150 MG tablet   Oral   Take 150 mg by mouth 2 (two) times daily as needed for heartburn.         . sertraline (ZOLOFT) 50 MG tablet   Oral   Take 1 tablet (50 mg total) by mouth daily.   30 tablet   5    BP 106/76  Pulse  94  Temp(Src) 98.2 F (36.8 C) (Oral)  Resp 16  Ht 5\' 4"  (1.626 m)  Wt 94 lb 1 oz (42.666 kg)  BMI 16.14 kg/m2  SpO2 100%  LMP 03/17/2013 Physical Exam  Constitutional:  Patient appears cachectic.   HENT:  Mouth/Throat: Mucous membranes are not dry.  Eyes: Conjunctivae and EOM are normal. Pupils are equal, round, and reactive to light. Right eye exhibits no nystagmus. Left eye exhibits no nystagmus.  Cardiovascular: Normal rate and normal heart sounds.   Pulmonary/Chest: Effort normal and breath sounds normal. No accessory muscle usage. Not tachypneic and not bradypneic. She exhibits no tenderness.  Abdominal: Soft. Bowel sounds are normal. There is tenderness in the epigastric area and left lower quadrant. There is no guarding and no CVA tenderness.    Musculoskeletal:       Arms: Neurological: She is alert.  Skin: Skin is warm and dry.    ED Course  Procedures (including critical care time) Labs Review Labs Reviewed - No data to display Imaging Review No results found. Results for orders placed during the hospital encounter of 03/27/13  CBC WITH DIFFERENTIAL      Result Value Ref Range   WBC 4.6  4.0 - 10.5 K/uL   RBC 4.00  3.87 - 5.11 MIL/uL   Hemoglobin 9.9 (*) 12.0 - 15.0 g/dL   HCT 32.4 (*) 36.0 - 46.0 %   MCV 81.0  78.0 - 100.0 fL   MCH 24.8 (*) 26.0 - 34.0 pg   MCHC 30.6  30.0 - 36.0 g/dL   RDW 15.2  11.5 - 15.5 %   Platelets 299  150 - 400 K/uL   Neutrophils Relative % 69  43 - 77 %   Neutro Abs 3.2  1.7 - 7.7 K/uL   Lymphocytes Relative 21  12 - 46 %   Lymphs Abs 1.0  0.7 - 4.0 K/uL   Monocytes Relative 7  3 - 12 %   Monocytes Absolute 0.3  0.1 - 1.0 K/uL   Eosinophils Relative 2  0 - 5 %   Eosinophils Absolute 0.1  0.0 - 0.7 K/uL   Basophils Relative 2 (*) 0 - 1 %   Basophils Absolute 0.1  0.0 - 0.1 K/uL  COMPREHENSIVE METABOLIC PANEL      Result Value Ref Range   Sodium 138  137 - 147 mEq/L   Potassium 3.7  3.7 - 5.3 mEq/L   Chloride 97  96 - 112  mEq/L   CO2 29  19 - 32 mEq/L   Glucose, Bld 117 (*) 70 - 99 mg/dL   BUN 20  6 - 23 mg/dL   Creatinine, Ser 0.60  0.50 - 1.10 mg/dL   Calcium 9.5  8.4 - 10.5 mg/dL   Total Protein 7.0  6.0 - 8.3 g/dL   Albumin 3.7  3.5 - 5.2 g/dL   AST 21  0 - 37 U/L   ALT 12  0 - 35 U/L   Alkaline Phosphatase 69  39 - 117 U/L   Total Bilirubin 0.3  0.3 - 1.2 mg/dL   GFR calc non Af Amer >90  >90 mL/min   GFR calc Af Amer >90  >90 mL/min  LIPASE, BLOOD      Result Value Ref Range   Lipase 49  11 - 59 U/L  PREGNANCY, URINE      Result Value Ref Range   Preg Test, Ur NEGATIVE  NEGATIVE  URINALYSIS, ROUTINE W REFLEX MICROSCOPIC      Result Value Ref Range   Color, Urine AMBER (*) YELLOW   APPearance CLOUDY (*) CLEAR   Specific Gravity, Urine 1.025  1.005 - 1.030   pH 7.0  5.0 - 8.0   Glucose, UA NEGATIVE  NEGATIVE mg/dL   Hgb urine dipstick NEGATIVE  NEGATIVE   Bilirubin Urine SMALL (*) NEGATIVE   Ketones, ur 15 (*) NEGATIVE mg/dL   Protein, ur NEGATIVE  NEGATIVE mg/dL   Urobilinogen, UA 1.0  0.0 - 1.0 mg/dL   Nitrite NEGATIVE  NEGATIVE   Leukocytes, UA TRACE (*) NEGATIVE  URINE MICROSCOPIC-ADD ON      Result Value Ref Range   Squamous Epithelial / LPF FEW (*) RARE   WBC, UA 0-2  <3 WBC/hpf   Bacteria, UA RARE  RARE   Urine-Other MUCOUS PRESENT     Ct Abdomen Pelvis W Contrast  03/27/2013   CLINICAL DATA:  Abdominal pain.  EXAM: CT ABDOMEN AND PELVIS WITH CONTRAST  TECHNIQUE: Multidetector CT imaging of the abdomen and pelvis was performed using the standard protocol following bolus administration of intravenous contrast.  CONTRAST:  62mL OMNIPAQUE IOHEXOL 300 MG/ML SOLN, 14mL OMNIPAQUE IOHEXOL 300 MG/ML SOLN  COMPARISON:  09/17/2012  FINDINGS: BODY WALL: Bilateral breast implants, partly imaged.  LOWER CHEST: Unremarkable.  ABDOMEN/PELVIS:  Liver: No focal abnormality.  Biliary: No evidence of biliary obstruction or stone.  Pancreas: Unremarkable.  Spleen: Unremarkable.  Adrenals:  Unremarkable.  Kidneys and ureters: No hydronephrosis or stone.  Bladder: Limited assessment due to decompressed state.  Reproductive: Unremarkable.  Bowel: Large volume of formed stool, throughout the colon, with distention. 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 No evidence of gastrogastric fistula, obstruction, or internal hernia. Limited intra-abdominal fat limits visualization of the appendix. There is no evidence of right lower quadrant inflammation.  Retroperitoneum: No mass or adenopathy.  Peritoneum: No free fluid or gas.  Vascular: No acute abnormality.  OSSEOUS: Degenerative disc disease with moderate broad disc bulging at L4-5.  IMPRESSION: 1. Constipation. 2. The appendix is not visualized, but there is no evidence of pericecal inflammation. 3. Gastric bypass.  No bowel obstruction.   Electronically Signed   By: Jorje Guild M.D.   On: 03/27/2013 20:44    EKG Interpretation None      MDM   Final diagnoses:  None    1. Constipation 2. Abdominal pain 3. Nausea and vomiting  No further vomiting in ED. CT scan shows no obstruction, only constipation. She has been comfortable here with medications. VSS.   Dewaine Oats, PA-C 03/27/13 2150

## 2013-03-27 NOTE — ED Notes (Signed)
Pt ambulating independently w/ steady gait on d/c in no acute distress, A&Ox4. D/c instructions reviewed w/ pt and family - pt and family deny any further questions or concerns at present. Rx given x2  

## 2013-03-27 NOTE — Discharge Instructions (Signed)

## 2013-03-27 NOTE — ED Notes (Signed)
Patient transported to CT 

## 2013-03-27 NOTE — ED Provider Notes (Signed)
Medical screening examination/treatment/procedure(s) were performed by non-physician practitioner and as supervising physician I was immediately available for consultation/collaboration.  Carmin Muskrat, MD 03/27/13 2234

## 2013-03-27 NOTE — ED Notes (Signed)
Patient updated on status.

## 2013-03-27 NOTE — ED Notes (Signed)
C/o right posterior shoulder pain, abd pain, vomiting, constipation x 1.5 weeks

## 2013-03-30 ENCOUNTER — Encounter (HOSPITAL_COMMUNITY): Payer: Self-pay | Admitting: *Deleted

## 2013-03-30 ENCOUNTER — Inpatient Hospital Stay (HOSPITAL_COMMUNITY)
Admission: EM | Admit: 2013-03-30 | Discharge: 2013-04-14 | DRG: 326 | Disposition: A | Discharge status: Home / Self Care | Payer: PRIVATE HEALTH INSURANCE | Attending: Surgery | Admitting: Surgery

## 2013-03-30 ENCOUNTER — Ambulatory Visit: Payer: PRIVATE HEALTH INSURANCE | Admitting: Family Medicine

## 2013-03-30 VITALS — BP 104/60 | HR 74 | Temp 98.3°F | Resp 16 | Wt 92.0 lb

## 2013-03-30 DIAGNOSIS — M25519 Pain in unspecified shoulder: Secondary | ICD-10-CM

## 2013-03-30 DIAGNOSIS — Z9884 Bariatric surgery status: Secondary | ICD-10-CM

## 2013-03-30 DIAGNOSIS — K047 Periapical abscess without sinus: Secondary | ICD-10-CM | POA: Diagnosis present

## 2013-03-30 DIAGNOSIS — Z681 Body mass index (BMI) 19 or less, adult: Secondary | ICD-10-CM

## 2013-03-30 DIAGNOSIS — E43 Unspecified severe protein-calorie malnutrition: Secondary | ICD-10-CM | POA: Diagnosis present

## 2013-03-30 DIAGNOSIS — R112 Nausea with vomiting, unspecified: Secondary | ICD-10-CM

## 2013-03-30 DIAGNOSIS — F988 Other specified behavioral and emotional disorders with onset usually occurring in childhood and adolescence: Secondary | ICD-10-CM | POA: Diagnosis present

## 2013-03-30 DIAGNOSIS — X58XXXA Exposure to other specified factors, initial encounter: Secondary | ICD-10-CM | POA: Diagnosis present

## 2013-03-30 DIAGNOSIS — K045 Chronic apical periodontitis: Secondary | ICD-10-CM | POA: Diagnosis present

## 2013-03-30 DIAGNOSIS — R634 Abnormal weight loss: Secondary | ICD-10-CM

## 2013-03-30 DIAGNOSIS — Z720 Tobacco use: Secondary | ICD-10-CM

## 2013-03-30 DIAGNOSIS — F411 Generalized anxiety disorder: Secondary | ICD-10-CM | POA: Diagnosis present

## 2013-03-30 DIAGNOSIS — R12 Heartburn: Secondary | ICD-10-CM

## 2013-03-30 DIAGNOSIS — R1110 Vomiting, unspecified: Secondary | ICD-10-CM | POA: Diagnosis present

## 2013-03-30 DIAGNOSIS — R1011 Right upper quadrant pain: Secondary | ICD-10-CM

## 2013-03-30 DIAGNOSIS — Z888 Allergy status to other drugs, medicaments and biological substances status: Secondary | ICD-10-CM

## 2013-03-30 DIAGNOSIS — D649 Anemia, unspecified: Secondary | ICD-10-CM | POA: Diagnosis present

## 2013-03-30 DIAGNOSIS — Z8614 Personal history of Methicillin resistant Staphylococcus aureus infection: Secondary | ICD-10-CM

## 2013-03-30 DIAGNOSIS — K5900 Constipation, unspecified: Secondary | ICD-10-CM | POA: Diagnosis present

## 2013-03-30 DIAGNOSIS — K289 Gastrojejunal ulcer, unspecified as acute or chronic, without hemorrhage or perforation: Principal | ICD-10-CM | POA: Diagnosis present

## 2013-03-30 DIAGNOSIS — R42 Dizziness and giddiness: Secondary | ICD-10-CM

## 2013-03-30 DIAGNOSIS — S025XXA Fracture of tooth (traumatic), initial encounter for closed fracture: Secondary | ICD-10-CM | POA: Diagnosis present

## 2013-03-30 DIAGNOSIS — F17200 Nicotine dependence, unspecified, uncomplicated: Secondary | ICD-10-CM | POA: Diagnosis present

## 2013-03-30 DIAGNOSIS — R64 Cachexia: Secondary | ICD-10-CM

## 2013-03-30 DIAGNOSIS — E869 Volume depletion, unspecified: Secondary | ICD-10-CM

## 2013-03-30 DIAGNOSIS — F419 Anxiety disorder, unspecified: Secondary | ICD-10-CM | POA: Diagnosis present

## 2013-03-30 DIAGNOSIS — K311 Adult hypertrophic pyloric stenosis: Secondary | ICD-10-CM | POA: Diagnosis present

## 2013-03-30 DIAGNOSIS — Z79899 Other long term (current) drug therapy: Secondary | ICD-10-CM

## 2013-03-30 DIAGNOSIS — K029 Dental caries, unspecified: Secondary | ICD-10-CM | POA: Diagnosis present

## 2013-03-30 HISTORY — DX: Pneumonia, unspecified organism: J18.9

## 2013-03-30 LAB — COMPREHENSIVE METABOLIC PANEL
ALBUMIN: 4.6 g/dL (ref 3.5–5.2)
ALT: 12 U/L (ref 0–35)
AST: 28 U/L (ref 0–37)
Alkaline Phosphatase: 85 U/L (ref 39–117)
BUN: 21 mg/dL (ref 6–23)
CALCIUM: 10 mg/dL (ref 8.4–10.5)
CO2: 28 mEq/L (ref 19–32)
Chloride: 97 mEq/L (ref 96–112)
Creatinine, Ser: 0.58 mg/dL (ref 0.50–1.10)
GFR calc non Af Amer: >90 mL/min (ref >90)
Glucose, Bld: 89 mg/dL (ref 70–99)
Potassium: 4.1 mEq/L (ref 3.7–5.3)
SODIUM: 141 meq/L (ref 137–147)
TOTAL PROTEIN: 8.6 g/dL — AB (ref 6.0–8.3)
Total Bilirubin: 0.4 mg/dL (ref 0.3–1.2)

## 2013-03-30 LAB — CBC WITH DIFFERENTIAL/PLATELET
BASOS PCT: 1 % (ref 0–1)
Basophils Absolute: 0.1 10*3/uL (ref 0.0–0.1)
EOS ABS: 0.1 10*3/uL (ref 0.0–0.7)
EOS PCT: 2 % (ref 0–5)
HCT: 39.4 % (ref 36.0–46.0)
Hemoglobin: 12.2 g/dL (ref 12.0–15.0)
Lymphocytes Relative: 28 % (ref 12–46)
Lymphs Abs: 1.5 10*3/uL (ref 0.7–4.0)
MCH: 24.8 pg — AB (ref 26.0–34.0)
MCHC: 31 g/dL (ref 30.0–36.0)
MCV: 80.1 fL (ref 78.0–100.0)
Monocytes Absolute: 0.3 10*3/uL (ref 0.1–1.0)
Monocytes Relative: 6 % (ref 3–12)
Neutro Abs: 3.4 10*3/uL (ref 1.7–7.7)
Neutrophils Relative %: 64 % (ref 43–77)
Platelets: 368 10*3/uL (ref 150–400)
RBC: 4.92 MIL/uL (ref 3.87–5.11)
RDW: 15.4 % (ref 11.5–15.5)
WBC: 5.3 10*3/uL (ref 4.0–10.5)

## 2013-03-30 MED ORDER — THIAMINE HCL 100 MG/ML IJ SOLN
100.0000 mg | Freq: Every day | INTRAMUSCULAR | Status: DC
  Administered 2013-03-30 – 2013-04-10 (×11): 100 mg via INTRAVENOUS
  Filled 2013-03-30 (×13): qty 1

## 2013-03-30 MED ORDER — AMPHETAMINE-DEXTROAMPHETAMINE 10 MG PO TABS
30.0000 mg | ORAL_TABLET | Freq: Two times a day (BID) | ORAL | Status: DC
Start: 2013-03-30 — End: 2013-04-14
  Administered 2013-04-01 – 2013-04-14 (×23): 30 mg via ORAL
  Filled 2013-03-30 (×24): qty 3

## 2013-03-30 MED ORDER — M.V.I. ADULT IV INJ
INTRAVENOUS | Status: DC
  Administered 2013-03-30: 21:00:00 via INTRAVENOUS
  Filled 2013-03-30: qty 10

## 2013-03-30 MED ORDER — SERTRALINE HCL 50 MG PO TABS
50.0000 mg | ORAL_TABLET | Freq: Every day | ORAL | Status: DC
  Administered 2013-04-01 – 2013-04-14 (×13): 50 mg via ORAL
  Filled 2013-03-30 (×16): qty 1

## 2013-03-30 MED ORDER — HYDROCODONE-ACETAMINOPHEN 5-325 MG PO TABS
1.0000 | ORAL_TABLET | ORAL | Status: DC | PRN
  Administered 2013-04-05 (×3): 2 via ORAL
  Filled 2013-03-30 (×3): qty 2

## 2013-03-30 MED ORDER — MORPHINE SULFATE 2 MG/ML IJ SOLN
1.0000 mg | INTRAMUSCULAR | Status: DC | PRN
  Administered 2013-03-30: 2 mg via INTRAVENOUS
  Administered 2013-03-31: 3 mg via INTRAVENOUS
  Administered 2013-03-31 – 2013-04-02 (×15): 2 mg via INTRAVENOUS
  Administered 2013-04-02: 1 mg via INTRAVENOUS
  Administered 2013-04-02 (×5): 2 mg via INTRAVENOUS
  Administered 2013-04-02: 1 mg via INTRAVENOUS
  Administered 2013-04-02: 2 mg via INTRAVENOUS
  Filled 2013-03-30 (×22): qty 1
  Filled 2013-03-30: qty 2
  Filled 2013-03-30 (×2): qty 1

## 2013-03-30 MED ORDER — PANTOPRAZOLE SODIUM 40 MG IV SOLR
40.0000 mg | Freq: Two times a day (BID) | INTRAVENOUS | Status: DC
  Administered 2013-03-30 – 2013-04-04 (×10): 40 mg via INTRAVENOUS
  Filled 2013-03-30 (×11): qty 40

## 2013-03-30 MED ORDER — ONDANSETRON HCL 4 MG PO TABS
4.0000 mg | ORAL_TABLET | Freq: Four times a day (QID) | ORAL | Status: DC
  Administered 2013-04-01 – 2013-04-04 (×9): 4 mg via ORAL
  Filled 2013-03-30 (×22): qty 1

## 2013-03-30 MED ORDER — KCL IN DEXTROSE-NACL 20-5-0.45 MEQ/L-%-% IV SOLN
INTRAVENOUS | Status: AC
Start: 2013-03-30 — End: 2013-04-04
  Administered 2013-03-30 – 2013-04-03 (×8): via INTRAVENOUS
  Administered 2013-04-03: 1 mL via INTRAVENOUS
  Administered 2013-04-04: 13:00:00 via INTRAVENOUS
  Filled 2013-03-30 (×21): qty 1000

## 2013-03-30 MED ORDER — ONDANSETRON HCL 4 MG/2ML IJ SOLN
4.0000 mg | Freq: Four times a day (QID) | INTRAMUSCULAR | Status: DC | PRN
  Administered 2013-03-30 – 2013-04-09 (×12): 4 mg via INTRAVENOUS
  Filled 2013-03-30 (×12): qty 2

## 2013-03-30 NOTE — Patient Instructions (Addendum)
Go to [facility] ER for evaluation and possible admission by Dr. Lucia Gaskins. If follow up needed by primary care provider after the hospitalization, can return here.

## 2013-03-30 NOTE — ED Notes (Signed)
Bed: WA03 Expected date:  Expected time:  Means of arrival:  Comments: 

## 2013-03-30 NOTE — ED Notes (Signed)
Patient came in from urgent care. Patient c/o abdominal pain and unable to eat for last 2 weeks. Patient states she is unable to keep food down no longer than one hour.

## 2013-03-30 NOTE — H&P (Addendum)
Re:   Katherine [name] DOB:   1970-03-28 MRN:   [id number]  [facility] Admission  ASSESSMENT AND PLAN: 1.  Significant weight loss, nausea, vomiting  Possible marginal ulcer  Needs to hydrated and resuscitated.  Had labs just a few days ago, but will repeat.  Probably will need upper endo in the next day or two, depending on how she is doing.  I doubt internal hernia.  Could have something unrelated to RYGB, but I doubt it.  2.  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 Timmothy Sours in [place] (was with [facility]), but she said that he has moved.  She has not seen him in at least 5 years. 3.  Malnourished.  Check prealbumin, give thiamine and MVI. 4.  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  Anxiety disorder 6.  Hgb - 9.9 - on 03/27/2013 7.  Smokes cigarettes - approx 1 ppd  She knows that with a gastric bypass this can lead to significant complications.  Chief Complaint  Patient presents with  . Abdominal Pain   REFERRING PHYSICIAN:  Shea Stakes, MD, [facility]  HISTORY OF PRESENT ILLNESS: Katherine [name] is a 43 y.o. (DOB: 1970-05-14)  white  female whose primary care physician is Dr. Annye Asa [name]) and comes to me today for nausea/vomiting and weight loss x 2 weeks. She is by herself, though her mother was here recently.  [name] had a gastric bypass in [place] by Dr. Timmothy Sours in 2007.  She said that her initial weight was 232.  Her weight was stable in the 130's until her divorce a couple of years ago, then her weight went to the 110's.  She was doing well until 2 weeks ago.  Then about 2 weeks ago, she developed some right shoulder and back pain, she could not hold anything down, was constipated, and has lost 10 to 15 more pounds.  She says that at best she can eat small amounts of food. She went to [facility] off of [address] on Thursday, 03/27/2013.  They did a CT scan and thought that she was just constipated.  It looks like she was having more abdominal pain at that visit than she  is complaining of now. Because of continued inability to keep things down, she went to [facility] today.  Dr. Shea [name] called me about her condition. She has never had an upper endo.  She takes Prilosec. She smokes >1 ppd, but has cut back recently because she does not feel good. She had plastic surgery in [place] for augmentation mammoplasty and abodminoplasty in around 2009.  She required a blood transfusion after that procedure, but has not known of any anemia since then.   Past Medical History  Diagnosis Date  . ADD (attention deficit disorder)   . Anemia   . Blood transfusion without reported diagnosis   . Anxiety disorder     panic  . Anxiety   . MRSA (methicillin resistant Staphylococcus aureus)       Past Surgical History  Procedure Laterality Date  . Gastric bypass    . Cesarean section    . Breast enhancement surgery        No current facility-administered medications for this encounter.   Current Outpatient Prescriptions  Medication Sig Dispense Refill  . amphetamine-dextroamphetamine (ADDERALL) 30 MG tablet Take 1 tablet (30 mg total) by mouth 2 (two) times daily.  60 tablet  0  . clonazePAM (KLONOPIN)  0.5 MG tablet Take 1 tablet by mouth at bedtime as needed. Sleep/anxiety      . omeprazole (PRILOSEC) 40 MG capsule Take 1 capsule (40 mg total) by mouth daily.  30 capsule  3  . ondansetron (ZOFRAN) 4 MG tablet Take 1 tablet (4 mg total) by mouth every 6 (six) hours.  12 tablet  0  . polyethylene glycol (MIRALAX) packet Take 17 g by mouth daily. Max 3 consecutive days  3 each  0  . sertraline (ZOLOFT) 50 MG tablet Take 1 tablet (50 mg total) by mouth daily.  30 tablet  5  . SUBOXONE 8-2 MG FILM Place 1 Film under the tongue daily.          Allergies  Allergen Reactions  . Nsaids     Anemia, and Gastric bypass  . Promethazine Hcl Other (See Comments)    Agitated, restless  . Tramadol Rash    REVIEW OF SYSTEMS: Skin:  No history of rash.  No  history of abnormal moles. Infection:  No history of hepatitis or HIV.  History of MRSA (in Epic). Neurologic:  No history of stroke.  No history of seizure.  No history of headaches. Cardiac:  No history of hypertension. No history of heart disease.  No history of prior cardiac catheterization.  No history of seeing a cardiologist. Pulmonary:  Smokes > 1 ppd until recently.  She understands that this is dangerous for her health and her history of gastric bypass.  She thinks she re-started smoking around 2011.  Endocrine:  No diabetes. No thyroid disease. Gastrointestinal:  See HPI.  No history of liver disease.  No history of gall bladder disease.  No history of pancreas disease.  No history of colon disease. Urologic:  No history of kidney stones.  No history of bladder infections. Musculoskeletal:  No history of joint or back disease. Hematologic:  No bleeding disorder.  No history of anemia.  Not anticoagulated. Psycho-social:  She has a history of ADD, anxiety, and depression.  She was being seen in [place] by a Dr. Raliegh Ip (she cannot pronounce his whole name) at [facility]. She is planning on moving her psych care up here, but has no identified provider.  SOCIAL and FAMILY HISTORY: Divorced. Lives with mother.  Katherine [name], who is pediatric nurse. Has one son, 69 yo, that splits time between his father who lives in [place], [place], and her. 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  Diane (mother) phone: [phone] Father Clair Gulling) phone:  [phone]  PHYSICAL EXAM: BP 117/89  Pulse 63  Temp(Src) 98.1 F (36.7 C) (Oral)  Resp 14  SpO2 100%  LMP 03/17/2013  General: Very thin WF who is alert.  HEENT: Normal. Pupils equal. Neck: Supple. No mass.  No thyroid mass. Lymph Nodes:  No supraclavicular or cervical nodes. Lungs: Clear to auscultation and symmetric breath sounds. Heart:  RRR. No murmur or rub. Breasts:  Bilateral  implants Abdomen: Soft. No mass. No tenderness. No hernia. Few bowel sounds.  Scaphoid.  Transverse scar from abdominoplasty. Rectal: Not done. Extremities:  Good strength and ROM  in upper and lower extremities. Neurologic:  Grossly intact to motor and sensory function. Psychiatric: Behavior is normal.   DATA REVIEWED: Epic notes  Alphonsa Overall, MD,  [facility], [address]  [place], [place]    [place] Phone:  [place]:  [phone]

## 2013-03-30 NOTE — Progress Notes (Addendum)
Subjective:   This chart was scribed for Merri Ray, MD by Forrestine Him, [facility] Scribe. This patient was seen in room 11 and the patient's care was started 5:11 PM.    Patient ID: Katherine [name], female    DOB: November 13, 1970, 43 y.o.   MRN: [id number]  HPI  HPI Comments: Katherine [name] is a 43 y.o. female who presents to the Emergency Department complaining of unexpected weight loss x 2 weeks. She reports losing 3 pounds in the last 3 days, and 24 pounds in the last 4 months. Pt also reports ongoing abdominal pain, nausea, hick ups (onset 1 week), dizziness, abdominal fullness, and vomiting (about 10 episodes daily). She states she is unable to keep anything down including fluids. After eating, she says the undigested food comes back up after 10-90 minutes. Pt states she had been taking Prilosec 40 mg daily, but recently switched to Maalox (1 packet daily) 3-4 days ago. Pt has been taking 4 mg of Zofran every 6 hours daily to help with vomiting. Last small BM today, prior to today pt had a BM about 2 weeks ago. She is urinating about 1-2 times a day consisting of dark yellow urine x 6 days. Denies trying to restrict calories in the past few months. Denies forceful vomiting. Denies any past diagnosis of eating disorders. At this time she denies any diarrhea, dark black stools, or fever. Pt is an every day smoker and smokes about 1 pack of cigarettes a day. Denies any alcohol or illicit drug use. She has a PSHx of gastric bypass in 2007 with possible band in [place], [place] (Dr. Domingo Dimes) and a abdominoplasty 2008. Pt reports having blood transfusions in the past after having her abdominoplasty.  Last seen 3 days ago in ED for abdominal pain. Accompanied by right shoulder pain at that time and constipation for 6 days. CBC noted to have hemoglobin of 9.9 (10.0 in august 2014, 9.7 in march 2014), normal WBC. CMP normal except glucose 117. Lipase normal. HCG negative. UA with 0-2 WBC  only and trace LE. CT of abdomen noted prior roux-en-Y gastric bypass. No bowel obstruction. No evidence of appendicitis but notable for constipation.  Weight 116 in ED 12/01/2012. Last visit at [facility] 12/06/12 weight 106 at that time.   Pt also reports chronic ongoing right shoulder pain x 2 weeks. Pt states she was seen at [facility] for this complaint.  Patient Active Problem List   Diagnosis Date Noted  . Facial cellulitis 03/21/2012  . Anxiety disorder    Past Medical History  Diagnosis Date  . ADD (attention deficit disorder)   . Anemia   . Blood transfusion without reported diagnosis   . Anxiety disorder     panic  . Anxiety   . MRSA (methicillin resistant Staphylococcus aureus)    Past Surgical History  Procedure Laterality Date  . Gastric bypass    . Cesarean section    . Breast enhancement surgery     Allergies  Allergen Reactions  . Nsaids     Anemia, and Gastric bypass  . Promethazine Hcl   . Tramadol Rash   Prior to Admission medications   Medication Sig Start Date End Date Taking? Authorizing Provider  ALPRAZolam Duanne Moron) 0.5 MG tablet TAKE 2 TABLETS BY MOUTH AT BEDTIME AS NEEDED FOR SLEEP 12/27/12  Yes [name], MD  amphetamine-dextroamphetamine (ADDERALL) 30 MG tablet Take 1 tablet (30 mg total) by mouth 2 (two) times daily.  12/06/12  Yes [name], MD  omeprazole (PRILOSEC) 40 MG capsule Take 1 capsule (40 mg total) by mouth daily. 12/06/12  Yes [name], MD  ondansetron (ZOFRAN) 4 MG tablet Take 1 tablet (4 mg total) by mouth every 6 (six) hours. 03/27/13  Yes Shari A Upstill, PA-C  polyethylene glycol (MIRALAX) packet Take 17 g by mouth daily. Max 3 consecutive days 03/27/13  Yes Shari A Upstill, PA-C  sertraline (ZOLOFT) 50 MG tablet Take 1 tablet (50 mg total) by mouth daily. 12/06/12  Yes [name], MD  HYDROcodone-acetaminophen (NORCO/VICODIN) 5-325 MG per tablet Take 1 tablet by mouth every 4 (four) hours as needed.  12/06/12   [name], MD  ranitidine (ZANTAC) 150 MG tablet Take 150 mg by mouth 2 (two) times daily as needed for heartburn.    Historical Provider, MD   History   Social History  . Marital Status: Legally Separated    Spouse Name: N/A    Number of Children: N/A  . Years of Education: N/A   Occupational History  . Not on file.   Social History Main Topics  . Smoking status: Current Every Day Smoker -- 1.00 packs/day for 15 years    Types: Cigarettes  . Smokeless tobacco: Never Used  . Alcohol Use: No  . Drug Use: No  . Sexual Activity: Not on file   Other Topics Concern  . Not on file   Social History Narrative  . No narrative on file     Review of Systems  Constitutional: Negative for fever and chills.  HENT: Negative for congestion.   Eyes: Negative for redness.  Respiratory: Negative for cough.   Gastrointestinal: Positive for nausea, vomiting, abdominal pain and constipation.  Musculoskeletal: Positive for arthralgias.  Skin: Negative for rash.  Neurological: Positive for dizziness and light-headedness.  Psychiatric/Behavioral: Negative for confusion.    Filed Vitals:   03/30/13 1647  BP: 104/60  Pulse: 74  Temp: 98.3 F (36.8 C)  TempSrc: Oral  Resp: 16  Weight: 92 lb (41.731 kg)  SpO2: 99%    Objective:  Physical Exam  Nursing note and vitals reviewed. Constitutional: She is oriented to person, place, and time. She appears cachectic.  Very thin appearing  HENT:  Head: Normocephalic and atraumatic.  Mouth/Throat: Oropharynx is clear and moist.  Eyes: EOM are normal.  Neck: Normal range of motion.  Cardiovascular: Normal rate, regular rhythm and normal heart sounds.  Exam reveals no gallop and no friction rub.   No murmur heard. Pulmonary/Chest: Effort normal and breath sounds normal.  Abdominal: Soft. There is no tenderness.  Scaphoid appearing  Musculoskeletal: Normal range of motion. She exhibits tenderness.  Reproduction of right  shoulder pain with right rotation of right cervical spine  Neurological: She is alert and oriented to person, place, and time.  Skin: Skin is warm and dry.  Psychiatric: She has a normal mood and affect. Her behavior is normal.         Assessment & Plan:   Katherine [name] is a 43 y.o. female N&V (nausea and vomiting)  Constipation  Abdominal pain, right upper quadrant  Pain in joint, shoulder region  Weight loss, abnormal  History of Roux-en-Y gastric bypass  Heartburn  Volume depletion  Lightheadedness  History of Gastric Bypass, now with approximately 2 week history of abdominal pain, n/v, difficulty with solids and now liquids, with 14 pound weight loss in past 2 weeks. Hx of chronic anemia without known cause. volume depletion, with  lightheadedness with standing likely, and difficulty with PO. DDX achalasia/esophageal source, vs ulcer dz vs complication form prior Gastric bypass, but no apparent obstruction on CT. Discussed with Dr. Lucia Gaskins with [facility].  Advised to have her sent to ER for admission.   R shoulder pain - suspect cervical source. - can follow up after hospitalization to eval further, or further eval/imaging during hospitalization.   Patient Instructions  Go to [facility] ER for evaluation and possible admission by Dr. Lucia Gaskins. If follow up needed by primary care provider after the hospitalization, can return here.

## 2013-03-31 ENCOUNTER — Encounter (HOSPITAL_COMMUNITY): Admission: EM | Disposition: A | Discharge status: Home / Self Care | Payer: Self-pay | Source: Home / Self Care

## 2013-03-31 ENCOUNTER — Encounter (HOSPITAL_COMMUNITY): Payer: Self-pay | Admitting: *Deleted

## 2013-03-31 DIAGNOSIS — K253 Acute gastric ulcer without hemorrhage or perforation: Secondary | ICD-10-CM

## 2013-03-31 DIAGNOSIS — R109 Unspecified abdominal pain: Secondary | ICD-10-CM

## 2013-03-31 DIAGNOSIS — R1110 Vomiting, unspecified: Secondary | ICD-10-CM

## 2013-03-31 HISTORY — PX: ESOPHAGOGASTRODUODENOSCOPY: SHX5428

## 2013-03-31 LAB — TSH: TSH: 1.623 u[IU]/mL (ref 0.350–4.500)

## 2013-03-31 LAB — GLUCOSE, CAPILLARY: Glucose-Capillary: 119 mg/dL — ABNORMAL HIGH (ref 70–99)

## 2013-03-31 LAB — MRSA PCR SCREENING: MRSA by PCR: NEGATIVE

## 2013-03-31 SURGERY — EGD (ESOPHAGOGASTRODUODENOSCOPY)
Anesthesia: Moderate Sedation

## 2013-03-31 MED ORDER — FENTANYL CITRATE 0.05 MG/ML IJ SOLN
INTRAMUSCULAR | Status: AC
Start: 2013-03-31 — End: 2013-03-31
  Filled 2013-03-31: qty 4

## 2013-03-31 MED ORDER — SUCRALFATE 1 GM/10ML PO SUSP
1.0000 g | Freq: Three times a day (TID) | ORAL | Status: DC
  Administered 2013-03-31 – 2013-04-10 (×34): 1 g via ORAL
  Filled 2013-03-31 (×48): qty 10

## 2013-03-31 MED ORDER — DIPHENHYDRAMINE HCL 50 MG/ML IJ SOLN
INTRAMUSCULAR | Status: AC
  Filled 2013-03-31: qty 1

## 2013-03-31 MED ORDER — MIDAZOLAM HCL 10 MG/2ML IJ SOLN
INTRAMUSCULAR | Status: DC | PRN
  Administered 2013-03-31 (×3): 2.5 mg via INTRAVENOUS

## 2013-03-31 MED ORDER — BOOST / RESOURCE BREEZE PO LIQD
1.0000 | Freq: Three times a day (TID) | ORAL | Status: DC
  Administered 2013-03-31 – 2013-04-06 (×5): 1 via ORAL

## 2013-03-31 MED ORDER — FAT EMULSION 20 % IV EMUL
250.0000 mL | INTRAVENOUS | Status: AC
  Administered 2013-03-31: 250 mL via INTRAVENOUS
  Filled 2013-03-31: qty 250

## 2013-03-31 MED ORDER — BUTAMBEN-TETRACAINE-BENZOCAINE 2-2-14 % EX AERO
INHALATION_SPRAY | CUTANEOUS | Status: DC | PRN
Start: 2013-03-31 — End: 2013-03-31
  Administered 2013-03-31: 3 via TOPICAL

## 2013-03-31 MED ORDER — LORAZEPAM 2 MG/ML IJ SOLN
0.5000 mg | Freq: Four times a day (QID) | INTRAMUSCULAR | Status: DC | PRN
  Administered 2013-04-01 – 2013-04-03 (×5): 1 mg via INTRAVENOUS
  Administered 2013-04-03: 0.5 mg via INTRAVENOUS
  Administered 2013-04-04 – 2013-04-09 (×5): 1 mg via INTRAVENOUS
  Administered 2013-04-11 – 2013-04-14 (×3): 2 mg via INTRAVENOUS
  Filled 2013-03-31 (×15): qty 1

## 2013-03-31 MED ORDER — SODIUM CHLORIDE 0.9 % IJ SOLN
10.0000 mL | INTRAMUSCULAR | Status: DC | PRN
  Administered 2013-04-01 – 2013-04-13 (×10): 10 mL

## 2013-03-31 MED ORDER — DIPHENHYDRAMINE HCL 50 MG/ML IJ SOLN: 12.5000 mg | Freq: Four times a day (QID) | INTRAMUSCULAR | Status: DC | PRN

## 2013-03-31 MED ORDER — SODIUM CHLORIDE 0.9 % IV SOLN
Freq: Once | INTRAVENOUS | Status: AC
  Administered 2013-03-31: 500 mL via INTRAVENOUS

## 2013-03-31 MED ORDER — MIDAZOLAM HCL 10 MG/2ML IJ SOLN
INTRAMUSCULAR | Status: AC
  Filled 2013-03-31: qty 4

## 2013-03-31 MED ORDER — ACETAMINOPHEN 650 MG RE SUPP
650.0000 mg | RECTAL | Status: DC | PRN
  Filled 2013-03-31: qty 1

## 2013-03-31 MED ORDER — TRACE MINERALS CR-CU-F-FE-I-MN-MO-SE-ZN IV SOLN
INTRAVENOUS | Status: AC
  Administered 2013-03-31: 21:00:00 via INTRAVENOUS
  Filled 2013-03-31: qty 1000

## 2013-03-31 MED ORDER — FENTANYL CITRATE 0.05 MG/ML IJ SOLN
INTRAMUSCULAR | Status: DC | PRN
  Administered 2013-03-31 (×2): 25 ug via INTRAVENOUS

## 2013-03-31 MED ORDER — LORAZEPAM 2 MG/ML IJ SOLN
INTRAMUSCULAR | Status: AC
  Administered 2013-03-31: 2 mg
  Filled 2013-03-31: qty 1

## 2013-03-31 NOTE — Progress Notes (Signed)
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  Allergies  Allergen Reactions  . Nsaids     Anemia, and Gastric bypass  . Promethazine Hcl Other (See Comments)    Agitated, restless  . Tramadol Rash    Patient Measurements: Height: 5\' 4"  (162.6 cm) Weight: 95 lb 14.4 oz (43.5 kg) IBW/kg (Calculated) : 54.7   Vital Signs: Temp: 98.5 F (36.9 C) (03/23 1319) Temp src: Oral (03/23 1319) BP: 97/63 mmHg (03/23 1319) Pulse Rate: 76 (03/23 1319) Intake/Output from previous day: 03/22 0701 - 03/23 0700 In: 2398.3 [I.V.:2398.3] Out: -  Intake/Output from this shift:    Labs:  Recent Labs  03/30/13 2110  WBC 5.3  HGB 12.2  HCT 39.4  PLT 368     Recent Labs  03/30/13 2110  NA 141  K 4.1  CL 97  CO2 28  GLUCOSE 89  BUN 21  CREATININE 0.58  CALCIUM 10.0  PROT 8.6*  ALBUMIN 4.6  AST 28  ALT 12  ALKPHOS 85  BILITOT 0.4   Estimated Creatinine Clearance: 62.9 ml/min (by C-G formula based on Cr of 0.58).   No results found for this basename: GLUCAP,  in the last 72 hours  Medical History: Past Medical History  Diagnosis Date  . ADD (attention deficit disorder)   . Anemia   . Blood transfusion without reported diagnosis   . Anxiety disorder     panic  . Anxiety   . MRSA (methicillin resistant Staphylococcus aureus)   . Pneumonia     [place] as child    Medications:  Scheduled:  . amphetamine-dextroamphetamine  30 mg Oral BID  . ondansetron  4 mg Oral Q6H  . pantoprazole (PROTONIX) IV  40 mg Intravenous Q12H  . sertraline  50 mg Oral Daily  . sucralfate  1 g Oral TID WC & HS  . thiamine IV  100 mg Intravenous Daily   Infusions:  . dextrose 5 % and 0.45 % NaCl with KCl 20 mEq/L 200 mL/hr at 03/31/13 1414  . Marland KitchenTPN (CLINIMIX-E) Adult     And  . fat emulsion     PRN: HYDROcodone-acetaminophen, morphine injection, ondansetron  Insulin  Requirements in the past 24 hours:  Not ordered  Current Nutrition:  Clear liquid  Maintenance IVF: D-5-1/2 NS + KCl 74mEq/L at 200 mL/hr  Assessment: 43 y/o F s/p RYGB in 2007 presented for upper endoscopy to evaluate persistent nausea, vomiting, and abdominal pain.  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  Orders received to begin TPN with pharmacy assistance.    PICC placement is pending.    Admission electrolytes are WNL.   Anticipate high risk for refeeding syndrome in setting of malnutrition.   Nutritional Goals:  Full RD assessment is pending For now, using preliminary goals of approximately 60 grams protein/day, 1300 KCal/day   Plan:  1. For PICC placement when IV Team available. 2. After PICC in place, begin TPN:  Clinimix-E 5/15 at 25 mL/hr  Fat Emulsion 20% at 5 mL/hr 3. TPN labs starting tomorrow.  Watch K, Magnesium, Phosphorus closely 4. Check CBGs q8h starting tomorrow, but defer any insulin coverage until electrolyte results on TPN are reviewed. 5. Over the next several days, increase Clinimix to goal rate of 50 mL/hr as tolerated, and Fat Emulsion to goal rate of 10 mL/hr.  This regimen will deliver 60 grams protein/day, 1332 KCal/day. 6. Will defer to MD for changes in maintenance IVF rate given dehydration. 7. TPN labs every Monday and Thursday. 8. Will follow daily.  Clayburn Pert, PharmD, BCPS Pager: [phone] 03/31/2013  2:47 PM

## 2013-03-31 NOTE — Progress Notes (Signed)
UR completed. Patient changed to inpatient- requiring IVF @ 200cc/hr

## 2013-03-31 NOTE — Progress Notes (Signed)
Dr Johney Maine paged after patient had complaints of anxiety before receiving PICC line.  Orders received

## 2013-03-31 NOTE — Op Note (Signed)
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  (photo at end of note)  PROCEDURE:  Esophagogastroscopy  SURGEON:   Alphonsa Overall, M.D.  ANESTHESIA:   Fentanyl  75  mcg   Versed  5 mg  INDICATIONS FOR PROCEDURE:  KAHLA RISDON is a 43 y.o. (DOB: 1970-04-10)  white  female whose primary care physician is HOPPER,Yanessa Hocevar, MD and comes for upper endoscopy to evaluate nausea, vomiting, and abdominal pain.  The patient had a RYGB on Feb 2007 by Dr. Timmothy Sours in [place], [place].  Over the last 2 weeks, she has had continued vomiting, weight loss, and some abdominal pain.  She also has continued to smoke cigarettes.   The indications and risks of the endoscopy were explained to the patient.  The risks include, but are not limited to, perforation, bleeding, or injury to the bowel.  If balloon dilatation is needed, the risk of perforation is higher.  PROCEDURE:  The patient was in room 3 at [facility] endoscopy unit.  The patient was monitored with a pulse oximetry, BP cuff, and EKG.  The patient has nasal O2 flowing during the procedure.  A time out was held and the checklist run.   The back of the throat was anesthestized with Ceticaine x 3.  The patient was positioned in the left lateral decubitus position.  The patient was given Fentanyl and Versed.  A flexible Pentax endoscope was passed down the throat without difficulty.   Findings include:   Esophagus:   Normal   GE junction at:  37 cm   Stomach pouch: "Dual" pouch.  Question whether the blind pouch is dilated and makes it look like she has two pouches.   Gastrojejunal anastomosis:   42 cm.  Has eroded silastic ring in area of an ulcer.  She has a stenosis that is no more than 5 mm.  I could not see the small bowel beyond the stricture.  I could not get the scope  through the stricture..   Efferent jejunal limb:  Not visualized.      Afferent jejunal limb:  Not visulaized   CLO test:  Not done.  PLAN:   Photos taken and given to patient.   The patient is in the hospital.  Will need hydration and consider surgical options.  Discussed findings with her mother.  Alphonsa Overall, MD, [facility] Pager: [phone] Office phone:  [phone]

## 2013-03-31 NOTE — Progress Notes (Signed)
General Surgery Note  LOS: 1 day  POD -     Assessment/Plan: 1. Significant weight loss, nausea, vomiting   Possible marginal ulcer   Plan upper endo later this AM 2. 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  Dr. Timmothy Sours in [place] (was with [facility]), but she said that he has moved.   She has not seen him in at least 5 years.  3. Malnourished.   Check prealbumin - pending, give thiamine and MVI.  4. ADD - since age 43  5. Anxiety disorder  6. Hgb - 9.9 - on 03/27/2013   12.2 on admission - hemoconcentration ??? 7. Smokes cigarettes - approx 1 ppd  She knows that she needs to stop 8.  History of MRSA right face infection 2013 - treated by Dr. Guido Sander  PCR neg for MRSA here   Active Problems:   Vomiting  Subjective:  Feels better.  Less soreness.  Sister, Vicente Males, and niece, Silas Flood, in room.  Discussed upper endo later today.  Objective:   Filed Vitals:   03/31/13 0603  BP: 90/56  Pulse: 62  Temp: 98 F (36.7 C)  Resp: 16     Intake/Output from previous day:  03/22 0701 - 03/23 0700 In: 2398.3 [I.V.:2398.3] Out: -   Intake/Output this shift:      Physical Exam:   General: Very thin WF who is alert and oriented.    HEENT: Normal. Pupils equal. .   Lungs: Clear   Abdomen: Soft, scaphoid.     Lab Results:    Recent Labs  03/30/13 2110  WBC 5.3  HGB 12.2  HCT 39.4  PLT 368    BMET   Recent Labs  03/30/13 2110  NA 141  K 4.1  CL 97  CO2 28  GLUCOSE 89  BUN 21  CREATININE 0.58  CALCIUM 10.0    PT/INR  No results found for this basename: LABPROT, INR,  in the last 72 hours  ABG  No results found for this basename: PHART, PCO2, PO2, HCO3,  in the last 72 hours   Studies/Results:  No results found.   Anti-infectives:   Anti-infectives   None      Alphonsa Overall, MD, FACS Pager: [phone] Surgery Office: [phone] 03/31/2013

## 2013-03-31 NOTE — Progress Notes (Signed)
INITIAL NUTRITION ASSESSMENT  Pt meets criteria for severe MALNUTRITION in the context of chronic illness as evidenced by severe muscle wasting and subcutaneous fat loss in orbital and temporal region, clavicles, upper arms, and hands with reported 12.8% weight loss and 0% intake in the past 2 weeks.  DOCUMENTATION CODES Per approved criteria  -Severe malnutrition in the context of chronic illness -Underweight   INTERVENTION: - TPN per pharmacist - agree that pt at high refeeding risk r/t no PO intake x 2 weeks per pt report. Recommend close monitoring and repletion of potassium, magnesium, and phosphorus - Resource Breeze TID - Diet advancement per MD - Will continue to monitor   NUTRITION DIAGNOSIS: Inadequate oral intake related to clear liquid diet as evidenced by diet order.   Goal: TPN to meet >90% of estimated nutritional needs   Monitor:  Weights, labs, diet advancement, TPN, TF  Reason for Assessment: Consult for TPN, malnutrition screening tool   43 y.o. female  Admitting Dx: Significant weight loss, nausea, vomiting   ASSESSMENT: Pt presents to the Emergency Department complaining of unexpected weight loss of 14 pounds x 2 weeks. She reports losing 3 pounds in the last 3 days, and 24 pounds in the last 4 months. Pt also reports ongoing abdominal pain, nausea, hick ups (onset 1 week), dizziness, abdominal fullness, and vomiting (about 10 episodes daily). She states she is unable to keep anything down including fluids. After eating, she says the undigested food comes back up after 10-90 minutes. Pt states she had been taking Prilosec 40 mg daily, but recently switched to Maalox (1 packet daily) 3-4 days ago. Pt has been taking 4 mg of Zofran every 6 hours daily to help with vomiting. Last small BM today, prior to today pt had a BM about 2 weeks ago. She is urinating about 1-2 times a day consisting of dark yellow urine x 6 days. Hx of gastric bypass in 2007. She said that her  initial weight was 232. Her weight was stable in the 130's until her divorce a couple of years ago, then her weight went to the 110's pounds. 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 Orders received to begin TPN.  Met with pt who reports nausea controlled today by Zofran and denied any nausea. States she couldn't keep down any foods/liquids for the past 2 weeks. Reports before then she would graze on a variety of foods. States her usual weight is 107 pounds but her goal is 120 pounds. Pt requested to rest however observed pt with severe muscle wasting and subcutaneous fat loss in orbital and temporal region, clavicles, upper arms, and hands.   Per discussion with RN, surgeon may place J tube for TF later on in the week.   Height: Ht Readings from Last 1 Encounters:  03/30/13 5' 4"  (1.626 m)    Weight: Wt Readings from Last 1 Encounters:  03/31/13 95 lb 14.4 oz (43.5 kg)    Ideal Body Weight: 120 lb   % Ideal Body Weight: 79%  Wt Readings from Last 10 Encounters:  03/31/13 95 lb 14.4 oz (43.5 kg)  03/31/13 95 lb 14.4 oz (43.5 kg)  03/30/13 92 lb (41.731 kg)  03/27/13 94 lb 1 oz (42.666 kg)  12/06/12 106 lb (48.081 kg)  12/01/12 116 lb (52.617 kg)  09/17/12 110 lb (49.896 kg)  08/09/12 110 lb (49.896 kg)  08/07/12 110 lb (49.896 kg)  03/20/12 112 lb (50.803 kg)    Usual Body Weight: 107 lb per pt  % Usual Body Weight: 89%  BMI:  Body mass index is 16.45 kg/(m^2). Underweight  Estimated Nutritional Needs: Kcal: 1400-1600 Protein: 65-80g Fluid: 1.4-1.6L/day  Skin: Intact   Diet Order: Clear Liquid  EDUCATION NEEDS: -No education needs identified at this time   Intake/Output Summary (Last 24 hours) at 03/31/13 1612 Last data filed at 03/31/13 1131  Gross per 24 hour  Intake 3501.66 ml  Output      0 ml  Net 3501.66 ml    Last BM: 3/22    Labs:   Recent Labs Lab 03/27/13 1648 03/30/13 2110  NA 138 141  K 3.7 4.1  CL 97 97  CO2 29 28  BUN 20 21  CREATININE 0.60 0.58  CALCIUM 9.5 10.0  GLUCOSE 117* 89    CBG (last 3)  No results found for this basename: GLUCAP,  in the last 72 hours  Scheduled Meds: . amphetamine-dextroamphetamine  30 mg Oral BID  . ondansetron  4 mg Oral Q6H  . pantoprazole (PROTONIX) IV  40 mg Intravenous Q12H  . sertraline  50 mg Oral Daily  . sucralfate  1 g Oral TID WC & HS  . thiamine IV  100 mg Intravenous Daily    Continuous Infusions: . dextrose 5 % and 0.45 % NaCl with KCl 20 mEq/L 200 mL/hr at 03/31/13 1414  . Marland KitchenTPN (CLINIMIX-E) Adult     And  . fat emulsion      Past Medical History  Diagnosis Date  . ADD (attention deficit disorder)   . Anemia   . Blood transfusion without reported diagnosis   . Anxiety disorder     panic  . Anxiety   . MRSA (methicillin resistant Staphylococcus aureus)   . Pneumonia     [place] as child    Past Surgical History  Procedure Laterality Date  . Gastric bypass    . Cesarean section    . Breast enhancement surgery      Katherine Whitlatch MS, [place] Garcia, [place] Pager [phone] After Hours Pager

## 2013-03-31 NOTE — Progress Notes (Signed)
Peripherally Inserted Central Catheter/Midline Placement  The IV Nurse has discussed with the patient and/or persons authorized to consent for the patient, the purpose of this procedure and the potential benefits and risks involved with this procedure.  The benefits include less needle sticks, lab draws from the catheter and patient may be discharged home with the catheter.  Risks include, but not limited to, infection, bleeding, blood clot (thrombus formation), and puncture of an artery; nerve damage and irregular heat beat.  Alternatives to this procedure were also discussed.  PICC/Midline Placement Documentation  PICC / Midline Double Lumen 36/64/40 PICC Right Basilic 39 cm 2 cm (Active)       Aldona Lento L 03/31/2013, 9:05 PM

## 2013-03-31 NOTE — Progress Notes (Signed)
Spoke with patient and family in reference to bariatric needs.  Patient very upset and frustrated with diet restrictions, explained need to avoid solid foods at this point.  Offered clear liquid options to patient who accepted apple juice, and water.  Will continue to monitor.

## 2013-04-01 ENCOUNTER — Encounter (HOSPITAL_COMMUNITY): Payer: Self-pay | Admitting: Surgery

## 2013-04-01 ENCOUNTER — Inpatient Hospital Stay (HOSPITAL_COMMUNITY): Payer: PRIVATE HEALTH INSURANCE

## 2013-04-01 DIAGNOSIS — E43 Unspecified severe protein-calorie malnutrition: Secondary | ICD-10-CM | POA: Diagnosis present

## 2013-04-01 LAB — CBC WITH DIFFERENTIAL/PLATELET
BASOS ABS: 0.1 10*3/uL (ref 0.0–0.1)
Basophils Relative: 1 % (ref 0–1)
Eosinophils Absolute: 0.3 10*3/uL (ref 0.0–0.7)
Eosinophils Relative: 4 % (ref 0–5)
HCT: 31.3 % — ABNORMAL LOW (ref 36.0–46.0)
Hemoglobin: 9.8 g/dL — ABNORMAL LOW (ref 12.0–15.0)
LYMPHS PCT: 19 % (ref 12–46)
Lymphs Abs: 1.2 10*3/uL (ref 0.7–4.0)
MCH: 24.8 pg — ABNORMAL LOW (ref 26.0–34.0)
MCHC: 31.3 g/dL (ref 30.0–36.0)
MCV: 79.2 fL (ref 78.0–100.0)
Monocytes Absolute: 0.5 10*3/uL (ref 0.1–1.0)
Monocytes Relative: 8 % (ref 3–12)
NEUTROS ABS: 4.1 10*3/uL (ref 1.7–7.7)
NEUTROS PCT: 68 % (ref 43–77)
PLATELETS: 275 10*3/uL (ref 150–400)
RBC: 3.95 MIL/uL (ref 3.87–5.11)
RDW: 15.2 % (ref 11.5–15.5)
WBC: 6 10*3/uL (ref 4.0–10.5)

## 2013-04-01 LAB — COMPREHENSIVE METABOLIC PANEL
ALT: 7 U/L (ref 0–35)
AST: 16 U/L (ref 0–37)
Albumin: 3.2 g/dL — ABNORMAL LOW (ref 3.5–5.2)
Alkaline Phosphatase: 63 U/L (ref 39–117)
BILIRUBIN TOTAL: 0.3 mg/dL (ref 0.3–1.2)
BUN: 4 mg/dL — ABNORMAL LOW (ref 6–23)
CALCIUM: 8.7 mg/dL (ref 8.4–10.5)
CHLORIDE: 100 meq/L (ref 96–112)
CO2: 27 mEq/L (ref 19–32)
Creatinine, Ser: 0.52 mg/dL (ref 0.50–1.10)
GFR calc Af Amer: >90 mL/min (ref >90)
GFR calc non Af Amer: >90 mL/min (ref >90)
Glucose, Bld: 119 mg/dL — ABNORMAL HIGH (ref 70–99)
POTASSIUM: 4 meq/L (ref 3.7–5.3)
Sodium: 136 mEq/L — ABNORMAL LOW (ref 137–147)
Total Protein: 6.1 g/dL (ref 6.0–8.3)

## 2013-04-01 LAB — GLUCOSE, CAPILLARY
GLUCOSE-CAPILLARY: 126 mg/dL — AB (ref 70–99)
GLUCOSE-CAPILLARY: 97 mg/dL (ref 70–99)
Glucose-Capillary: 107 mg/dL — ABNORMAL HIGH (ref 70–99)

## 2013-04-01 LAB — PHOSPHORUS: Phosphorus: 3.8 mg/dL (ref 2.3–4.6)

## 2013-04-01 LAB — MAGNESIUM: Magnesium: 1.9 mg/dL (ref 1.5–2.5)

## 2013-04-01 LAB — TRIGLYCERIDES: TRIGLYCERIDES: 48 mg/dL (ref <150)

## 2013-04-01 MED ORDER — FAT EMULSION 20 % IV EMUL
250.0000 mL | INTRAVENOUS | Status: AC
  Administered 2013-04-01: 250 mL via INTRAVENOUS
  Filled 2013-04-01: qty 250

## 2013-04-01 MED ORDER — INSULIN ASPART 100 UNIT/ML ~~LOC~~ SOLN
0.0000 [IU] | Freq: Three times a day (TID) | SUBCUTANEOUS | Status: DC
  Administered 2013-04-06 – 2013-04-09 (×2): 1 [IU] via SUBCUTANEOUS
  Administered 2013-04-09: 2 [IU] via SUBCUTANEOUS
  Administered 2013-04-09 (×2): 1 [IU] via SUBCUTANEOUS

## 2013-04-01 MED ORDER — M.V.I. ADULT IV INJ
INTRAVENOUS | Status: AC
  Administered 2013-04-01: 17:00:00 via INTRAVENOUS
  Filled 2013-04-01: qty 1000

## 2013-04-01 MED ORDER — NICOTINE 14 MG/24HR TD PT24
14.0000 mg | MEDICATED_PATCH | Freq: Every day | TRANSDERMAL | Status: DC
  Administered 2013-04-01 – 2013-04-14 (×14): 14 mg via TRANSDERMAL
  Filled 2013-04-01 (×14): qty 1

## 2013-04-01 MED ORDER — ENOXAPARIN SODIUM 40 MG/0.4ML ~~LOC~~ SOLN
40.0000 mg | Freq: Every day | SUBCUTANEOUS | Status: DC
  Filled 2013-04-01 (×2): qty 0.4

## 2013-04-01 MED ORDER — IOHEXOL 300 MG/ML  SOLN
150.0000 mL | Freq: Once | INTRAMUSCULAR | Status: AC | PRN
  Administered 2013-04-01: 150 mL via ORAL

## 2013-04-01 NOTE — Progress Notes (Addendum)
1 Day Post-Op  Subjective: She doesn't feel good, NPO for UGI ?  Not sure what is going on there.  She is hungry and tired. TNA started yesterday evening.  Objective: Vital signs in last 24 hours: Temp:  [98.1 F (36.7 C)-99 F (37.2 C)] 98.1 F (36.7 C) (03/24 0606) Pulse Rate:  [60-92] 60 (03/24 0606) Resp:  [12-36] 16 (03/24 0606) BP: (77-135)/(32-88) 98/65 mmHg (03/24 0606) SpO2:  [97 %-100 %] 100 % (03/24 0606) Last BM Date: 03/30/13 10 ml PO, I/o record 3.8 liter positive with 1.8 KG weight increase since admit, recorded. TNA started last PM Diet: clears Afebrile, VSS, but low Labs OK, albumin is low, I do not see the prealbumin, but it is ordered. Intake/Output from previous day: 03/23 0701 - 03/24 0700 In: 5075.9 [P.O.:10; I.V.:4800; TPN:265.9] Out: 1250 [Urine:1250] Intake/Output this shift:    General appearance: alert, cooperative and no distress GI: soft somewhat tender, few BS. No distension.  Lab Results:   Recent Labs  03/30/13 2110 04/01/13 0530  WBC 5.3 6.0  HGB 12.2 9.8*  HCT 39.4 31.3*  PLT 368 275    BMET  Recent Labs  03/30/13 2110 04/01/13 0530  NA 141 136*  K 4.1 4.0  CL 97 100  CO2 28 27  GLUCOSE 89 119*  BUN 21 4*  CREATININE 0.58 0.52  CALCIUM 10.0 8.7   PT/INR No results found for this basename: LABPROT, INR,  in the last 72 hours   Recent Labs Lab 03/27/13 1648 03/30/13 2110 04/01/13 0530  AST 21 28 16   ALT 12 12 7   ALKPHOS 69 85 63  BILITOT 0.3 0.4 0.3  PROT 7.0 8.6* 6.1  ALBUMIN 3.7 4.6 3.2*     Lipase     Component Value Date/Time   LIPASE 49 03/27/2013 1648     Studies/Results: No results found.  Medications: . amphetamine-dextroamphetamine  30 mg Oral BID  . feeding supplement (RESOURCE BREEZE)  1 Container Oral TID BM  . insulin aspart  0-9 Units Subcutaneous Q8H  . ondansetron  4 mg Oral Q6H  . pantoprazole (PROTONIX) IV  40 mg Intravenous Q12H  . sertraline  50 mg Oral Daily  . sucralfate  1 g  Oral TID WC & HS  . thiamine IV  100 mg Intravenous Daily   . dextrose 5 % and 0.45 % NaCl with KCl 20 mEq/L 200 mL/hr at 04/01/13 0749  . Marland KitchenTPN (CLINIMIX-E) Adult 25 mL/hr at 03/31/13 2108   And  . fat emulsion 250 mL (03/31/13 2109)  . Marland KitchenTPN (CLINIMIX-E) Adult     And  . fat emulsion     Prior to Admission medications   Medication Sig Start Date End Date Taking? Authorizing Provider  amphetamine-dextroamphetamine (ADDERALL) 30 MG tablet Take 1 tablet (30 mg total) by mouth 2 (two) times daily. 12/06/12  Yes [name], MD  ondansetron (ZOFRAN) 4 MG tablet Take 1 tablet (4 mg total) by mouth every 6 (six) hours. 03/27/13  Yes Shari A Upstill, PA-C  sertraline (ZOLOFT) 50 MG tablet Take 1 tablet (50 mg total) by mouth daily. 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 Timmothy Sours, [place], [place] Malnourished ADD/anxiety disorder Tobacco use anemia   Plan:  She was started on TNA last evening, she is awaiting UGI.  She would like something to eat.  I will add a nicotine  patch.  Lovenox for DVT/SCD's also. i also talked with pharmacy and we will cut her IV fluids down to 100 ml/hr.    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  It is unlikely she will tolerate going home. Plan for now is TPN x 7 days, plan revision of GJ next Tuesday. Must avoid smoking. Prealbumin still not back Will also discuss with bariatric partners.  I discussed tentative plan with patient, Mom, Sister - Vicente Males, brother-in-law - Rush Landmark, and Niece - [place].  She needs to be NPO except small amounts of ice or flavored ice. I showed them her x-rays from today. I discussed the risks of surgery, including bleeding, infection, leak from the bowel, and failure of her stomach (remnant) to function properly. Plan is revise [place] next Tuesday,  3/31.  Alphonsa Overall, MD, [facility] Pager: [phone] Office phone:  [phone]

## 2013-04-01 NOTE — Progress Notes (Signed)
Patient is alert and oriented, patient agitated today, family at bedside, patient tolerating clear liquid diet fairly no vomiting, surgeon Lucia Gaskins rounded and informed patient and patient's family on the plan of care, patient medicated for anxiety and agitation with ativan and effective and also medicated for pain throughout the shift, will continue to monitor Katherine Mends RN 04-01-2013 17:39pm

## 2013-04-01 NOTE — Progress Notes (Signed)
[facility] NOTE  Pharmacy Consult for TPN Indication: Stenosis of GJ anastomosis with marginal ulcer in patient s/p RYGB (2007).  Allergies  Allergen Reactions  . Nsaids     Anemia, and Gastric bypass  . Promethazine Hcl Other (See Comments)    Agitated, restless  . Tramadol Rash    Patient Measurements: Height: 5\' 4"  (162.6 cm) Weight: 95 lb 14.4 oz (43.5 kg) IBW/kg (Calculated) : 54.7   Vital Signs: Temp: 98.1 F (36.7 C) (03/24 0606) Temp src: Oral (03/24 0606) BP: 98/65 mmHg (03/24 0606) Pulse Rate: 60 (03/24 0606) Intake/Output from previous day: 03/23 0701 - 03/24 0700 In: 5075.9 [P.O.:10; I.V.:4800; TPN:265.9] Out: 1250 [Urine:1250] Intake/Output from this shift:    Labs:  Recent Labs  03/30/13 2110 04/01/13 0530  WBC 5.3 6.0  HGB 12.2 9.8*  HCT 39.4 31.3*  PLT 368 275     Recent Labs  03/30/13 2110 04/01/13 0530  NA 141 136*  K 4.1 4.0  CL 97 100  CO2 28 27  GLUCOSE 89 119*  BUN 21 4*  CREATININE 0.58 0.52  CALCIUM 10.0 8.7  MG  --  1.9  PHOS  --  3.8  PROT 8.6* 6.1  ALBUMIN 4.6 3.2*  AST 28 16  ALT 12 7  ALKPHOS 85 63  BILITOT 0.4 0.3  TRIG  --  48  Corrected Ca=9.3 on 3/24 Estimated Creatinine Clearance: 62.9 ml/min (by C-G formula based on Cr of 0.52).    Recent Labs  03/31/13 2357 04/01/13 0715  GLUCAP 119* 126*    Medical History: Past Medical History  Diagnosis Date  . ADD (attention deficit disorder)   . Anemia   . Blood transfusion without reported diagnosis   . Anxiety disorder     panic  . Anxiety   . MRSA (methicillin resistant Staphylococcus aureus)   . Pneumonia     [place] as child    Medications:  Scheduled:  . amphetamine-dextroamphetamine  30 mg Oral BID  . feeding supplement (RESOURCE BREEZE)  1 Container Oral TID BM  . insulin aspart  0-9 Units Subcutaneous Q8H  . ondansetron  4 mg Oral Q6H  . pantoprazole (PROTONIX) IV  40 mg Intravenous Q12H  . sertraline  50 mg Oral  Daily  . sucralfate  1 g Oral TID WC & HS  . thiamine IV  100 mg Intravenous Daily   Infusions:  . dextrose 5 % and 0.45 % NaCl with KCl 20 mEq/L 200 mL/hr at 04/01/13 0749  . Marland KitchenTPN (CLINIMIX-E) Adult 25 mL/hr at 03/31/13 2108   And  . fat emulsion 250 mL (03/31/13 2109)   PRN: acetaminophen, diphenhydrAMINE, HYDROcodone-acetaminophen, LORazepam, morphine injection, ondansetron, sodium chloride  Insulin Requirements in the past 24 hours:  Not ordered at present  Current Nutrition:  Clear liquid Clinimix-E 5/15 at 25 mL/hr + Fat Emulsion 20% at 5 mL/hr  Maintenance IVF: D-5-1/2 NS + KCl 40mEq/L at 200 mL/hr  Assessment: 43 y/o F s/p RYGB in 2007 presented for upper endoscopy to evaluate persistent nausea, vomiting, and abdominal pain.  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  Orders received to begin TPN with pharmacy assistance.    Nutritional Goals:  Per RD assessment 3/23: Kcal: 1400-1600 /day  Protein: 65-80g / day  3/24:  TPN was started last night at 2100 Glucose:   CBGs < 150 Electrolytes: WNL except Na slightly low. Renal:  SCr WNL, stable.  BUN low. Hepatic:  LFTs below ULN Triglycerides:  WNL Prealbumin: pending   D#1 TPN (3/23 - present) Access:  PICC  Plan:  1. Add sliding-scale Novolog, sensitive scale q8h starting this afternoon. 2. When next TPN bag begins tonight at 1800:  - Increase Clinimix to 35 mL/hr  - Increase Fat Emulsion to 10 mL/hr  - Multivitamins daily  - Trace Elements on MWF only (due to national shortage) 3. BMet, Mag, Phos tomorrow.  Watch for possible evidence of refeeding syndrome. 4. Over next several days, advance Clinimix rate as tolerated to new goal rate of 60 mL/hr, to provide 72 grams protein/day, 1500 KCal/day 5. Given patient's baseline dehydration will defer to MD on maintenance IVF rate adjustments as TPN advances. 6. F/U on  baseline prealbumin. 7. Routine TPN labs on Mondays and Thursdays.   Clayburn Pert, PharmD, BCPS Pager: [phone] 04/01/2013  7:54 AM

## 2013-04-02 DIAGNOSIS — E46 Unspecified protein-calorie malnutrition: Secondary | ICD-10-CM

## 2013-04-02 LAB — BASIC METABOLIC PANEL WITH GFR
BUN: 8 mg/dL (ref 6–23)
CO2: 26 meq/L (ref 19–32)
Calcium: 9 mg/dL (ref 8.4–10.5)
Chloride: 98 meq/L (ref 96–112)
Creatinine, Ser: 0.55 mg/dL (ref 0.50–1.10)
GFR calc Af Amer: >90 mL/min (ref >90)
GFR calc non Af Amer: >90 mL/min (ref >90)
Glucose, Bld: 96 mg/dL (ref 70–99)
Potassium: 4.1 meq/L (ref 3.7–5.3)
Sodium: 136 meq/L — ABNORMAL LOW (ref 137–147)

## 2013-04-02 LAB — MAGNESIUM: MAGNESIUM: 1.9 mg/dL (ref 1.5–2.5)

## 2013-04-02 LAB — GLUCOSE, CAPILLARY
Glucose-Capillary: 109 mg/dL — ABNORMAL HIGH (ref 70–99)
Glucose-Capillary: 84 mg/dL (ref 70–99)
Glucose-Capillary: 94 mg/dL (ref 70–99)
Glucose-Capillary: 97 mg/dL (ref 70–99)
Glucose-Capillary: 98 mg/dL (ref 70–99)

## 2013-04-02 LAB — PREALBUMIN: Prealbumin: 16.3 mg/dL — ABNORMAL LOW (ref 17.0–34.0)

## 2013-04-02 LAB — PHOSPHORUS: Phosphorus: 4.7 mg/dL — ABNORMAL HIGH (ref 2.3–4.6)

## 2013-04-02 MED ORDER — FAT EMULSION 20 % IV EMUL
250.0000 mL | INTRAVENOUS | Status: AC
  Administered 2013-04-02: 250 mL via INTRAVENOUS
  Filled 2013-04-02: qty 250

## 2013-04-02 MED ORDER — HYDROMORPHONE HCL PF 1 MG/ML IJ SOLN
0.5000 mg | INTRAMUSCULAR | Status: DC | PRN
  Administered 2013-04-02: 0.5 mg via INTRAVENOUS
  Administered 2013-04-03 (×5): 1 mg via INTRAVENOUS
  Administered 2013-04-03: 0.5 mg via INTRAVENOUS
  Administered 2013-04-03 (×3): 1 mg via INTRAVENOUS
  Administered 2013-04-03: 0.5 mg via INTRAVENOUS
  Administered 2013-04-03 – 2013-04-08 (×48): 1 mg via INTRAVENOUS
  Filled 2013-04-02 (×61): qty 1

## 2013-04-02 MED ORDER — TRACE MINERALS CR-CU-F-FE-I-MN-MO-SE-ZN IV SOLN
INTRAVENOUS | Status: AC
  Administered 2013-04-02: 17:00:00 via INTRAVENOUS
  Filled 2013-04-02: qty 2000

## 2013-04-02 MED ORDER — ENOXAPARIN SODIUM 30 MG/0.3ML ~~LOC~~ SOLN
30.0000 mg | SUBCUTANEOUS | Status: DC
  Administered 2013-04-02 – 2013-04-07 (×5): 30 mg via SUBCUTANEOUS
  Filled 2013-04-02 (×7): qty 0.3

## 2013-04-02 NOTE — Progress Notes (Signed)
Patient alert and oriented,  Provided support and encouragement.  All questions answered.  Will continue to monitor.

## 2013-04-02 NOTE — Progress Notes (Signed)
2 Days Post-Op  Subjective: Pt continues to c/o abdominal pain, right shoulder pain, and headaches.  Shes frustrated about having to wait until Tuesday.  Ambulating out of bed.  Feels weak.  No more N/V.  Tolerating popsicle and ice chips.    Objective: Vital signs in last 24 hours: Temp:  [97.3 F (36.3 C)-98.1 F (36.7 C)] 98.1 F (36.7 C) (03/25 0547) Pulse Rate:  [78-88] 88 (03/25 0702) Resp:  [16-18] 16 (03/25 0547) BP: (88-98)/(44-66) 98/63 mmHg (03/25 0702) SpO2:  [95 %-100 %] 95 % (03/25 0547) Weight:  [93 lb 8 oz (42.411 kg)] 93 lb 8 oz (42.411 kg) (03/25 1123) Last BM Date: 03/30/13  Intake/Output from previous day: 03/24 0701 - 03/25 0700 In: 3503.3 [P.O.:120; I.V.:3023.3; TPN:360] Out: 900 [Urine:900] Intake/Output this shift: Total I/O In: 400 [I.V.:400] Out: 550 [Urine:550]  PE: Gen:  Alert, NAD, pleasant Abd: Soft, tender in upper abdomen, mildly distended, +BS, no HSM, abdominal scars noted   Lab Results:   Recent Labs  03/30/13 2110 04/01/13 0530  WBC 5.3 6.0  HGB 12.2 9.8*  HCT 39.4 31.3*  PLT 368 275   BMET  Recent Labs  04/01/13 0530 04/02/13 0545  NA 136* 136*  K 4.0 4.1  CL 100 98  CO2 27 26  GLUCOSE 119* 96  BUN 4* 8  CREATININE 0.52 0.55  CALCIUM 8.7 9.0   PT/INR No results found for this basename: LABPROT, INR,  in the last 72 hours CMP     Component Value Date/Time   NA 136* 04/02/2013 0545   K 4.1 04/02/2013 0545   CL 98 04/02/2013 0545   CO2 26 04/02/2013 0545   GLUCOSE 96 04/02/2013 0545   BUN 8 04/02/2013 0545   CREATININE 0.55 04/02/2013 0545   CALCIUM 9.0 04/02/2013 0545   PROT 6.1 04/01/2013 0530   ALBUMIN 3.2* 04/01/2013 0530   AST 16 04/01/2013 0530   ALT 7 04/01/2013 0530   ALKPHOS 63 04/01/2013 0530   BILITOT 0.3 04/01/2013 0530   GFRNONAA >90 04/02/2013 0545   GFRAA >90 04/02/2013 0545   Lipase     Component Value Date/Time   LIPASE 49 03/27/2013 1648       Studies/Results: Dg Ugi W/kub  04/01/2013    CLINICAL DATA:  Gastric outlet obstruction.  Prior gastrojejunal.  EXAM: UPPER GI SERIES W/ KUB  TECHNIQUE: After obtaining a scout radiograph a routine upper GI series was performed using Gastrografin.  FLUOROSCOPY TIME:  dictate in minutes & seconds  COMPARISON:  1 min 35 seconds.  FINDINGS: Visualize esophagus unremarkable. The esophagus empties into the stomach rapidly. The patient vomited during the procedure. Spot films and delayed images up to 45 min obtained and reveals gastrojejunostomy high-grade obstruction with near complete lack of contrast filling in the jejunal loop. No leak identified.  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 Delayed images revealed almost complete lack of emptying from the stomach.   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 Timmothy Sours, [place], [place]  Malnourished  Nausea/vomiting ADD/anxiety disorder  Tobacco use  Anemia - likely due to gastric bypass  Plan:  1.  Continue TNA, NPO except ice chips and popsicles.   2.  Dr. Lucia Gaskins did UGI yesterday shows large gastric pouch  with essentially no emptying from pouch. It is unlikely she will tolerate going home.  3.  Cont nicotine patch.  Avoid smoking.  Lovenox for DVT/SCD's also.  4.  Ambulate and IS 5.  Plan is revise [place] next Tuesday, 3/31 with Dr. Lucia Gaskins    LOS: 3 days    [place], [facility] 04/02/2013, 12:04 PM Pager: [phone]

## 2013-04-02 NOTE — Progress Notes (Signed)
[facility] NOTE  Pharmacy Consult for TPN Indication: Stenosis of GJ anastomosis with marginal ulcer in patient s/p RYGB (2007).  Allergies  Allergen Reactions  . Nsaids     Anemia, and Gastric bypass  . Promethazine Hcl Other (See Comments)    Agitated, restless  . Tramadol Rash    Patient Measurements: Height: 5\' 4"  (162.6 cm) Weight: 95 lb (43.092 kg) IBW/kg (Calculated) : 54.7   Vital Signs: Temp: 98.1 F (36.7 C) (03/25 0547) Temp src: Oral (03/25 0547) BP: 98/63 mmHg (03/25 0702) Pulse Rate: 88 (03/25 0702) Intake/Output from previous day: 03/24 0701 - 03/25 0700 In: 3503.3 [P.O.:120; I.V.:3023.3; TPN:360] Out: 900 [Urine:900] Intake/Output from this shift:    Labs:  Recent Labs  03/30/13 2110 04/01/13 0530  WBC 5.3 6.0  HGB 12.2 9.8*  HCT 39.4 31.3*  PLT 368 275     Recent Labs  03/30/13 2110 03/31/13 0440 04/01/13 0530 04/02/13 0545  NA 141  --  136* 136*  K 4.1  --  4.0 4.1  CL 97  --  100 98  CO2 28  --  27 26  GLUCOSE 89  --  119* 96  BUN 21  --  4* 8  CREATININE 0.58  --  0.52 0.55  CALCIUM 10.0  --  8.7 9.0  MG  --   --  1.9 1.9  PHOS  --   --  3.8 4.7*  PROT 8.6*  --  6.1  --   ALBUMIN 4.6  --  3.2*  --   AST 28  --  16  --   ALT 12  --  7  --   ALKPHOS 85  --  63  --   BILITOT 0.4  --  0.3  --   PREALBUMIN  --  16.3*  --   --   TRIG  --   --  48  --   Corrected Ca=9.3 on 3/24 Estimated Creatinine Clearance: 62.3 ml/min (by C-G formula based on Cr of 0.55).    Recent Labs  04/01/13 2006 04/02/13 0126 04/02/13 0730  GLUCAP 107* 97 72    Medical History: Past Medical History  Diagnosis Date  . ADD (attention deficit disorder)   . Anemia   . Blood transfusion without reported diagnosis   . Anxiety disorder     panic  . Anxiety   . MRSA (methicillin resistant Staphylococcus aureus)   . Pneumonia     [place] as child    Medications:  Scheduled:  . amphetamine-dextroamphetamine  30 mg Oral BID   . enoxaparin (LOVENOX) injection  30 mg Subcutaneous Q24H  . feeding supplement (RESOURCE BREEZE)  1 Container Oral TID BM  . insulin aspart  0-9 Units Subcutaneous Q8H  . nicotine  14 mg Transdermal Daily  . ondansetron  4 mg Oral Q6H  . pantoprazole (PROTONIX) IV  40 mg Intravenous Q12H  . sertraline  50 mg Oral Daily  . sucralfate  1 g Oral TID WC & HS  . thiamine IV  100 mg Intravenous Daily   Infusions:  . dextrose 5 % and 0.45 % NaCl with KCl 20 mEq/L 100 mL/hr at 04/01/13 1214  . Marland KitchenTPN (CLINIMIX-E) Adult 35 mL/hr at 04/01/13 1725   And  . fat emulsion 250 mL (04/01/13 1725)   PRN: acetaminophen, diphenhydrAMINE, HYDROcodone-acetaminophen, LORazepam, morphine injection, ondansetron, sodium chloride  Insulin Requirements in the past 24 hours:  Sensitive SSI q8h started 3/24 0 units SSI required  Current Nutrition:  NPO expect for ice chips  Clinimix-E 5/15 at 35 mL/hr + Fat Emulsion 20% at 5 mL/hr  Maintenance IVF: D-5-1/2 NS + KCl 18mEq/L at 100 mL/hr  Assessment: 43 y/o F s/p RYGB in 2007 presented for upper endoscopy to evaluate persistent nausea, vomiting, and abdominal pain.  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  Orders received to begin TPN with pharmacy assistance.  Plan is for TPN x 7 days and then revision of GJ next Tuesday   Nutritional Goals:  Per RD assessment 3/23: Kcal: 1400-1600 /day  Protein: 65-80g / day  Goal Rate: Clinimix-E 5/15 at 60 mL/hr + IVFE 20% at 10 mL/hr to deliver 72 grams protein/d, 1502 KCal/d   3/24:  TPN was started 3/23 Glucose:   CBGs < 150 Electrolytes: WNL except Na slightly low Renal:  SCr WNL, stable.  BUN low Hepatic:  LFTs below ULN Triglycerides:  WNL Prealbumin: 16.3 (3/23)   D#2 TPN (3/23 - present) Access:  PICC  Plan:  1. Increase Clinimix E5/15 to 45 ml/hr at 1800.  Continue fat emulsion 20% at 10 ml/hr 2.  Multivitamin Daily, Trace Elements on MWF only (due to national shortage) 4. BMet, Mag, Phos tomorrow.  Watch for possible evidence of refeeding syndrome. 4. Advance TNA slowly over next few days to goal rate of 60 ml/hr 5. Given patient's baseline dehydration will defer to MD on maintenance IVF rate adjustments as TPN advances. 6. Routine TPN labs on Mondays and Thursdays.   BorgerdingGaye Alken PharmD Pager #: [phone] 10:31 AM 04/02/2013

## 2013-04-03 ENCOUNTER — Encounter (HOSPITAL_COMMUNITY): Payer: Self-pay | Admitting: Dentistry

## 2013-04-03 ENCOUNTER — Inpatient Hospital Stay (HOSPITAL_COMMUNITY): Payer: PRIVATE HEALTH INSURANCE

## 2013-04-03 DIAGNOSIS — R221 Localized swelling, mass and lump, neck: Secondary | ICD-10-CM

## 2013-04-03 DIAGNOSIS — R220 Localized swelling, mass and lump, head: Secondary | ICD-10-CM

## 2013-04-03 DIAGNOSIS — M272 Inflammatory conditions of jaws: Secondary | ICD-10-CM

## 2013-04-03 DIAGNOSIS — R112 Nausea with vomiting, unspecified: Secondary | ICD-10-CM

## 2013-04-03 DIAGNOSIS — K311 Adult hypertrophic pyloric stenosis: Secondary | ICD-10-CM

## 2013-04-03 LAB — COMPREHENSIVE METABOLIC PANEL
ALT: 7 U/L (ref 0–35)
AST: 18 U/L (ref 0–37)
Albumin: 3.6 g/dL (ref 3.5–5.2)
Alkaline Phosphatase: 79 U/L (ref 39–117)
BILIRUBIN TOTAL: 0.3 mg/dL (ref 0.3–1.2)
BUN: 12 mg/dL (ref 6–23)
CHLORIDE: 98 meq/L (ref 96–112)
CO2: 25 mEq/L (ref 19–32)
CREATININE: 0.67 mg/dL (ref 0.50–1.10)
Calcium: 9.1 mg/dL (ref 8.4–10.5)
GFR calc Af Amer: >90 mL/min (ref >90)
GFR calc non Af Amer: >90 mL/min (ref >90)
Glucose, Bld: 112 mg/dL — ABNORMAL HIGH (ref 70–99)
Potassium: 3.9 mEq/L (ref 3.7–5.3)
Sodium: 134 mEq/L — ABNORMAL LOW (ref 137–147)
Total Protein: 7.1 g/dL (ref 6.0–8.3)

## 2013-04-03 LAB — GLUCOSE, CAPILLARY
GLUCOSE-CAPILLARY: 102 mg/dL — AB (ref 70–99)
GLUCOSE-CAPILLARY: 86 mg/dL (ref 70–99)
Glucose-Capillary: 111 mg/dL — ABNORMAL HIGH (ref 70–99)

## 2013-04-03 LAB — MAGNESIUM: Magnesium: 1.9 mg/dL (ref 1.5–2.5)

## 2013-04-03 LAB — PHOSPHORUS: PHOSPHORUS: 5.2 mg/dL — AB (ref 2.3–4.6)

## 2013-04-03 MED ORDER — FAT EMULSION 20 % IV EMUL
250.0000 mL | INTRAVENOUS | Status: AC
  Administered 2013-04-03: 250 mL via INTRAVENOUS
  Filled 2013-04-03: qty 250

## 2013-04-03 MED ORDER — AMOXICILLIN-POT CLAVULANATE 875-125 MG PO TABS
1.0000 | ORAL_TABLET | Freq: Two times a day (BID) | ORAL | Status: DC
  Administered 2013-04-03: 1 via ORAL
  Filled 2013-04-03 (×2): qty 1

## 2013-04-03 MED ORDER — M.V.I. ADULT IV INJ
INTRAVENOUS | Status: AC
  Administered 2013-04-03: 18:00:00 via INTRAVENOUS
  Filled 2013-04-03: qty 2000

## 2013-04-03 MED ORDER — SODIUM CHLORIDE 0.9 % IV SOLN
3.0000 g | Freq: Four times a day (QID) | INTRAVENOUS | Status: DC
  Administered 2013-04-03 – 2013-04-11 (×30): 3 g via INTRAVENOUS
  Filled 2013-04-03 (×33): qty 3

## 2013-04-03 NOTE — Progress Notes (Signed)
ANTIBIOTIC CONSULT NOTE - INITIAL  Pharmacy Consult for  Unasyn  Indication: Dental abscess  Allergies  Allergen Reactions  . Nsaids     Anemia, and Gastric bypass  . Promethazine Hcl Other (See Comments)    Agitated, restless  . Tramadol Rash    Patient Measurements: Height: 5\' 4"  (162.6 cm) Weight: 93 lb 8 oz (42.411 kg) IBW/kg (Calculated) : 54.7   Vital Signs: Temp: 97.8 F (36.6 C) (03/26 1329) Temp src: Oral (03/26 1329) BP: 99/63 mmHg (03/26 1329) Pulse Rate: 92 (03/26 1329) Intake/Output from previous day: 03/25 0701 - 03/26 0700 In: 3060 [I.V.:2400; TPN:660] Out: 1550 [Urine:1550] Intake/Output from this shift: Total I/O In: 580 [I.V.:400; TPN:180] Out: -   Labs:  Recent Labs  04/01/13 0530 04/02/13 0545 04/03/13 0325  WBC 6.0  --   --   HGB 9.8*  --   --   PLT 275  --   --   CREATININE 0.52 0.55 0.67   Estimated Creatinine Clearance: 61.3 ml/min (by C-G formula based on Cr of 0.67). No results found for this basename: VANCOTROUGH, Corlis Leak, VANCORANDOM, [place], GENTPEAK, GENTRANDOM, TOBRATROUGH, TOBRAPEAK, TOBRARND, AMIKACINPEAK, AMIKACINTROU, AMIKACIN,  in the last 72 hours   Microbiology: Recent Results (from the past 720 hour(s))  MRSA PCR SCREENING     Status: None   Collection Time    03/30/13  9:55 PM      Result Value Ref Range Status   MRSA by PCR NEGATIVE  NEGATIVE Final   Comment:            The GeneXpert MRSA Assay (FDA     approved for NASAL specimens     only), is one component of a     comprehensive MRSA colonization     surveillance program. It is not     intended to diagnose MRSA     infection nor to guide or     monitor treatment for     MRSA infections.    Medical History: Past Medical History  Diagnosis Date  . ADD (attention deficit disorder)   . Anemia   . Blood transfusion without reported diagnosis   . Anxiety disorder     panic  . Anxiety   . MRSA (methicillin resistant Staphylococcus aureus)   .  Pneumonia     [place] as child    Medications:  Scheduled:  . amphetamine-dextroamphetamine  30 mg Oral BID  . ampicillin-sulbactam (UNASYN) IV  3 g Intravenous Q6H  . enoxaparin (LOVENOX) injection  30 mg Subcutaneous Q24H  . feeding supplement (RESOURCE BREEZE)  1 Container Oral TID BM  . insulin aspart  0-9 Units Subcutaneous Q8H  . nicotine  14 mg Transdermal Daily  . ondansetron  4 mg Oral Q6H  . pantoprazole (PROTONIX) IV  40 mg Intravenous Q12H  . sertraline  50 mg Oral Daily  . sucralfate  1 g Oral TID WC & HS  . thiamine IV  100 mg Intravenous Daily   Infusions:  . dextrose 5 % and 0.45 % NaCl with KCl 20 mEq/L 100 mL/hr at 04/03/13 1008  . Marland KitchenTPN (CLINIMIX-E) Adult 45 mL/hr at 04/02/13 1723   And  . fat emulsion 250 mL (04/02/13 1724)  . fat emulsion    . TPN (CLINIMIX) Adult without lytes     PRN: acetaminophen, diphenhydrAMINE, HYDROcodone-acetaminophen, HYDROmorphone (DILAUDID) injection, LORazepam, ondansetron, sodium chloride Assessment: 43 yo F with fractured tooth with an associated abscess, She was started on augmentin but was unable to  tolerate.  Pharmacy asked to dose Unasyn.    Patients SCr is WNL, CrCl 61 ml/min.    WBC WNL  AF  No Micro data available  Goal of Therapy:  Unasyn per renal function   Plan:  1.) Unasyn 3 grams IV q6h 2.) Continue to monitor renal function  3.) f/u plan from dentist  Glee Arvin PharmD Pager #: [phone] 1:43 PM 04/03/2013

## 2013-04-03 NOTE — Consult Note (Signed)
DENTAL CONSULTATION  Date of Consultation:  04/03/2013 Patient Name:   Katherine [name] Date of Birth:   [birth year] Medical Record Number: [id number]  VITALS: BP 99/63  Pulse 92  Temp(Src) 97.8 F (36.6 C) (Oral)  Resp 16  Ht 5\' 4"  (1.626 m)  Wt 93 lb 8 oz (42.411 kg)  BMI 16.04 kg/m2  SpO2 100%  LMP 03/17/2013   CHIEF COMPLAINT: Patient was referred for evaluation of left facial swelling. HPI: Katherine [name] is a 43 year old female that was admitted with a history of nausea and vomiting. Patient has a pending surgery with Dr. Lucia Gaskins for her GJ stenosis with marginal erosions that is scheduled for 04/08/2013. Patient subsequently developed left facial swelling since last night. Dental consultation was then requested to evaluate left facial swelling and rule out dental etiology.  Patient currently denies any acute dental pain associated with the lower left molar. Patient indicates that she developed left submandibualr lymph node swelling since last night. Patient is now on Unasyn IV antibiotic therapy. Patient knows that she needs root canal therapy on tooth #18 and that she was planning to take care of it in late April 2015 although no appointment is scheduled.  The patient last saw her primary dentist, Dr. Idamae Schuller,  in [place], [place] approximately 2 months ago. Patient had tooth #20 removed at that time with no complications. Patient has dental insurance that will not go into the defect until July 2015 by her report.   PROBLEM LIST: Patient Active Problem List   Diagnosis Date Noted  . Protein-calorie malnutrition, severe 04/01/2013  . Vomiting 03/30/2013  . Facial cellulitis 03/21/2012  . Anxiety disorder     PMH: Past Medical History  Diagnosis Date  . ADD (attention deficit disorder)   . Anemia   . Blood transfusion without reported diagnosis   . Anxiety disorder     panic  . Anxiety   . MRSA (methicillin resistant Staphylococcus aureus)   .  Pneumonia     [place] as child    PSH: Past Surgical History  Procedure Laterality Date  . Gastric bypass    . Cesarean section    . Breast enhancement surgery    . Esophagogastroduodenoscopy N/A 03/31/2013    Procedure: ESOPHAGOGASTRODUODENOSCOPY (EGD);  Surgeon: Shann Medal, MD;  Location: [facility];  Service: General;  Laterality: N/A;    ALLERGIES: Allergies  Allergen Reactions  . Nsaids     Anemia, and Gastric bypass  . Promethazine Hcl Other (See Comments)    Agitated, restless  . Tramadol Rash    MEDICATIONS: Current Facility-Administered Medications  Medication Dose Route Frequency Provider Last Rate Last Dose  . acetaminophen (TYLENOL) suppository 650 mg  650 mg Rectal Q4H PRN Adin Hector, MD      . amphetamine-dextroamphetamine (ADDERALL) tablet 30 mg  30 mg Oral BID Shann Medal, MD   30 mg at 04/03/13 0951  . Ampicillin-Sulbactam (UNASYN) 3 g in sodium chloride 0.9 % 100 mL IVPB  3 g Intravenous Q6H Gaye Alken [place], RPH   3 g at 04/03/13 1452  . dextrose 5 % and 0.45 % NaCl with KCl 20 mEq/L infusion   Intravenous Continuous Earnstine Regal, PA-C 100 mL/hr at 04/03/13 1008    . diphenhydrAMINE (BENADRYL) injection 12.5-25 mg  12.5-25 mg Intravenous Q6H PRN Adin Hector, MD      . enoxaparin (LOVENOX) injection 30 mg  30 mg Subcutaneous Q24H Randall K Absher, RPH   30 mg  at 04/03/13 1211  . TPN (CLINIMIX-E) Adult   Intravenous Continuous TPN Gaye Alken [place], RPH 45 mL/hr at 04/02/13 1723     And  . fat emulsion 20 % infusion 250 mL  250 mL Intravenous Continuous TPN Gaye Alken [place], RPH 10 mL/hr at 04/02/13 1724 250 mL at 04/02/13 1724  . fat emulsion 20 % infusion 250 mL  250 mL Intravenous Continuous TPN Gaye Alken [place], RPH      . feeding supplement (RESOURCE BREEZE) (RESOURCE BREEZE) liquid 1 Container  1 Container Oral TID BM Christie Beckers, RD   1 Container at 04/03/13 1440  . HYDROcodone-acetaminophen  (NORCO/VICODIN) 5-325 MG per tablet 1-2 tablet  1-2 tablet Oral Q4H PRN Shann Medal, MD      . HYDROmorphone (DILAUDID) injection 0.5-1 mg  0.5-1 mg Intravenous Q1H PRN Pedro Earls, MD   1 mg at 04/03/13 1520  . insulin aspart (novoLOG) injection 0-9 Units  0-9 Units Subcutaneous Q8H Randall K Absher, RPH      . LORazepam (ATIVAN) injection 0.5-2 mg  0.5-2 mg Intravenous Q6H PRN Adin Hector, MD   1 mg at 04/03/13 1340  . nicotine (NICODERM CQ - dosed in mg/24 hours) patch 14 mg  14 mg Transdermal Daily Earnstine Regal, PA-C   14 mg at 04/03/13 1226  . ondansetron (ZOFRAN) injection 4 mg  4 mg Intravenous Q6H PRN Shann Medal, MD   4 mg at 04/02/13 0420  . ondansetron (ZOFRAN) tablet 4 mg  4 mg Oral Q6H Shann Medal, MD   4 mg at 04/03/13 0954  . pantoprazole (PROTONIX) injection 40 mg  40 mg Intravenous Q12H Shann Medal, MD   40 mg at 04/03/13 1207  . sertraline (ZOLOFT) tablet 50 mg  50 mg Oral Daily Shann Medal, MD   50 mg at 04/03/13 0953  . sodium chloride 0.9 % injection 10-40 mL  10-40 mL Intracatheter PRN Zenovia Jarred, MD   10 mL at 04/03/13 0356  . sucralfate (CARAFATE) 1 GM/10ML suspension 1 g  1 g Oral TID WC & HS Shann Medal, MD   1 g at 04/03/13 1244  . thiamine (B-1) injection 100 mg  100 mg Intravenous Daily Shann Medal, MD   100 mg at 04/03/13 1208  . TPN (CLINIMIX) Adult without lytes   Intravenous Continuous TPN Gaye Alken Borgerding, [place]        LABS: Lab Results  Component Value Date   WBC 6.0 04/01/2013   HGB 9.8* 04/01/2013   HCT 31.3* 04/01/2013   MCV 79.2 04/01/2013   PLT 275 04/01/2013      Component Value Date/Time   NA 134* 04/03/2013 0325   K 3.9 04/03/2013 0325   CL 98 04/03/2013 0325   CO2 25 04/03/2013 0325   GLUCOSE 112* 04/03/2013 0325   BUN 12 04/03/2013 0325   CREATININE 0.67 04/03/2013 0325   CALCIUM 9.1 04/03/2013 0325   GFRNONAA >90 04/03/2013 0325   GFRAA >90 04/03/2013 0325   No results found for this basename: INR,  PROTIME   No results found for this basename: PTT    SOCIAL HISTORY: History   Social History  . Marital Status: Legally Separated    Spouse Name: N/A    Number of Children: N/A  . Years of Education: N/A   Occupational History  . Not on file.   Social History Main Topics  . Smoking status: Current Every Day Smoker --  1.00 packs/day for 15 years    Types: Cigarettes  . Smokeless tobacco: Never Used  . Alcohol Use: No  . Drug Use: No  . Sexual Activity: Not on file   Other Topics Concern  . Not on file   Social History Narrative  . No narrative on file    FAMILY HISTORY: Family History  Problem Relation Age of Onset  . Other Other   . Other Other      REVIEW OF SYSTEMS: Reviewed from chart for this admission.  DENTAL HISTORY: CHIEF COMPLAINT: Patient was referred for evaluation of left facial swelling. HPI: FARHIYA ROSTEN is a 43 year old female that was admitted with a history of nausea and vomiting. Patient has a pending surgery with Dr. Lucia Gaskins for her GJ stenosis with marginal erosions that is scheduled for 04/08/2013. Patient subsequently developed left facial swelling since last night. Dental consultation was then requested to evaluate left facial swelling and rule out dental etiology.  Patient currently denies any acute dental pain associated with the lower left molar. Patient indicates that she developed left submandibualr lymph node swelling since last night. Patient is now on Unasyn IV antibiotic therapy. Patient knows that she needs root canal therapy on tooth #18 and that she was planning to take care of it in late April 2015 although no appointment is scheduled.  The patient last saw her primary dentist, Dr. Idamae Schuller,  in [place], [place] approximately 2 months ago. Patient had tooth #20 removed at that time with no complications. Patient has dental insurance that will not go into the defect until July 2015 by her report.   DENTAL  EXAMINATION:  GENERAL: Patient is a well-developed, slightly build female in no acute distress. HEAD AND NECK: Patient has left neck lymphadenopathy. The patient has some left facial swelling.  The patient denies acute TMJ symptoms. INTRAORAL EXAM: Intraoral exam reveals normal saliva. I do not see any evidence of left buccal swelling or abscess formation. The patient has bilateral mandibular lingual tori. DENTITION: The patient is missing tooth numbers 1, 4, 13, 16, 17, 20, and 32. Spaces of tooth numbers 4 and 13 are closed secondary to previous orthodontic therapy. PERIODONTAL: Patient with relatively good oral hygiene with minimal plaque and calculus noted. DENTAL CARIES/SUBOPTIMAL RESTORATIONS: Patient has dental caries associated with the mesial of tooth #18. These caries are impinging on the pulp. ENDODONTIC: Patient denies acute pulpitis symptoms. Patient does have periapical pathology and radiolucency associated with the apices of tooth #18. CROWN AND BRIDGE: Patient has a crown on tooth #19. PROSTHODONTIC: Patient denies having any partial dentures. OCCLUSION: Patient has a stable occlusion at this time.  RADIOGRAPHIC INTERPRETATION: An orthopantogram was taken on 04/03/2013. There are missing tooth numbers 1, 4, 13, 16, 17, 20, and 32. Spaces of 4 and 13 are closed secondary to orthodontic therapy. The patient has dental caries associated with tooth #18. There also is periapical radiolucency associated with the apex of tooth #18. There is incipient bone loss noted. There is radiographic passive these associated with bilateral mandibular lingual tori.  ASSESSMENTS: 1. Left facial swelling-minimal 2. Left submandibular node swelling 3. Dental caries of #18 impinging of the pulp 4. 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 Plaque and calculus accumulations-minimal 6. Missing teeth 7. Stable occlusion   PLAN/RECOMMENDATIONS: 1. I discussed the risks,  benefits, and complications of various treatment options with the patient in relationship to her medical and dental conditions, left facial swelling, and impending  surgery with Dr. Lucia Gaskins.  We discussed various treatment options to include no treatment, referred to an oral surgeon for extraction of tooth #18, referral to an endodontist for root canal therapy of #18 with subsequent crown fabrication, periodontal therapy, dental restorations, implant therapy, and replacement of missing teeth as indicated. The patient currently wishes to  think about her options and discuss plan with the surgical team. The patient is considering whether to try to attempt to obtain root canal therapy as an outpatient prior to the Dr. Lucia Gaskins surgery or to proceed with root canal therapy after the Dr. Lucia Gaskins surgery   In the meantime, patient will be kept on Unasyn IV antibiotic therapy to hopefully result in resolution of the left facial swelling. Patient understands that she will need to proceed with evaluation for root canal therapy on a sooner than later basis and that there is some potential risk for a dental infection affecting her anticipated GI surgery with Dr. Lucia Gaskins in the future. I will be gone after this afternoon until Monday, 04/07/2013. My assistant, Camie Patience, can assist in referral to an endodontist on call if needed tomorrow morning.   2. Discussion of findings with medical team and coordination of future medical and dental care as needed.   Lenn Cal, DDS

## 2013-04-03 NOTE — Progress Notes (Signed)
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  Allergies  Allergen Reactions  . Nsaids     Anemia, and Gastric bypass  . Promethazine Hcl Other (See Comments)    Agitated, restless  . Tramadol Rash    Patient Measurements: Height: 5\' 4"  (162.6 cm) Weight: 93 lb 8 oz (42.411 kg) IBW/kg (Calculated) : 54.7   Vital Signs: Temp: 97.9 F (36.6 C) (03/26 0550) Temp src: Oral (03/26 0550) BP: 96/63 mmHg (03/26 0550) Pulse Rate: 89 (03/26 0550) Intake/Output from previous day: 03/25 0701 - 03/26 0700 In: 3060 [I.V.:2400; TPN:660] Out: 1550 [Urine:1550] Intake/Output from this shift:    Labs:  Recent Labs  04/01/13 0530  WBC 6.0  HGB 9.8*  HCT 31.3*  PLT 275     Recent Labs  04/01/13 0530 04/02/13 0545 04/03/13 0325  NA 136* 136* 134*  K 4.0 4.1 3.9  CL 100 98 98  CO2 27 26 25   GLUCOSE 119* 96 112*  BUN 4* 8 12  CREATININE 0.52 0.55 0.67  CALCIUM 8.7 9.0 9.1  MG 1.9 1.9 1.9  PHOS 3.8 4.7* 5.2*  PROT 6.1  --  7.1  ALBUMIN 3.2*  --  3.6  AST 16  --  18  ALT 7  --  7  ALKPHOS 63  --  79  BILITOT 0.3  --  0.3  TRIG 48  --   --   Corrected Ca=9.3 on 3/24 Estimated Creatinine Clearance: 61.3 ml/min (by C-G formula based on Cr of 0.67).    Recent Labs  04/02/13 2142 04/02/13 2344 04/03/13 0715  GLUCAP 98 109* 111*    Medical History: Past Medical History  Diagnosis Date  . ADD (attention deficit disorder)   . Anemia   . Blood transfusion without reported diagnosis   . Anxiety disorder     panic  . Anxiety   . MRSA (methicillin resistant Staphylococcus aureus)   . Pneumonia     [place] as child    Medications:  Scheduled:  . amoxicillin-clavulanate  1 tablet Oral Q12H  . amphetamine-dextroamphetamine  30 mg Oral BID  . enoxaparin (LOVENOX) injection  30 mg Subcutaneous Q24H  . feeding supplement (RESOURCE BREEZE)  1 Container Oral TID  BM  . insulin aspart  0-9 Units Subcutaneous Q8H  . nicotine  14 mg Transdermal Daily  . ondansetron  4 mg Oral Q6H  . pantoprazole (PROTONIX) IV  40 mg Intravenous Q12H  . sertraline  50 mg Oral Daily  . sucralfate  1 g Oral TID WC & HS  . thiamine IV  100 mg Intravenous Daily   Infusions:  . dextrose 5 % and 0.45 % NaCl with KCl 20 mEq/L 100 mL/hr at 04/03/13 1008  . Marland KitchenTPN (CLINIMIX-E) Adult 45 mL/hr at 04/02/13 1723   And  . fat emulsion 250 mL (04/02/13 1724)   PRN: acetaminophen, diphenhydrAMINE, HYDROcodone-acetaminophen, HYDROmorphone (DILAUDID) injection, LORazepam, ondansetron, sodium chloride  Insulin Requirements in the past 24 hours:  Sensitive SSI q8h started 3/24 0 units SSI required    Current Nutrition:  NPO expect for ice chips  Clinimix-E 5/15 at 45 mL/hr + Fat Emulsion 20% at 5 mL/hr  Maintenance IVF: D-5-1/2 NS + KCl 2mEq/L at 100 mL/hr  Assessment: 43 y/o F s/p RYGB in 2007 presented for upper endoscopy to evaluate persistent nausea, vomiting, and abdominal pain.  Endoscopy 3/23 revealed stenosis of gastrojejunal anastomosis with marginal  ulcer and erosion of silastic ring into adjacent area.  Orders received to begin TPN with pharmacy assistance.  Plan is for TPN x 7 days and then revision of GJ next Tuesday   Nutritional Goals:  Per RD assessment 3/23: Kcal: 1400-1600 /day  Protein: 65-80g / day  Goal Rate: Clinimix-E 5/15 at 60 mL/hr + IVFE 20% at 10 mL/hr to deliver 72 grams protein/d, 1502 KCal/d   3/24:  TPN was started 3/23 Glucose:   CBGs < 150, No SSI required  Electrolytes: Phos has trended up since starting TNA, 5.2 today;  Na slightly low, cannot adjust in TNA Renal:  SCr WNL, stable.  BUN low Hepatic:  LFTs below ULN Triglycerides:  WNL Prealbumin: 16.3 (3/23)   D#3 TPN (3/23 - present) Access:  PICC  Plan:  1. Change Clinimix to ELECTROLYTE FREE Clinimix 5/15 formulation given rise in phosphorus since start of TNA.  Increase  to goal rate 60 ml/hr at 1800.  Continue fat emulsion 20% at 10 ml/hr 2. Multivitamin Daily, Trace Elements on MWF only (due to national shortage) 4. BMet, Mag, Phos tomorrow.  Watch for possible evidence of refeeding syndrome. 5. Given patient's baseline dehydration will defer to MD on maintenance IVF rate adjustments as TPN advances. 6. Routine TPN labs on Mondays and Thursdays.   Valeriano Bain, Gaye Alken PharmD Pager #: [phone] 10:50 AM 04/03/2013

## 2013-04-03 NOTE — Progress Notes (Signed)
3 Days Post-Op  Subjective: Pt's pain about the same (although her exam is much improved).  No N/V.  Tolerating popsicle and ice chips.  C/o pain in left 2nd molar which was planned on dental intervention at the end of April.  Ambulating some.  Urinating and having flatus.  Objective: Vital signs in last 24 hours: Temp:  [97.9 F (36.6 C)-98.9 F (37.2 C)] 97.9 F (36.6 C) (03/26 0550) Pulse Rate:  [87-90] 89 (03/26 0550) Resp:  [16-18] 16 (03/26 0550) BP: (91-96)/(60-64) 96/63 mmHg (03/26 0550) SpO2:  [99 %-100 %] 100 % (03/26 0550) Weight:  [93 lb 8 oz (42.411 kg)] 93 lb 8 oz (42.411 kg) (03/25 1123) Last BM Date: 03/30/13  Intake/Output from previous day: 03/25 0701 - 03/26 0700 In: 3060 [I.V.:2400; TPN:660] Out: 1550 [Urine:1550] Intake/Output this shift:    PE: Gen:  Alert, NAD, pleasant Oral cavity:  Fracture left bottom second molar with associated edema to the gums and edema to the mandibular soft tissues as visible on her skin. Abd: concave, soft, minimal tenderness, ND, +BS, no HSM, abdominal scars noted  Lab Results:   Recent Labs  04/01/13 0530  WBC 6.0  HGB 9.8*  HCT 31.3*  PLT 275   BMET  Recent Labs  04/02/13 0545 04/03/13 0325  NA 136* 134*  K 4.1 3.9  CL 98 98  CO2 26 25  GLUCOSE 96 112*  BUN 8 12  CREATININE 0.55 0.67  CALCIUM 9.0 9.1   PT/INR No results found for this basename: LABPROT, INR,  in the last 72 hours CMP     Component Value Date/Time   NA 134* 04/03/2013 0325   K 3.9 04/03/2013 0325   CL 98 04/03/2013 0325   CO2 25 04/03/2013 0325   GLUCOSE 112* 04/03/2013 0325   BUN 12 04/03/2013 0325   CREATININE 0.67 04/03/2013 0325   CALCIUM 9.1 04/03/2013 0325   PROT 7.1 04/03/2013 0325   ALBUMIN 3.6 04/03/2013 0325   AST 18 04/03/2013 0325   ALT 7 04/03/2013 0325   ALKPHOS 79 04/03/2013 0325   BILITOT 0.3 04/03/2013 0325   GFRNONAA >90 04/03/2013 0325   GFRAA >90 04/03/2013 0325   Lipase     Component Value Date/Time   LIPASE 49  03/27/2013 1648       Studies/Results: Dg Ugi W/kub  04/01/2013   CLINICAL DATA:  Gastric outlet obstruction.  Prior gastrojejunal.  EXAM: UPPER GI SERIES W/ KUB  TECHNIQUE: After obtaining a scout radiograph a routine upper GI series was performed using Gastrografin.  FLUOROSCOPY TIME:  dictate in minutes & seconds  COMPARISON:  1 min 35 seconds.  FINDINGS: Visualize esophagus unremarkable. The esophagus empties into the stomach rapidly. The patient vomited during the procedure. Spot films and delayed images up to 45 min obtained and reveals gastrojejunostomy high-grade obstruction with near complete lack of contrast filling in the jejunal loop. No leak identified.  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 Delayed images revealed almost complete lack of emptying from the stomach.   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 Timmothy Sours, [place], [place]  Left bottom 2nd molar abscess Malnourished  Nausea/vomiting  ADD/anxiety disorder  Tobacco use  Anemia - likely due to gastric bypass   Plan:  1. Continue TNA, NPO except ice chips and popsicles.  2. Dr. Lucia Gaskins did UGI 04/01/13 shows large gastric pouch with essentially no emptying from pouch. It is unlikely she will tolerate going home.  3. Cont nicotine patch. Avoid smoking. Lovenox for DVT/SCD's also.  4. Ambulate and IS  5. Plan is revise [place] next Tuesday, 3/31 with Dr. Lucia Gaskins 6. Called Dr. Enrique Sack, dentist to see if he will come and see her - left message at [phone].  She apparently has a fractured tooth with an associated abscess.  She was pending dental intervention including root canal in April.  She is pending surgery for her GJ stenosis with marginal erosion.  Will start Augmentin BID for 7-14 days.   LOS:  4 days   Coralie Keens 04/03/2013, 7:22 AM Pager: [phone]  Agree with above. Looks much than when she was admitted. Now with tooth problem (which is old) of left lower jaw.  Alphonsa Overall, MD, [facility] Pager: [phone] Office phone:  [phone]

## 2013-04-04 ENCOUNTER — Encounter (HOSPITAL_COMMUNITY): Payer: Self-pay | Admitting: Surgery

## 2013-04-04 ENCOUNTER — Inpatient Hospital Stay (HOSPITAL_COMMUNITY): Payer: PRIVATE HEALTH INSURANCE

## 2013-04-04 DIAGNOSIS — Z9884 Bariatric surgery status: Secondary | ICD-10-CM

## 2013-04-04 DIAGNOSIS — K289 Gastrojejunal ulcer, unspecified as acute or chronic, without hemorrhage or perforation: Secondary | ICD-10-CM | POA: Diagnosis present

## 2013-04-04 LAB — GLUCOSE, CAPILLARY
GLUCOSE-CAPILLARY: 103 mg/dL — AB (ref 70–99)
GLUCOSE-CAPILLARY: 107 mg/dL — AB (ref 70–99)
Glucose-Capillary: 111 mg/dL — ABNORMAL HIGH (ref 70–99)
Glucose-Capillary: 129 mg/dL — ABNORMAL HIGH (ref 70–99)

## 2013-04-04 LAB — BASIC METABOLIC PANEL
BUN: 11 mg/dL (ref 6–23)
CHLORIDE: 101 meq/L (ref 96–112)
CO2: 27 meq/L (ref 19–32)
CREATININE: 0.53 mg/dL (ref 0.50–1.10)
Calcium: 8.5 mg/dL (ref 8.4–10.5)
GFR calc Af Amer: >90 mL/min (ref >90)
GFR calc non Af Amer: >90 mL/min (ref >90)
Glucose, Bld: 100 mg/dL — ABNORMAL HIGH (ref 70–99)
Potassium: 3.9 mEq/L (ref 3.7–5.3)
Sodium: 137 mEq/L (ref 137–147)

## 2013-04-04 LAB — PHOSPHORUS: PHOSPHORUS: 4.3 mg/dL (ref 2.3–4.6)

## 2013-04-04 LAB — MAGNESIUM: MAGNESIUM: 1.9 mg/dL (ref 1.5–2.5)

## 2013-04-04 MED ORDER — FAMOTIDINE IN NACL 20-0.9 MG/50ML-% IV SOLN
20.0000 mg | Freq: Two times a day (BID) | INTRAVENOUS | Status: DC
  Administered 2013-04-04 – 2013-04-11 (×14): 20 mg via INTRAVENOUS
  Filled 2013-04-04 (×14): qty 50

## 2013-04-04 MED ORDER — TRACE MINERALS CR-CU-F-FE-I-MN-MO-SE-ZN IV SOLN
INTRAVENOUS | Status: AC
  Administered 2013-04-04: 17:00:00 via INTRAVENOUS
  Filled 2013-04-04: qty 2000

## 2013-04-04 MED ORDER — PANTOPRAZOLE SODIUM 40 MG PO PACK
40.0000 mg | PACK | Freq: Two times a day (BID) | ORAL | Status: DC
  Administered 2013-04-04 – 2013-04-05 (×2): 40 mg
  Filled 2013-04-04 (×6): qty 20

## 2013-04-04 MED ORDER — FAT EMULSION 20 % IV EMUL
250.0000 mL | INTRAVENOUS | Status: AC
  Administered 2013-04-04: 250 mL via INTRAVENOUS
  Filled 2013-04-04: qty 250

## 2013-04-04 MED ORDER — KCL IN DEXTROSE-NACL 20-5-0.45 MEQ/L-%-% IV SOLN
INTRAVENOUS | Status: DC
  Administered 2013-04-06: 30 mL/h via INTRAVENOUS
  Filled 2013-04-04 (×5): qty 1000

## 2013-04-04 NOTE — Progress Notes (Signed)
PHARMACIST - PHYSICIAN COMMUNICATION CONCERNING: IV to Oral Route Change Policy  RECOMMENDATION: This patient is receiving Protonix by the intravenous route.  Based on criteria approved by the Pharmacy and Therapeutics Committee, these medications are being converted to the equivalent oral dose form(s).  DESCRIPTION: Pantoprazole (Protonix) injection is in critically short supply nationwide and our amount on hand has fallen to critically low levels.  After consultation with gastroenterology, the P&T committee has implemented the following emergency measures:  Pharmacy has been asked to automatically convert pantoprazole (Protonix) from IV to PO in all patients who can safely take an oral medication.  The criteria for this patient include:  The patient is eating (liquids, Resource breeze)   The patient has been taking other orally administered medications (sertraline, Adderall)   If you have questions about this conversion, please contact the Pharmacy Department   [phone]   [place] PharmD, [place] Pager [phone] 04/04/2013 11:02 AM

## 2013-04-04 NOTE — Progress Notes (Signed)
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  Allergies  Allergen Reactions  . Nsaids     Anemia, and Gastric bypass  . Promethazine Hcl Other (See Comments)    Agitated, restless  . Tramadol Rash    Patient Measurements: Height: 5\' 4"  (162.6 cm) Weight: 95 lb 8 oz (43.319 kg) IBW/kg (Calculated) : 54.7  Vital Signs: Temp: 98 F (36.7 C) (03/27 0514) Temp src: Oral (03/27 0514) BP: 87/51 mmHg (03/27 0514) Pulse Rate: 72 (03/27 0514)  Intake/Output from previous day: 03/26 0701 - 03/27 0700 In: 2950 [P.O.:30; I.V.:1600; IV Piggyback:300; TPN:1020] Out: 1350 [Urine:1350]  Labs: No results found for this basename: WBC, HGB, HCT, PLT, APTT, INR,  in the last 72 hours  Recent Labs  04/02/13 0545 04/03/13 0325 04/04/13 0520  NA 136* 134* 137  K 4.1 3.9 3.9  CL 98 98 101  CO2 26 25 27   GLUCOSE 96 112* 100*  BUN 8 12 11   CREATININE 0.55 0.67 0.53  CALCIUM 9.0 9.1 8.5  MG 1.9 1.9 1.9  PHOS 4.7* 5.2* 4.3  PROT  --  7.1  --   ALBUMIN  --  3.6  --   AST  --  18  --   ALT  --  7  --   ALKPHOS  --  79  --   BILITOT  --  0.3  --    Estimated Creatinine Clearance: 62.6 ml/min (by C-G formula based on Cr of 0.53).    Recent Labs  04/03/13 1616 04/03/13 2312 04/04/13 0706  GLUCAP 102* 86 111*    Medications:  Infusions:  . dextrose 5 % and 0.45 % NaCl with KCl 20 mEq/L 1 mL (04/03/13 2245)  . fat emulsion 250 mL (04/03/13 1803)  . TPN (CLINIMIX) Adult without lytes 60 mL/hr at 04/03/13 1802    Insulin Requirements in the past 24 hours:   CBGs: 86-111  Sensitive SSI q8h started 3/24, none given.  Current Nutrition:   NPO (except for popsicle)  Supplements: resource breeze TID  Clinimix 5/15 at 60 mL/hr + Fat Emulsion 20% at 10 mL/hr  Maintenance IVF: D-5-1/2 NS + KCl 23mEq/L at 100 mL/hr  Nutritional Goals:   Per RD assessment 3/23: Kcal:  1400-1600 /day, Protein: 65-80g / day  Goal Rate:  Clinimix 5/15 at 60 mL/hr + IVFE 20% at 10 mL/hr to deliver 72 grams protein/d, 1502 KCal/d    Assessment: 43 y/o F s/p RYGB in 2007 presented for upper endoscopy to evaluate persistent nausea, vomiting, and abdominal pain.  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  Orders received to begin TPN with pharmacy assistance.  Plan is for TPN x 7 days and then revision of GJ next Tuesday   3/27: Day # 5 TPN.  She remains NPO except ice chips and popsicles, but is taking resource breeze TID.  No N/V reported.  Plan is for revision of [place] on 3/31.  She was started on IV antibiotics for dental abscess yesterday.  Labs have improved today with no evidence of refeeding syndrome.   Renal function: SCr stable, CrCl ~ 63 ml/min  Hepatic function: WNL (3/26)  Electrolytes: All elytes WNL.  Elevated phos improved (5.2 > 4.3) after removing electrolytes from TNA on 3/26.  Pre-Albumin: 16.3 (3/23)  TG:   48 (3/24)  Glucose: CBGs < 150,  no insulin required.  TPN Access: PICC (placed 3/23)    Plan:  At 1800 today:  Continue Clinimix E 5/15 at 60 ml/hr - Resume with ELECTROLYTES   20% fat emulsion at 10 ml/hr.  TNA to contain standard multivitamins daily and trace elements only MWF (due to national supply shortage)  Decrease IVF to 30 ml/hr. - Pharmacy will defer IVF rate to MD given baseline dehydration.  PA requests IVF rate change to 30 ml/hr.  TNA lab panels on Mondays & Thursdays.  BMET, Mag and Phos tomorrow.  Gretta Arab PharmD, BCPS Pager [phone] 04/04/2013 8:37 AM

## 2013-04-04 NOTE — Progress Notes (Addendum)
4 Days Post-Op  Subjective: Complaining of her right shoulder now hurting for 2 weeks. She has pain with ROM.  This is a new complaint today as far as I know.  Objective: Vital signs in last 24 hours: Temp:  [97.4 F (36.3 C)-98.1 F (36.7 C)] 97.4 F (36.3 C) (03/27 1405) Pulse Rate:  [71-81] 71 (03/27 1405) Resp:  [14-20] 20 (03/27 1405) BP: (87-99)/(51-68) 90/68 mmHg (03/27 1405) SpO2:  [96 %-100 %] 96 % (03/27 1405) Weight:  [43.319 kg (95 lb 8 oz)-43.591 kg (96 lb 1.6 oz)] 43.319 kg (95 lb 8 oz) (03/27 0526) Last BM Date: 03/30/13 Diet: sips / TNA Afebrile, VSS  Intake/Output from previous day: 03/26 0701 - 03/27 0700 In: 2950 [P.O.:30; I.V.:1600; IV Piggyback:300; TPN:1020] Out: 1350 [Urine:1350] Intake/Output this shift: Total I/O In: 680 [I.V.:400; TPN:280] Out: 700 [Urine:700]  General appearance: alert, cooperative and no distress Resp: clear to auscultation bilaterally GI: soft, non-tender; bowel sounds normal; no masses,  no organomegaly Extremities: she complains of pain with raising right shoulder.  i do not see any deformities  Lab Results:  No results found for this basename: WBC, HGB, HCT, PLT,  in the last 72 hours  BMET  Recent Labs  04/03/13 0325 04/04/13 0520  NA 134* 137  K 3.9 3.9  CL 98 101  CO2 25 27  GLUCOSE 112* 100*  BUN 12 11  CREATININE 0.67 0.53  CALCIUM 9.1 8.5   PT/INR No results found for this basename: LABPROT, INR,  in the last 72 hours   Recent Labs Lab 03/30/13 2110 04/01/13 0530 04/03/13 0325  AST 28 16 18   ALT 12 7 7   ALKPHOS 85 63 79  BILITOT 0.4 0.3 0.3  PROT 8.6* 6.1 7.1  ALBUMIN 4.6 3.2* 3.6     Lipase     Component Value Date/Time   LIPASE 49 03/27/2013 1648     Studies/Results: Dg Orthopantogram  04/03/2013   CLINICAL DATA:  Left-sided facial swelling  EXAM: ORTHOPANTOGRAM/PANORAMIC  COMPARISON:  None.  FINDINGS: The mandible is well visualized and within normal limits. No acute fracture is seen.  Multiple dental fillings are noted. Some increase lucency is noted surrounding the left second mandibular molar root. Additionally lucency is noted in the tooth just below a previously placed filling. These are suspicious for dental caries as well as a possibility of periapical abscess.  IMPRESSION: Changes in the left second mandibular molar suspicious of dental caries and underlying possible periapical abscess. Clinical correlation is recommended.   Electronically Signed   By: Inez Catalina M.D.   On: 04/03/2013 12:08    Medications: . amphetamine-dextroamphetamine  30 mg Oral BID  . ampicillin-sulbactam (UNASYN) IV  3 g Intravenous Q6H  . enoxaparin (LOVENOX) injection  30 mg Subcutaneous Q24H  . feeding supplement (RESOURCE BREEZE)  1 Container Oral TID BM  . insulin aspart  0-9 Units Subcutaneous Q8H  . nicotine  14 mg Transdermal Daily  . ondansetron  4 mg Oral Q6H  . pantoprazole sodium  40 mg Per Tube BID  . sertraline  50 mg Oral Daily  . sucralfate  1 g Oral TID WC & HS  . 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    Plan:  Appreciate Dr. Ritta Slot help.  We plan to keep pt here and do surgery next week, and just keep her on antibiotics.  I will get a film of her shoulder and give her a K pad.  If necessary we can get Ortho to see later.  We discussed the protonix shortage and we decided to put her on IV Pepcid for the interim.    LOS: 5 days    JENNINGS,WILLARD 04/04/2013  Agree with above. Competing problems of gastric outlet obstruction and left lower jaw/tooth abscess.   Discussed thoroughly  with the patient. I think she will be best served with gastrojejunal revision first.  Then take of tooth.  She has found a dentist/endodonist and will arrange an evaluation about 7 to 14 days after surgery. Current Protonix shortage in the hospital - addressed by Will's note. She is on for gastrojejunal revision next Tuesday, 3/31.  Alphonsa Overall, MD, [facility] Pager: [phone] Office phone:  [phone]

## 2013-04-05 DIAGNOSIS — R64 Cachexia: Secondary | ICD-10-CM

## 2013-04-05 DIAGNOSIS — E43 Unspecified severe protein-calorie malnutrition: Secondary | ICD-10-CM

## 2013-04-05 DIAGNOSIS — Z720 Tobacco use: Secondary | ICD-10-CM

## 2013-04-05 LAB — BASIC METABOLIC PANEL
BUN: 16 mg/dL (ref 6–23)
CO2: 27 mEq/L (ref 19–32)
Calcium: 9.1 mg/dL (ref 8.4–10.5)
Chloride: 101 mEq/L (ref 96–112)
Creatinine, Ser: 0.56 mg/dL (ref 0.50–1.10)
GFR calc Af Amer: >90 mL/min (ref >90)
Glucose, Bld: 94 mg/dL (ref 70–99)
Potassium: 3.8 mEq/L (ref 3.7–5.3)
SODIUM: 140 meq/L (ref 137–147)

## 2013-04-05 LAB — MAGNESIUM: Magnesium: 2 mg/dL (ref 1.5–2.5)

## 2013-04-05 LAB — GLUCOSE, CAPILLARY
Glucose-Capillary: 105 mg/dL — ABNORMAL HIGH (ref 70–99)
Glucose-Capillary: 110 mg/dL — ABNORMAL HIGH (ref 70–99)
Glucose-Capillary: 99 mg/dL (ref 70–99)

## 2013-04-05 LAB — PHOSPHORUS: PHOSPHORUS: 4.6 mg/dL (ref 2.3–4.6)

## 2013-04-05 MED ORDER — FAT EMULSION 20 % IV EMUL
250.0000 mL | INTRAVENOUS | Status: AC
  Administered 2013-04-05: 250 mL via INTRAVENOUS
  Filled 2013-04-05: qty 250

## 2013-04-05 MED ORDER — M.V.I. ADULT IV INJ
INTRAVENOUS | Status: AC
  Administered 2013-04-05: 17:00:00 via INTRAVENOUS
  Filled 2013-04-05: qty 2000

## 2013-04-05 NOTE — Progress Notes (Signed)
[place], MD, [address] [place], [place] [address] Phone: [phone] FAX: [place] [phone] [birth year]  CARE TEAM:  PCP: Ruben Reason, MD  Outpatient Care Team: Patient Care Team: Posey Boyer, MD as PCP - General (Family Medicine) [name], MD as Consulting Physician (Family Medicine)  Inpatient Treatment Team: Treatment Team: Attending Provider: Nolon Nations, MD; Consulting Physician: Md Edison Pace, MD; Consulting Physician: Shann Medal, MD; Dietitian: Christie Beckers, RD; Registered Nurse: Rise Patience, RN; Registered Nurse: Fulton Reek, [place]; Registered Nurse: Roma Schanz, RN; Registered Nurse: Emmie Niemann, RN; Registered Nurse: Celedonio Savage, RN   Subjective:  Dressing room.  Patient very anxious.  Worried about taking Lovenox given history of GI bleed in past.  Trying to quit smoking with patch.  Not getting out of bed.  Trying to get out more.  Tired.  Many questions about findings on endoscopy and recommendations by Dr. Raliegh Ip  Objective:  Vital signs:  Filed Vitals:   04/04/13 1405 04/04/13 2110 04/05/13 0600 04/05/13 0627  BP: 90/68 114/76 103/64   Pulse: 71 85 74   Temp: 97.4 F (36.3 C) 97.8 F (36.6 C) 97.9 F (36.6 C)   TempSrc: Oral Oral Oral   Resp: 20 18 18    Height:      Weight:    95 lb 11.2 oz (43.409 kg)  SpO2: 96% 93% 99%     Last BM Date: 03/30/13  Intake/Output   Yesterday:  03/27 0701 - 03/28 0700 In: 2760 [I.V.:1560; TPN:1200] Out: 1500 [Urine:1500] This shift:     Bowel function:  Flatus: y  BM: n  Drain: n/a  Physical Exam:  General: Very cachectic.  Pt awake/alert/oriented x4 in no acute distress Eyes: PERRL, normal EOM.  Sclera clear.  No icterus Neuro: CN II-XII intact w/o focal sensory/motor deficits. Lymph: No head/neck/groin lymphadenopathy Psych:  No  delerium/psychosis/paranoia.  Mildly anxious but consolable HENT: Normocephalic, Mucus membranes moist.  No thrush Neck: Supple, No tracheal deviation Chest: No chest wall pain w good excursion CV:  Pulses intact.  Regular rhythm MS: Normal AROM mjr joints.  No obvious deformity Abdomen: Soft.  Nondistended.  Nontender.  No evidence of peritonitis.  No incarcerated hernias. Ext:  SCDs BLE.  Thin  No mjr edema.  No cyanosis Skin: No petechiae / purpura   Problem List:   Principal Problem:   Gastrojejunal ulcer s/p Lap gastric bypass Active Problems:   Anxiety disorder   Vomiting   Protein-calorie malnutrition, severe   S/P gastric bypass 2007   Tobacco abuse   Cachexia   Assessment  [name]  43 y.o. female  5 Days Post-Op  Procedure(s): ESOPHAGOGASTRODUODENOSCOPY (EGD)  Stabilized but rather cachectic and anxious.  Plan:  Continue IV nutrition.  Palliate pain and nausea.  Anxiolysis.  Anemia but improved from bleeding episode.  Tried to reassure her the importance of DVT prophylaxis.  We will monitor if she has recurring symptoms.  She seemed more reassured.  Iron for anemia  Agree with surgical evaluation with probable revision of gastrojejunostomy to remove ulcerated region next week when nutrition level better and physically stronger.  -VTE prophylaxis- SCDs, etc  -mobilize as tolerated to help recovery  Adin Hector, M.D., F.A.C.S. Gastrointestinal and Minimally Invasive Surgery [facility] [address] [place], [place] [address] [phone] Main / Paging  04/05/2013   Results:   Labs: Results for orders placed during the hospital encounter of 03/30/13 (from the past 48 hour(s))  GLUCOSE, CAPILLARY     Status: Abnormal   Collection Time    04/03/13  4:16 PM      Result Value Ref Range   Glucose-Capillary 102 (*) 70 - 99 mg/dL  GLUCOSE, CAPILLARY     Status: None   Collection Time    04/03/13  11:12 PM      Result Value Ref Range   Glucose-Capillary 86  70 - 99 mg/dL  BASIC METABOLIC PANEL     Status: Abnormal   Collection Time    04/04/13  5:20 AM      Result Value Ref Range   Sodium 137  137 - 147 mEq/L   Potassium 3.9  3.7 - 5.3 mEq/L   Chloride 101  96 - 112 mEq/L   CO2 27  19 - 32 mEq/L   Glucose, Bld 100 (*) 70 - 99 mg/dL   BUN 11  6 - 23 mg/dL   Creatinine, Ser 0.53  0.50 - 1.10 mg/dL   Calcium 8.5  8.4 - 10.5 mg/dL   GFR calc non Af Amer >90  >90 mL/min   GFR calc Af Amer >90  >90 mL/min   Comment: (NOTE)     The eGFR has been calculated using the CKD EPI equation.     This calculation has not been validated in all clinical situations.     eGFR's persistently <90 mL/min signify possible Chronic Kidney     Disease.  PHOSPHORUS     Status: None   Collection Time    04/04/13  5:20 AM      Result Value Ref Range   Phosphorus 4.3  2.3 - 4.6 mg/dL  MAGNESIUM     Status: None   Collection Time    04/04/13  5:20 AM      Result Value Ref Range   Magnesium 1.9  1.5 - 2.5 mg/dL  GLUCOSE, CAPILLARY     Status: Abnormal   Collection Time    04/04/13  7:06 AM      Result Value Ref Range   Glucose-Capillary 111 (*) 70 - 99 mg/dL  GLUCOSE, CAPILLARY     Status: Abnormal   Collection Time    04/04/13  4:03 PM      Result Value Ref Range   Glucose-Capillary 107 (*) 70 - 99 mg/dL   Comment 1 Notify RN     Comment 2 Documented in Chart    GLUCOSE, CAPILLARY     Status: Abnormal   Collection Time    04/04/13  8:02 PM      Result Value Ref Range   Glucose-Capillary 129 (*) 70 - 99 mg/dL  GLUCOSE, CAPILLARY     Status: Abnormal   Collection Time    04/04/13 11:53 PM      Result Value Ref Range   Glucose-Capillary 103 (*) 70 - 99 mg/dL  BASIC METABOLIC PANEL     Status: None   Collection Time    04/05/13  5:24 AM      Result Value Ref Range   Sodium 140  137 - 147 mEq/L   Potassium 3.8  3.7 - 5.3 mEq/L   Chloride 101  96 - 112 mEq/L   CO2 27  19 - 32 mEq/L    Glucose, Bld 94  70 - 99 mg/dL   BUN 16  6 - 23 mg/dL  Creatinine, Ser 0.56  0.50 - 1.10 mg/dL   Calcium 9.1  8.4 - 10.5 mg/dL   GFR calc non Af Amer >90  >90 mL/min   GFR calc Af Amer >90  >90 mL/min   Comment: (NOTE)     The eGFR has been calculated using the CKD EPI equation.     This calculation has not been validated in all clinical situations.     eGFR's persistently <90 mL/min signify possible Chronic Kidney     Disease.  MAGNESIUM     Status: None   Collection Time    04/05/13  5:24 AM      Result Value Ref Range   Magnesium 2.0  1.5 - 2.5 mg/dL  PHOSPHORUS     Status: None   Collection Time    04/05/13  5:24 AM      Result Value Ref Range   Phosphorus 4.6  2.3 - 4.6 mg/dL  GLUCOSE, CAPILLARY     Status: Abnormal   Collection Time    04/05/13  8:04 AM      Result Value Ref Range   Glucose-Capillary 105 (*) 70 - 99 mg/dL    Imaging / Studies: Dg Orthopantogram  04/03/2013   CLINICAL DATA:  Left-sided facial swelling  EXAM: ORTHOPANTOGRAM/PANORAMIC  COMPARISON:  None.  FINDINGS: The mandible is well visualized and within normal limits. No acute fracture is seen. Multiple dental fillings are noted. Some increase lucency is noted surrounding the left second mandibular molar root. Additionally lucency is noted in the tooth just below a previously placed filling. These are suspicious for dental caries as well as a possibility of periapical abscess.  IMPRESSION: Changes in the left second mandibular molar suspicious of dental caries and underlying possible periapical abscess. Clinical correlation is recommended.   Electronically Signed   By: Inez Catalina M.D.   On: 04/03/2013 12:08   Dg Shoulder Right  04/04/2013   CLINICAL DATA:  Pain.  EXAM: RIGHT SHOULDER - 2+ VIEW  COMPARISON:  None.  FINDINGS: Right PICC line noted. No acute bony or joint abnormality. No evidence of fracture, dislocation, or separation. Mild soft tissue swelling over the shoulder cannot be excluded. Clinical  correlation suggested.  IMPRESSION: 1. PICC line noted. 2. No acute bony or joint abnormality. 3. Mild soft tissue swelling over the shoulder cannot be excluded, clinical correlation suggested.   Electronically Signed   By: Marcello Moores  Register   On: 04/04/2013 16:02    Medications / Allergies: per chart  Antibiotics: Anti-infectives   Start     Dose/Rate Route Frequency Ordered Stop   04/03/13 1400  Ampicillin-Sulbactam (UNASYN) 3 g in sodium chloride 0.9 % 100 mL IVPB     3 g 100 mL/hr over 60 Minutes Intravenous Every 6 hours 04/03/13 1339     04/03/13 1000  amoxicillin-clavulanate (AUGMENTIN) 875-125 MG per tablet 1 tablet  Status:  Discontinued     1 tablet Oral Every 12 hours 04/03/13 0851 04/03/13 1335       Note: This dictation was prepared with Dragon/digital dictation along with [employer]. Any transcriptional errors that result from this process are unintentional.

## 2013-04-05 NOTE — Discharge Instructions (Signed)
STOP SMOKING!  We strongly recommend that you stop smoking.  Smoking increases the risk of surgery including infection in the form of an open wound, pus formation, abscess, hernia at an incision on the abdomen, etc.  You have an increased risk of other MAJOR complications such as stroke, heart attack, forming clots in the leg and/or lungs, and death.    Smoking Cessation Quitting smoking is important to your health and has many advantages. However, it is not always easy to quit since nicotine is a very addictive drug. Often times, people try 3 times or more before being able to quit. This document explains the best ways for you to prepare to quit smoking. Quitting takes hard work and a lot of effort, but you can do it. ADVANTAGES OF QUITTING SMOKING  You will live longer, feel better, and live better.  Your body will feel the impact of quitting smoking almost immediately.  Within 20 minutes, blood pressure decreases. Your pulse returns to its normal level.  After 8 hours, carbon monoxide levels in the blood return to normal. Your oxygen level increases.  After 24 hours, the chance of having a heart attack starts to decrease. Your breath, hair, and body stop smelling like smoke.  After 48 hours, damaged nerve endings begin to recover. Your sense of taste and smell improve.  After 72 hours, the body is virtually free of nicotine. Your bronchial tubes relax and breathing becomes easier.  After 2 to 12 weeks, lungs can hold more air. Exercise becomes easier and circulation improves.  The risk of having a heart attack, stroke, cancer, or lung disease is greatly reduced.  After 1 year, the risk of coronary heart disease is cut in half.  After 5 years, the risk of stroke falls to the same as a nonsmoker.  After 10 years, the risk of lung cancer is cut in half and the risk of other cancers decreases significantly.  After 15 years, the risk of coronary heart disease drops, usually to the level  of a nonsmoker.  If you are pregnant, quitting smoking will improve your chances of having a healthy baby.  The people you live with, especially any children, will be healthier.  You will have extra money to spend on things other than cigarettes. QUESTIONS TO THINK ABOUT BEFORE ATTEMPTING TO QUIT You may want to talk about your answers with your caregiver.  Why do you want to quit?  If you tried to quit in the past, what helped and what did not?  What will be the most difficult situations for you after you quit? How will you plan to handle them?  Who can help you through the tough times? Your family? Friends? A caregiver?  What pleasures do you get from smoking? What ways can you still get pleasure if you quit? Here are some questions to ask your caregiver:  How can you help me to be successful at quitting?  What medicine do you think would be best for me and how should I take it?  What should I do if I need more help?  What is smoking withdrawal like? How can I get information on withdrawal? GET READY  Set a quit date.  Change your environment by getting rid of all cigarettes, ashtrays, matches, and lighters in your home, car, or work. Do not let people smoke in your home.  Review your past attempts to quit. Think about what worked and what did not. GET SUPPORT AND ENCOURAGEMENT You have a  better chance of being successful if you have help. You can get support in many ways.  Tell your family, friends, and co-workers that you are going to quit and need their support. Ask them not to smoke around you.  Get individual, group, or telephone counseling and support. Programs are available at [employer] and health centers. Call your local health department for information about programs in your area.  Spiritual beliefs and practices may help some smokers quit.  Download a "quit meter" on your computer to keep track of quit statistics, such as how long you have gone without  smoking, cigarettes not smoked, and money saved.  Get a self-help book about quitting smoking and staying off of tobacco. [place] yourself from urges to smoke. Talk to someone, go for a walk, or occupy your time with a task.  Change your normal routine. Take a different route to work. Drink tea instead of coffee. Eat breakfast in a different place.  Reduce your stress. Take a hot bath, exercise, or read a book.  Plan something enjoyable to do every day. Reward yourself for not smoking.  Explore interactive web-based programs that specialize in helping you quit. GET MEDICINE AND USE IT CORRECTLY Medicines can help you stop smoking and decrease the urge to smoke. Combining medicine with the above behavioral methods and support can greatly increase your chances of successfully quitting smoking.  Nicotine replacement therapy helps deliver nicotine to your body without the negative effects and risks of smoking. Nicotine replacement therapy includes nicotine gum, lozenges, inhalers, nasal sprays, and skin patches. Some may be available over-the-counter and others require a prescription.  Antidepressant medicine helps people abstain from smoking, but how this works is unknown. This medicine is available by prescription.  Nicotinic receptor partial agonist medicine simulates the effect of nicotine in your brain. This medicine is available by prescription. Ask your caregiver for advice about which medicines to use and how to use them based on your health history. Your caregiver will tell you what side effects to look out for if you choose to be on a medicine or therapy. Carefully read the information on the package. Do not use any other product containing nicotine while using a nicotine replacement product.  RELAPSE OR DIFFICULT SITUATIONS Most relapses occur within the first 3 months after quitting. Do not be discouraged if you start smoking again. Remember, most  people try several times before finally quitting. You may have symptoms of withdrawal because your body is used to nicotine. You may crave cigarettes, be irritable, feel very hungry, cough often, get headaches, or have difficulty concentrating. The withdrawal symptoms are only temporary. They are strongest when you first quit, but they will go away within 10 14 days. To reduce the chances of relapse, try to:  Avoid drinking alcohol. Drinking lowers your chances of successfully quitting.  Reduce the amount of caffeine you consume. Once you quit smoking, the amount of caffeine in your body increases and can give you symptoms, such as a rapid heartbeat, sweating, and anxiety.  Avoid smokers because they can make you want to smoke.  Do not let weight gain distract you. Many smokers will gain weight when they quit, usually less than 10 pounds. Eat a healthy diet and stay active. You can always lose the weight gained after you quit.  Find ways to improve your mood other than smoking. FOR MORE INFORMATION  www.smokefree.gov    While it can be one of the  most difficult things to do, the [place] community has programs to help you stop.  Consider talking with your primary care physician about options.  Also, Smoking Cessation classes are available through the [facility]:  The smoking cessation program is a proven-effective program from the American Lung Association. The program is available for anyone 60 and older who currently smokes. The program lasts for 7 weeks and is 8 sessions. Each class will be approximately 1 1/2 hours. The program is every Tuesday.  All classes are 12-1:30pm and same location.  Event Location Information:  Location: [facility] 2nd Floor Conference Room 2-037; located next to [facility] cross streets: [place] Entrance into the [facility] [address] is adjacent to the [employer] main entrance.  The conference room is located on the 2nd floor.  Parking Instructions: Visitor parking is adjacent to [employer] main entrance and the [place]    A smoking cessation program is also offered through the [facility]. Register online at [email or web address].gl or call [phone] for more information.   Tobacco cessation counseling is available at [facility]. Call [phone] for a free appointment.   Tobacco cessation classes also are available through the [facility] in [place]. For information, call [phone].   The Patient Education Network features videos on tobacco cessation. Please consult your listings in the center of this book to find instructions on how to access this resource.   If you want more information, ask your nurse.  LAPAROSCOPIC SURGERY: POST OP INSTRUCTIONS  1. DIET: Follow a light bland diet the first 24 hours after arrival home, such as soup, liquids, crackers, etc.  Be sure to include lots of fluids daily.  Avoid fast food or heavy meals as your are more likely to get nauseated.  Eat a low fat the next few days after surgery.   2. Take your usually prescribed home medications unless otherwise directed. 3. PAIN CONTROL: a. Pain is best controlled by a usual combination of three different methods TOGETHER: i. Ice/Heat ii. Over the counter pain medication iii. Prescription pain medication b. Most patients will experience some swelling and bruising around the incisions.  Ice packs or heating pads (30-60 minutes up to 6 times a day) will help. Use ice for the first few days to help decrease swelling and bruising, then switch to heat to help relax tight/sore spots and speed recovery.  Some people prefer to use ice alone, heat alone, alternating between ice & heat.  Experiment to what works for you.  Swelling and bruising can take several weeks to resolve.   c. It is helpful to take an over-the-counter pain  medication regularly for the first few weeks.  Choose one of the following that works best for you: i. Naproxen (Aleve, etc)  Two 220mg  tabs twice a day ii. Ibuprofen (Advil, etc) Three 200mg  tabs four times a day (every meal & bedtime) iii. Acetaminophen (Tylenol, etc) 500-650mg  four times a day (every meal & bedtime) d. A  prescription for pain medication (such as oxycodone, hydrocodone, etc) should be given to you upon discharge.  Take your pain medication as prescribed.  i. If you are having problems/concerns with the prescription medicine (does not control pain, nausea, vomiting, rash, itching, etc), please call us [phone] to see if we need to switch you to a different pain medicine that will work better for you and/or control  your side effect better. ii. If you need a refill on your pain medication, please contact your pharmacy.  They will contact our office to request authorization. Prescriptions will not be filled after 5 pm or on week-ends. 4. Avoid getting constipated.  Between the surgery and the pain medications, it is common to experience some constipation.  Increasing fluid intake and taking a fiber supplement (such as Metamucil, Citrucel, FiberCon, MiraLax, etc) 1-2 times a day regularly will usually help prevent this problem from occurring.  A mild laxative (prune juice, Milk of Magnesia, MiraLax, etc) should be taken according to package directions if there are no bowel movements after 48 hours.   5. Watch out for diarrhea.  If you have many loose bowel movements, simplify your diet to bland foods & liquids for a few days.  Stop any stool softeners and decrease your fiber supplement.  Switching to mild anti-diarrheal medications (Kayopectate, Pepto Bismol) can help.  If this worsens or does not improve, please call us. 6. Wash / shower every day.  You may shower over the dressings as they are waterproof.  Continue to shower over incision(s) after the dressing is off. 7. Remove your  waterproof bandages 5 days after surgery.  You may leave the incision open to air.  You may replace a dressing/Band-Aid to cover the incision for comfort if you wish.  8. ACTIVITIES as tolerated:   a. You may resume regular (light) daily activities beginning the next day--such as daily self-care, walking, climbing stairs--gradually increasing activities as tolerated.  If you can walk 30 minutes without difficulty, it is safe to try more intense activity such as jogging, treadmill, bicycling, low-impact aerobics, swimming, etc. b. Save the most intensive and strenuous activity for last such as sit-ups, heavy lifting, contact sports, etc  Refrain from any heavy lifting or straining until you are off narcotics for pain control.   c. DO NOT PUSH THROUGH PAIN.  Let pain be your guide: If it hurts to do something, don't do it.  Pain is your body warning you to avoid that activity for another week until the pain goes down. d. You may drive when you are no longer taking prescription pain medication, you can comfortably wear a seatbelt, and you can safely maneuver your car and apply brakes. e. Dennis Bast may have sexual intercourse when it is comfortable.  9. FOLLOW UP in our office a. Please call [facility] at [phone] to set up an appointment to see your surgeon in the office for a follow-up appointment approximately 2-3 weeks after your surgery. b. Make sure that you call for this appointment the day you arrive home to insure a convenient appointment time. 10. IF YOU HAVE DISABILITY OR FAMILY LEAVE FORMS, BRING THEM TO THE OFFICE FOR PROCESSING.  DO NOT GIVE THEM TO YOUR DOCTOR.   WHEN TO CALL us [phone]: 1. Poor pain control 2. Reactions / problems with new medications (rash/itching, nausea, etc)  3. Fever over 101.5 F (38.5 C) 4. Inability to urinate 5. Nausea and/or vomiting 6. Worsening swelling or bruising 7. Continued bleeding from incision. 8. Increased pain, redness, or drainage from the  incision   The clinic staff is available to answer your questions during regular business hours (8:30am-5pm).  Please dont hesitate to call and ask to speak to one of our nurses for clinical concerns.   If you have a medical emergency, go to the nearest emergency room or call 911.  A surgeon from [facility]  is always on call at the [facility], [place], [place], [place], [place]  [address] ? MAIN: [phone] ? TOLL FREE: [phone] ?  FAX [phone] [email or web address]  Diet Following Bariatric Surgery The bariatric diet is designed to provide fluids and nourishment while promoting weight loss after bariatric surgery. The diet is divided into 3 stages. The rate of progression varies based on individual food tolerance. DIET FOLLOWING BARIATRIC SURGERY The diet following surgery is divided into 3 stages to allow a gradual adjustment. It is very important to the success of your surgery to:  Progress to each stage slowly.  Eat at set times.  Chew food well and stop eating when you are full.  Not drink liquids 30 minutes before and after meals. If you feel tightness or pressure in your chest, that means you are full. Wait 30 minutes before you try to eat again. STAGE 1 BARIATRIC DIET - ABOUT 2 WEEKS IN DURATION   The diet begins the day of surgery. It will last about 1 to 2 weeks after surgery. Your surgeon may have individual guidelines for you about specific foods or the progression of your diet. Follow your surgeon's guidelines.  If clear liquids are well-tolerated without vomiting, your caregiver will add a 4 oz to 6 oz high protein, low-calorie liquid supplement. You could add this to your meal plan 3 times daily. You will need at least 60 g to 80 g of protein daily or as determined by your Registered Dietitian.  Guidelines for choosing a protein supplement include:  At least 15 g of protein per 8 oz  serving.  Less than 20 g total carbohydrate per 8 oz serving.  Less than 5 g fat per 8 oz serving.  Avoid carbonated beverages, caffeine, alcohol, and concentrated sweets such as sugar, cakes, and cookies.  Right after surgery, you may only be able to eat 3 to 4 tsp per meal. Your maximum volume should not exceed  to  cup total. Do not eat or drink more than 1 oz or 2 tbs every 15 minutes.  Take a chewable multivitamin and mineral supplement.  Drink at least 48 oz of fluid daily, which includes your protein supplement. Food and beverages from the list below are allowed at set times (for example at 8 AM, 12 noon, or 5 PM):  Decaffeinated coffee or tea.  100% fruit juice.  Diet or sugar-free drinks.  Broth.  Blenderized soup.  Skim milk or lactose-free milk.  Sugar-free gelatin dessert or frozen ice pops.  Mashed potatoes.  Yogurt (artificially sweetened).  Sugar-free pudding.  Blended low-fat cottage cheese.  Unsweetened applesauce, grits, or hot wheat cereal. Four to six ounces of a liquid protein supplement from the list below is recommended for snacks at 10 AM, 2 PM, and 8 PM.  STAGE 2 BARIATRIC DIET (SOFT DIET) - ABOUT 4 WEEKS IN DURATION  About 2 weeks after surgery, your caregiver will progress your diet to this stage. Foods may need to be blended to the consistency of applesauce. Choose low-fat foods (less than 5 g of fat per serving) and avoid concentrated sweets and sugar (less than 10 g of sugar per serving). Meals should not exceed  to  cup total. This stage will last about 4 weeks. It is recommended that you meet with your dietitian at this stage to begin preparation for the last stage. This stage consists of 3 meals a day with a liquid protein supplement  between meals twice daily. Do not drink liquids with foods. You must wait 30 minutes for the stomach pouch to empty before drinking. Chew food well. The food must be almost liquified before swallowing. Soft  foods from the list below can now be slowly added to your diet:  Soft fruit (soft canned fruit in light syrup or natural juice, banana, melon, peaches, pears, or strawberries).  Cooked vegetables.  Toast or crackers (becomes soft after chewing 20 times).  Hot wheat cereal.  Fish.  Eggs (scrambled, soft-boiled). STAGE 3 BARIATRIC DIET (REGULAR DIET) - ABOUT 6 to 8 WEEKS AFTER SURGERY About 6 to 8 weeks after surgery, you will be advanced to food that is regular in texture. This diet should include all food groups. The diet will continue to promote weight loss. Meals should not exceed  to 1 cup total. Your dietitian will be available to assist you in meal planning and additional behavioral strategies to make this final stage a long-term success. Slowly add foods of regular consistency and remember:  Eat only at your chosen meal times.  Minimize drinking with meals. You should drink 30 minutes before eating. Do not start drinking again for about 2 hours after eating.  Chew food well. Take small bites.  Think about the portion size of a healthy frozen meal. You will be able to eat most of this.  Make sure your meal is balanced with starch, protein, fruits, and vegetables.  When you feel full, stop eating. Document Released: 07/02/2002 Document Revised: 03/20/2011 Document Reviewed: 03/25/2010 [facility] Patient Information 2014 [place], [place].

## 2013-04-05 NOTE — Progress Notes (Signed)
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  Allergies  Allergen Reactions  . Nsaids     Anemia, and Gastric bypass  . Promethazine Hcl Other (See Comments)    Agitated, restless  . Tramadol Rash    Patient Measurements: Height: 5\' 4"  (162.6 cm) Weight: 95 lb 11.2 oz (43.409 kg) IBW/kg (Calculated) : 54.7  Vital Signs: Temp: 97.9 F (36.6 C) (03/28 0600) Temp src: Oral (03/28 0600) BP: 103/64 mmHg (03/28 0600) Pulse Rate: 74 (03/28 0600)  Intake/Output from previous day: 03/27 0701 - 03/28 0700 In: 2760 [I.V.:1560; TPN:1200] Out: 1500 [Urine:1500]  Labs: No results found for this basename: WBC, HGB, HCT, PLT, APTT, INR,  in the last 72 hours  Recent Labs  04/03/13 0325 04/04/13 0520 04/05/13 0524  NA 134* 137 140  K 3.9 3.9 3.8  CL 98 101 101  CO2 25 27 27   GLUCOSE 112* 100* 94  BUN 12 11 16   CREATININE 0.67 0.53 0.56  CALCIUM 9.1 8.5 9.1  MG 1.9 1.9 2.0  PHOS 5.2* 4.3 4.6  PROT 7.1  --   --   ALBUMIN 3.6  --   --   AST 18  --   --   ALT 7  --   --   ALKPHOS 79  --   --   BILITOT 0.3  --   --    Estimated Creatinine Clearance: 62.8 ml/min (by C-G formula based on Cr of 0.56).    Recent Labs  04/04/13 2002 04/04/13 2353 04/05/13 0804  GLUCAP 129* 103* 105*    Medications:  Infusions:  . dextrose 5 % and 0.45 % NaCl with KCl 20 mEq/L 30 mL/hr at 04/04/13 1800  . Marland KitchenTPN (CLINIMIX-E) Adult 60 mL/hr at 04/04/13 1719   And  . fat emulsion 250 mL (04/04/13 1719)    Insulin Requirements in the past 24 hours:   CBGs: 103-129  Sensitive SSI q8h started 3/24, none given.  Current Nutrition:   NPO (except for popsicle)  Supplements: resource breeze TID  Clinimix 5/15 at 60 mL/hr + Fat Emulsion 20% at 10 mL/hr  Maintenance IVF: D-5-1/2 NS + KCl 9mEq/L at 300 mL/hr  Nutritional Goals:   Per RD assessment 3/23: Kcal: 1400-1600 /day,  Protein: 65-80g / day  Goal Rate:  Clinimix 5/15 at 60 mL/hr + IVFE 20% at 10 mL/hr to deliver 72 grams protein/d, 1502 KCal/d    Assessment: 43 y/o F s/p RYGB in 2007 presented for upper endoscopy to evaluate persistent nausea, vomiting, and abdominal pain.  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  Orders received to begin TPN with pharmacy assistance.  Plan is for TPN x 7 days and then revision of GJ next Tuesday   3/28: Day #6 TPN.  She remains NPO except ice chips and popsicles, but is taking resource breeze TID.  No N/V reported.  Plan is for revision of [place] on 3/31.  She was started on IV antibiotics for dental abscess 3/26.  Labs are stable today with no evidence of refeeding syndrome.   Renal function: SCr stable, CrCl ~ 63 ml/min  Hepatic function: WNL (3/26)  Electrolytes: All elytes WNL.  Phos w slight increase 4.3 > 4.6 after adding lytes back to TNA  Pre-Albumin: 16.3 (3/23)  TG: 48 (3/24)  Glucose: CBGs < 150, no insulin required.  TPN Access: PICC (placed 3/23)    Plan:  At 1800 today:  Continue Clinimix E 5/15 at 60 ml/hr - Resumed with ELECTROLYTES on 3/26  20% fat emulsion at 10 ml/hr.  TNA to contain standard multivitamins daily and trace elements only MWF (due to national supply shortage)  Continue IVF at 30 ml/hr per MD  TNA lab panels on Mondays & Thursdays.  BMET, Phos tomorrow.  Thank you for the consult.  Johny Drilling, PharmD, BCPS Pager: [phone] Pharmacy: [phone] 04/05/2013 10:15 AM

## 2013-04-06 LAB — BASIC METABOLIC PANEL
BUN: 14 mg/dL (ref 6–23)
CALCIUM: 8.6 mg/dL (ref 8.4–10.5)
CO2: 30 mEq/L (ref 19–32)
CREATININE: 0.53 mg/dL (ref 0.50–1.10)
Chloride: 103 mEq/L (ref 96–112)
GFR calc Af Amer: >90 mL/min (ref >90)
GFR calc non Af Amer: >90 mL/min (ref >90)
GLUCOSE: 104 mg/dL — AB (ref 70–99)
Potassium: 4 mEq/L (ref 3.7–5.3)
Sodium: 138 mEq/L (ref 137–147)

## 2013-04-06 LAB — GLUCOSE, CAPILLARY
GLUCOSE-CAPILLARY: 93 mg/dL (ref 70–99)
Glucose-Capillary: 148 mg/dL — ABNORMAL HIGH (ref 70–99)
Glucose-Capillary: 85 mg/dL (ref 70–99)

## 2013-04-06 LAB — PHOSPHORUS: Phosphorus: 4.6 mg/dL (ref 2.3–4.6)

## 2013-04-06 MED ORDER — M.V.I. ADULT IV INJ
INTRAVENOUS | Status: AC
  Administered 2013-04-06: 18:00:00 via INTRAVENOUS
  Filled 2013-04-06: qty 2000

## 2013-04-06 MED ORDER — HYDROCODONE-ACETAMINOPHEN 7.5-325 MG/15ML PO SOLN
5.0000 mL | ORAL | Status: DC | PRN
  Administered 2013-04-06 – 2013-04-11 (×10): 15 mL via ORAL
  Filled 2013-04-06 (×11): qty 15

## 2013-04-06 MED ORDER — PANTOPRAZOLE SODIUM 40 MG PO TBEC
40.0000 mg | DELAYED_RELEASE_TABLET | Freq: Two times a day (BID) | ORAL | Status: DC
  Administered 2013-04-06 – 2013-04-10 (×8): 40 mg via ORAL
  Filled 2013-04-06 (×13): qty 1

## 2013-04-06 MED ORDER — FAT EMULSION 20 % IV EMUL
250.0000 mL | INTRAVENOUS | Status: AC
  Administered 2013-04-06: 250 mL via INTRAVENOUS
  Filled 2013-04-06: qty 250

## 2013-04-06 NOTE — Progress Notes (Signed)
6 Days Post-Op  Subjective: Minimal abdominal pain today Head ache  Objective: Vital signs in last 24 hours: Temp:  [97.7 F (36.5 C)-98.4 F (36.9 C)] 98.3 F (36.8 C) (03/29 0529) Pulse Rate:  [72-93] 72 (03/29 0529) Resp:  [16-18] 16 (03/29 0529) BP: (109-117)/(63-75) 109/63 mmHg (03/29 0529) SpO2:  [99 %-100 %] 99 % (03/29 0529) Weight:  [95 lb 10.5 oz (43.39 kg)] 95 lb 10.5 oz (43.39 kg) (03/29 0529) Last BM Date: 03/30/13  Intake/Output from previous day: 03/28 0701 - 03/29 0700 In: 3427 [I.V.:720; IV Piggyback:1000; TPN:1707] Out: 250 [Urine:250] Intake/Output this shift:    Abdomen soft, non tender  Lab Results:  No results found for this basename: WBC, HGB, HCT, PLT,  in the last 72 hours BMET  Recent Labs  04/05/13 0524 04/06/13 0435  NA 140 138  K 3.8 4.0  CL 101 103  CO2 27 30  GLUCOSE 94 104*  BUN 16 14  CREATININE 0.56 0.53  CALCIUM 9.1 8.6   PT/INR No results found for this basename: LABPROT, INR,  in the last 72 hours ABG No results found for this basename: PHART, PCO2, PO2, HCO3,  in the last 72 hours  Studies/Results: Dg Shoulder Right  04/04/2013   CLINICAL DATA:  Pain.  EXAM: RIGHT SHOULDER - 2+ VIEW  COMPARISON:  None.  FINDINGS: Right PICC line noted. No acute bony or joint abnormality. No evidence of fracture, dislocation, or separation. Mild soft tissue swelling over the shoulder cannot be excluded. Clinical correlation suggested.  IMPRESSION: 1. PICC line noted. 2. No acute bony or joint abnormality. 3. Mild soft tissue swelling over the shoulder cannot be excluded, clinical correlation suggested.   Electronically Signed   By: Marcello Moores  Register   On: 04/04/2013 16:02    Anti-infectives: Anti-infectives   Start     Dose/Rate Route Frequency Ordered Stop   04/03/13 1400  Ampicillin-Sulbactam (UNASYN) 3 g in sodium chloride 0.9 % 100 mL IVPB     3 g 100 mL/hr over 60 Minutes Intravenous Every 6 hours 04/03/13 1339     04/03/13 1000   amoxicillin-clavulanate (AUGMENTIN) 875-125 MG per tablet 1 tablet  Status:  Discontinued     1 tablet Oral Every 12 hours 04/03/13 0851 04/03/13 1335      Assessment/Plan: s/p Procedure(s): ESOPHAGOGASTRODUODENOSCOPY (EGD) (N/A)  Marginal ulcer and erosion of ring from bypass, dental abscess  Continuing TNA to maximize nutrition For surgery tuesday  LOS: 7 days    Andrell Tallman A 04/06/2013

## 2013-04-06 NOTE — Progress Notes (Signed)
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  Allergies  Allergen Reactions  . Nsaids     Anemia, and Gastric bypass  . Promethazine Hcl Other (See Comments)    Agitated, restless  . Tramadol Rash   Patient Measurements: Height: 5\' 4"  (162.6 cm) Weight: 95 lb 10.5 oz (43.39 kg) IBW/kg (Calculated) : 54.7  Vital Signs: Temp: 98.3 F (36.8 C) (03/29 0529) Temp src: Oral (03/29 0529) BP: 109/63 mmHg (03/29 0529) Pulse Rate: 72 (03/29 0529)  Intake/Output from previous day: 03/28 0701 - 03/29 0700 In: 3427 [I.V.:720; IV Piggyback:1000; MGQ:6761] Out: 250 [Urine:250]  Labs: No results found for this basename: WBC, HGB, HCT, PLT, APTT, INR,  in the last 72 hours  Recent Labs  04/04/13 0520 04/05/13 0524 04/06/13 0435  NA 137 140 138  K 3.9 3.8 4.0  CL 101 101 103  CO2 27 27 30   GLUCOSE 100* 94 104*  BUN 11 16 14   CREATININE 0.53 0.56 0.53  CALCIUM 8.5 9.1 8.6  MG 1.9 2.0  --   PHOS 4.3 4.6 4.6   Estimated Creatinine Clearance: 62.8 ml/min (by C-G formula based on Cr of 0.53).   Recent Labs  04/05/13 1611 04/05/13 2338 04/06/13 0755  GLUCAP 110* 99 148*   Medications:  Infusions:  . dextrose 5 % and 0.45 % NaCl with KCl 20 mEq/L 30 mL/hr at 04/04/13 1800  . Marland KitchenTPN (CLINIMIX-E) Adult 60 mL/hr at 04/05/13 1705   And  . fat emulsion 250 mL (04/05/13 1705)   Insulin Requirements in the past 24 hours:   CBGs: 105, 99,148  Sensitive SSI q8h started 3/24, none given.  Current Nutrition:   NPO from 3/24 (except for popsicle)  Supplements: resource breeze TID-patient refusing  Clinimix 5/15 at 60 mL/hr + Fat Emulsion 20% at 10 mL/hr  Maintenance IVF: D-5-1/2 NS + KCl 19mEq/L at 30 mL/hr  Nutritional Goals:   Per RD assessment 3/23: Kcal: 1400-1600 /day, Protein: 65-80g / day  Goal Rate:  Clinimix 5/15 at 60 mL/hr + IVFE 20% at 10 mL/hr to deliver 72  grams protein/d, 1502 KCal/d   Assessment: 43 y/o F s/p R-n-YGB in 2007 presented for upper endoscopy to evaluate persistent nausea, vomiting, and abdominal pain.  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  Orders received to begin TPN with pharmacy assistance.  Plan is for TPN x 7 days and then revision of GJ next Tuesday. Urine output measurement not complete  3/29: Day #7 TPN.  She remains NPO except ice chips and popsicles, refusing resource breeze TID.  No N/V reported. Plan is for revision of [place] on 3/31.  She was started on IV antibiotics for dental abscess 3/26.     Renal function: SCr stable, CrCl ~ 63 ml/min  Hepatic function: WNL (3/26)  Electrolytes: All WNL.    Pre-Albumin: 16.3 (3/23)  TG: 48 (3/24)  Glucose: CBGs < 150, no insulin required.  TPN Access: PICC (placed 3/23)   Plan:  At 1800 today:  Continue Clinimix E 5/15 at 60 ml/hr - Resumed with ELECTROLYTES on 3/26  20% fat emulsion at 10 ml/hr.  TNA to contain standard multivitamins daily and trace elements only MWF (due to national supply shortage)  Continue IVF at 30 ml/hr per MD  TNA lab panels on Mondays & Thursdays.  Thank you for the consult.  Minda Ditto PharmD Pager [phone] 04/06/2013, 8:20 AM

## 2013-04-06 NOTE — Progress Notes (Signed)
ANTIBIOTIC CONSULT NOTE - follow up  Pharmacy Consult for  Unasyn  Indication: Dental abscess  Allergies  Allergen Reactions  . Nsaids     Anemia, and Gastric bypass  . Promethazine Hcl Other (See Comments)    Agitated, restless  . Tramadol Rash   Patient Measurements: Height: 5\' 4"  (162.6 cm) Weight: 95 lb 10.5 oz (43.39 kg) IBW/kg (Calculated) : 54.7  Vital Signs: Temp: 98.3 F (36.8 C) (03/29 0529) Temp src: Oral (03/29 0529) BP: 109/63 mmHg (03/29 0529) Pulse Rate: 72 (03/29 0529) Intake/Output from previous day: 03/28 0701 - 03/29 0700 In: 3427 [I.V.:720; IV Piggyback:1000; FBP:1025] Out: 250 [Urine:250] Intake/Output from this shift:    Labs:  Recent Labs  04/04/13 0520 04/05/13 0524 04/06/13 0435  CREATININE 0.53 0.56 0.53   Estimated Creatinine Clearance: 62.8 ml/min (by C-G formula based on Cr of 0.53). No results found for this basename: VANCOTROUGH, Corlis Leak, VANCORANDOM, [place], GENTPEAK, GENTRANDOM, TOBRATROUGH, TOBRAPEAK, TOBRARND, AMIKACINPEAK, AMIKACINTROU, AMIKACIN,  in the last 72 hours   Microbiology: Recent Results (from the past 720 hour(s))  MRSA PCR SCREENING     Status: None   Collection Time    03/30/13  9:55 PM      Result Value Ref Range Status   MRSA by PCR NEGATIVE  NEGATIVE Final   Comment:            The GeneXpert MRSA Assay (FDA     approved for NASAL specimens     only), is one component of a     comprehensive MRSA colonization     surveillance program. It is not     intended to diagnose MRSA     infection nor to guide or     monitor treatment for     MRSA infections.   Medications:  Scheduled:  . amphetamine-dextroamphetamine  30 mg Oral BID  . ampicillin-sulbactam (UNASYN) IV  3 g Intravenous Q6H  . enoxaparin (LOVENOX) injection  30 mg Subcutaneous Q24H  . famotidine (PEPCID) IV  20 mg Intravenous Q12H  . feeding supplement (RESOURCE BREEZE)  1 Container Oral TID BM  . insulin aspart  0-9 Units Subcutaneous  Q8H  . nicotine  14 mg Transdermal Daily  . pantoprazole sodium  40 mg Per Tube BID  . sertraline  50 mg Oral Daily  . sucralfate  1 g Oral TID WC & HS  . thiamine IV  100 mg Intravenous Daily   Assessment: 43 yo F with fractured tooth with an associated abscess, She was started on augmentin but was unable to tolerate.  Pharmacy asked to dose Unasyn.   SCr remains WNL    WBC WNL  Plan revision GJ junction 3/31  Goal of Therapy:  Unasyn per renal function   Plan:  1.) Unasyn 3 grams IV q6h 2.) Continue to monitor renal function  3.) Plan root canal 7-14 days post-op  Minda Ditto PharmD Pager #: [phone] 8:34 AM 04/06/2013

## 2013-04-07 ENCOUNTER — Inpatient Hospital Stay (HOSPITAL_COMMUNITY): Payer: PRIVATE HEALTH INSURANCE

## 2013-04-07 LAB — DIFFERENTIAL
BASOS PCT: 1 % (ref 0–1)
Basophils Absolute: 0.1 10*3/uL (ref 0.0–0.1)
EOS ABS: 0.3 10*3/uL (ref 0.0–0.7)
EOS PCT: 6 % — AB (ref 0–5)
Lymphocytes Relative: 30 % (ref 12–46)
Lymphs Abs: 1.4 10*3/uL (ref 0.7–4.0)
MONOS PCT: 7 % (ref 3–12)
Monocytes Absolute: 0.3 10*3/uL (ref 0.1–1.0)
NEUTROS PCT: 55 % (ref 43–77)
Neutro Abs: 2.5 10*3/uL (ref 1.7–7.7)

## 2013-04-07 LAB — COMPREHENSIVE METABOLIC PANEL
ALK PHOS: 73 U/L (ref 39–117)
ALT: 9 U/L (ref 0–35)
AST: 19 U/L (ref 0–37)
Albumin: 3.1 g/dL — ABNORMAL LOW (ref 3.5–5.2)
BUN: 14 mg/dL (ref 6–23)
CHLORIDE: 101 meq/L (ref 96–112)
CO2: 28 mEq/L (ref 19–32)
Calcium: 8.7 mg/dL (ref 8.4–10.5)
Creatinine, Ser: 0.52 mg/dL (ref 0.50–1.10)
GFR calc non Af Amer: >90 mL/min (ref >90)
Glucose, Bld: 95 mg/dL (ref 70–99)
POTASSIUM: 4 meq/L (ref 3.7–5.3)
Sodium: 138 mEq/L (ref 137–147)
Total Protein: 6.4 g/dL (ref 6.0–8.3)

## 2013-04-07 LAB — CBC
HCT: 28.7 % — ABNORMAL LOW (ref 36.0–46.0)
HEMOGLOBIN: 8.6 g/dL — AB (ref 12.0–15.0)
MCH: 24.2 pg — AB (ref 26.0–34.0)
MCHC: 30 g/dL (ref 30.0–36.0)
MCV: 80.6 fL (ref 78.0–100.0)
Platelets: 176 10*3/uL (ref 150–400)
RBC: 3.56 MIL/uL — ABNORMAL LOW (ref 3.87–5.11)
RDW: 15.7 % — ABNORMAL HIGH (ref 11.5–15.5)
WBC: 4.6 10*3/uL (ref 4.0–10.5)

## 2013-04-07 LAB — MAGNESIUM: Magnesium: 2 mg/dL (ref 1.5–2.5)

## 2013-04-07 LAB — ABO/RH: ABO/RH(D): O POS

## 2013-04-07 LAB — TYPE AND SCREEN
ABO/RH(D): O POS
ANTIBODY SCREEN: NEGATIVE

## 2013-04-07 LAB — GLUCOSE, CAPILLARY
GLUCOSE-CAPILLARY: 119 mg/dL — AB (ref 70–99)
GLUCOSE-CAPILLARY: 101 mg/dL — AB (ref 70–99)

## 2013-04-07 LAB — TRIGLYCERIDES: Triglycerides: 101 mg/dL (ref <150)

## 2013-04-07 LAB — PREALBUMIN: PREALBUMIN: 14.1 mg/dL — AB (ref 17.0–34.0)

## 2013-04-07 LAB — PROTIME-INR
INR: 1.04 (ref 0.00–1.49)
Prothrombin Time: 13.4 seconds (ref 11.6–15.2)

## 2013-04-07 LAB — APTT: APTT: 30 s (ref 24–37)

## 2013-04-07 LAB — PHOSPHORUS: Phosphorus: 4.4 mg/dL (ref 2.3–4.6)

## 2013-04-07 MED ORDER — FAT EMULSION 20 % IV EMUL
250.0000 mL | INTRAVENOUS | Status: AC
  Administered 2013-04-07: 250 mL via INTRAVENOUS
  Filled 2013-04-07: qty 250

## 2013-04-07 MED ORDER — TRACE MINERALS CR-CU-F-FE-I-MN-MO-SE-ZN IV SOLN
INTRAVENOUS | Status: AC
  Administered 2013-04-07: 17:00:00 via INTRAVENOUS
  Filled 2013-04-07: qty 2000

## 2013-04-07 NOTE — Progress Notes (Signed)
PT Cancellation Note  Patient Details Name: Katherine [name] MRN: [id number] DOB: 07-09-1970   Cancelled Treatment:    Reason Eval/Treat Not Completed: PT screened, no needs identified, will sign off (pt off unit at tis time; I amb off unit per NT)   [facility] 04/07/2013, 11:03 AM

## 2013-04-07 NOTE — Progress Notes (Signed)
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  Allergies  Allergen Reactions  . Nsaids     Anemia, and Gastric bypass  . Promethazine Hcl Other (See Comments)    Agitated, restless  . Tramadol Rash   Patient Measurements: Height: 5\' 4"  (162.6 cm) Weight: 96 lb 6.4 oz (43.727 kg) IBW/kg (Calculated) : 54.7  Vital Signs: Temp: 97.9 F (36.6 C) (03/30 0530) Temp src: Oral (03/30 0530) BP: 102/66 mmHg (03/30 0530) Pulse Rate: 84 (03/30 0530)  Intake/Output from previous day: 03/29 0701 - 03/30 0700 In: 3404 [P.O.:540; I.V.:720; IV Piggyback:500; QQV:9563] Out: 1100 [Urine:1100]  Labs:  Recent Labs  04/07/13 0430  WBC 4.6  HGB 8.6*  HCT 28.7*  PLT 176    Recent Labs  04/05/13 0524 04/06/13 0435 04/07/13 0430  NA 140 138 138  K 3.8 4.0 4.0  CL 101 103 101  CO2 27 30 28   GLUCOSE 94 104* 95  BUN 16 14 14   CREATININE 0.56 0.53 0.52  CALCIUM 9.1 8.6 8.7  MG 2.0  --  2.0  PHOS 4.6 4.6 4.4  PROT  --   --  6.4  ALBUMIN  --   --  3.1*  AST  --   --  19  ALT  --   --  9  ALKPHOS  --   --  73  BILITOT  --   --  <0.2*  TRIG  --   --  101  Corrected Ca = 9.4 (3/30) Estimated Creatinine Clearance: 63.2 ml/min (by C-G formula based on Cr of 0.52).   Recent Labs  04/06/13 0755 04/06/13 1545 04/06/13 2347  GLUCAP 148* 85 93   Medications:  Infusions:  . dextrose 5 % and 0.45 % NaCl with KCl 20 mEq/L 30 mL/hr (04/06/13 1254)  . Marland KitchenTPN (CLINIMIX-E) Adult 60 mL/hr at 04/06/13 1751   And  . fat emulsion 250 mL (04/06/13 1751)   Insulin Requirements in the past 24 hours:   CBGs: 85 - 148  1 unit Novolog SSI (on q8h sensitive scale)  Current Nutrition:   NPO since 3/24   Clinimix-E 5/15 at 60 mL/hr + Fat Emulsion 20% at 10 mL/hr  Maintenance IVF: D-5-1/2 NS + KCl 53mEq/L at 30 mL/hr  Nutritional Goals:   Per RD assessment 3/23: Kcal: 1400-1600 /day,  Protein: 65-80g / day  Goal Rate:  Clinimix-E 5/15 at 60 mL/hr + IVFE 20% at 10 mL/hr to deliver 72 grams protein/d, 1502 KCal/d   Assessment: 43 y/o F s/p RYGB in 2007 presented for upper endoscopy to evaluate persistent nausea, vomiting, and abdominal pain.  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  Orders received to begin TPN with pharmacy assistance.  Plan is for TPN x 7 days and then revision of GJ on Tuesday 3/31.  3/30: Day #8 TPN.  Remains NPO.  Plan is for revision of [place] on 3/31.  Was started on IV antibiotics for dental abscess 3/26.     Renal function: SCr and BUN WNL, stable  Hepatic function: all LFTs below ULN  Electrolytes: all WNL.    Pre-Albumin: 16.3 (WNL) (3/23); today's value pending  TG: WNL  Glucose: CBGs < 150 on minimal sliding scale insulin coverage  TPN Access: PICC (placed 3/23)   Plan:    Continue Clinimix E 5/15 at 60 ml/hr (goal rate).  Continue 20% fat emulsion at 10 ml/hr.  TNA to contain standard multivitamins daily, but trace elements MWF only (due to national supply shortage)  Continue maintenance IVF at 30 ml/hr per MD  TNA lab panels on Mondays & Thursdays.  Await further word on plans for surgery 3/31.  Clayburn Pert, PharmD, BCPS Pager: [phone] 04/07/2013  7:12 AM

## 2013-04-07 NOTE — Progress Notes (Signed)
NUTRITION FOLLOW UP  Intervention:   - TPN per pharmacy - Diet advancement per MD - Will continue to monitor   Nutrition Dx:   Inadequate oral intake related to clear liquid diet as evidenced by diet order - ongoing now related to inability to eat as evidenced by NPO   Goal:   TPN to meet >90% of estimated nutritional needs - met   Monitor:   Weights, labs, TPN, diet advancement  Assessment:   Pt presents to the Emergency Department complaining of unexpected weight loss of 14 pounds x 2 weeks. She reports losing 3 pounds in the last 3 days, and 24 pounds in the last 4 months. Pt also reports ongoing abdominal pain, nausea, hick ups (onset 1 week), dizziness, abdominal fullness, and vomiting (about 10 episodes daily). She states she is unable to keep anything down including fluids. After eating, she says the undigested food comes back up after 10-90 minutes. Pt states she had been taking Prilosec 40 mg daily, but recently switched to Maalox (1 packet daily) 3-4 days ago. Pt has been taking 4 mg of Zofran every 6 hours daily to help with vomiting. Last small BM today, prior to today pt had a BM about 2 weeks ago. She is urinating about 1-2 times a day consisting of dark yellow urine x 6 days. Hx of gastric bypass in 2007. She said that her initial weight was 232 pounds. Her weight was stable in the 130's until her divorce a couple of years ago, then her weight went to the 110's pounds. Endoscopy today revealed stenosis of gastrojejunal anastomosis no more than 10m with marginal ulcer and erosion of silastic ring into adjacent area. Orders received to begin TPN.   3/23 - Met with pt who reports nausea controlled today by Zofran and denied any nausea. States she couldn't keep down any foods/liquids for the past 2 weeks. Reports before then she would graze on a variety of foods. States her usual weight is 107 pounds but her goal is 120 pounds. Pt requested to rest however observed pt with severe  muscle wasting and subcutaneous fat loss in orbital and temporal region, clavicles, upper arms, and hands. Per discussion with RN, surgeon may place J tube for TF later on in the week.  3/30 - Dentist following for left facial swelling, dental caries, periodontitis, and missing teeth. Plan is for GJ stenosis revision tomorrow. Attempted to meet with pt, however SLP working with pt.   TPN: Clinimix-E 5/15 at 60 mL/hr + fat emulsion 20% at 10 mL/hr to deliver 72 grams protein/d, 1502 KCal/d - meets 100% of estimated nutritional needs  PALB low CBGs < 150 mg/dL Triglycerides WNL Potassium, magnesium, and phosphorus WNL   Height: Ht Readings from Last 1 Encounters:  04/01/13 _0  (1.626 m)    Weight Status:   Wt Readings from Last 1 Encounters:  04/07/13 96 lb 6.4 oz (43.727 kg)  Admit wt:        95 lb 14.4 oz (43.5 kg)  Estimated needs:  Kcal: 1400-1600  Protein: 65-80g  Fluid: 1.4-1.6L/day   Skin: Intact   Diet Order: NPO   Intake/Output Summary (Last 24 hours) at 04/07/13 1254 Last data filed at 04/07/13 1030  Gross per 24 hour  Intake   2904 ml  Output   1050 ml  Net   1854 ml    Last BM: 3/22   Labs:   Recent Labs Lab 04/04/13 0520 04/05/13 0524 04/06/13 0435 04/07/13 0430  NA 137 140 138 138  K 3.9 3.8 4.0 4.0  CL 101 101 103 101  CO2 _0 BUN _1 CREATININE 0.53 0.56 0.53 0.52  CALCIUM 8.5 9.1 8.6 8.7  MG 1.9 2.0  --  2.0  PHOS 4.3 4.6 4.6 4.4  GLUCOSE 100* 94 104* 95    CBG (last 3)   Recent Labs  04/06/13 1545 04/06/13 2347 04/07/13 0718  GLUCAP 85 93 119*    Scheduled Meds: . amphetamine-dextroamphetamine  30 mg Oral BID  . ampicillin-sulbactam (UNASYN) IV  3 g Intravenous Q6H  . enoxaparin (LOVENOX) injection  30 mg Subcutaneous Q24H  . famotidine (PEPCID) IV  20 mg Intravenous Q12H  . insulin aspart  0-9 Units Subcutaneous Q8H  . nicotine  14 mg Transdermal Daily  . pantoprazole  40 mg Oral BID  . sertraline   50 mg Oral Daily  . sucralfate  1 g Oral TID WC & HS  . thiamine IV  100 mg Intravenous Daily    Continuous Infusions: . dextrose 5 % and 0.45 % NaCl with KCl 20 mEq/L 30 mL/hr (04/06/13 1254)  . Marland KitchenTPN (CLINIMIX-E) Adult 60 mL/hr at 04/06/13 1751   And  . fat emulsion 250 mL (04/06/13 1751)  . Marland KitchenTPN (CLINIMIX-E) Adult     And  . fat emulsion      Mikey College MS, RD, LDN [phone] Pager [phone] After Hours Pager

## 2013-04-07 NOTE — Care Management Note (Signed)
CM spoke with patient at the bedside concerning Cm consult for community Dental Resources. Pt provided information concerning dentist whom accept self payments. Per pt unsure if dental insurance becomes effective in July. Per pt would like to consult Lenn Cal, DDS, whom examined pt during course of hospital stay. CM to continue to follow.   Venita Lick [place] Cloe,MSN,RN [phone]

## 2013-04-07 NOTE — Progress Notes (Signed)
I have personally interviewed and examined this patient. I have reviewed Dr. Pollie Friar endoscopy report. I have discussed her care with Dr. Lucia Gaskins. Hemoglobin 8.6.  I told her that she must not eat or drink anything after midnight tonight.  Dr.  Lucia Gaskins plans to come and discuss the proposed revisional surgery with her this afternoon. Tentative plan is to proceed with the surgery tomorrow. Continue TNA.    Edsel Petrin. Dalbert Batman, M.D., [facility] General and Minimally invasive Surgery Breast and Colorectal Surgery Office:   [phone] Pager:   [phone]

## 2013-04-07 NOTE — Progress Notes (Signed)
OT Cancellation Note  Patient Details Name: Katherine [name] MRN: [id number] DOB: Aug 30, 1970   Cancelled Treatment:    Reason Eval/Treat Not Completed: OT screened, no needs identified, will sign off Pt does not have any OT needs. Pt did complain of R shoulder pain- but not ROM deficits noted.  Betsy Pries 04/07/2013, 1:24 PM

## 2013-04-07 NOTE — Progress Notes (Addendum)
7 Days Post-Op  Subjective: No real change, mouth still sore, no new complaints.  Objective: Vital signs in last 24 hours: Temp:  [97.9 F (36.6 C)-98.1 F (36.7 C)] 97.9 F (36.6 C) (03/30 0530) Pulse Rate:  [74-99] 84 (03/30 0530) Resp:  [16-18] 16 (03/30 0530) BP: (102-111)/(66-74) 102/66 mmHg (03/30 0530) SpO2:  [93 %-99 %] 99 % (03/30 0530) Weight:  [43.727 kg (96 lb 6.4 oz)] 43.727 kg (96 lb 6.4 oz) (03/30 0530) Last BM Date: 03/30/13 540 PO recorded. Diet:  Some ice chips and sips TNA Afebrile, VSS She has had 4 days of Unasyn to cover dental infection, CMP OK, prealbumin is pending. H/h is down some No acute finding s on right shoulder films from Friday Intake/Output from previous day: 03/29 0701 - 03/30 0700 In: 3404 [P.O.:540; I.V.:720; IV Piggyback:500; TPN:1644] Out: 1100 [Urine:1100] Intake/Output this shift:    General appearance: alert, cooperative and no distress GI: soft, non-tender; bowel sounds normal; no masses,  no organomegaly  Lab Results:   Recent Labs  04/07/13 0430  WBC 4.6  HGB 8.6*  HCT 28.7*  PLT 176    BMET  Recent Labs  04/06/13 0435 04/07/13 0430  NA 138 138  K 4.0 4.0  CL 103 101  CO2 30 28  GLUCOSE 104* 95  BUN 14 14  CREATININE 0.53 0.52  CALCIUM 8.6 8.7   PT/INR No results found for this basename: LABPROT, INR,  in the last 72 hours   Recent Labs Lab 04/01/13 0530 04/03/13 0325 04/07/13 0430  AST 16 18 19   ALT 7 7 9   ALKPHOS 63 79 73  BILITOT 0.3 0.3 <0.2*  PROT 6.1 7.1 6.4  ALBUMIN 3.2* 3.6 3.1*     Lipase     Component Value Date/Time   LIPASE 49 03/27/2013 1648     Studies/Results: No results found.  Medications: . amphetamine-dextroamphetamine  30 mg Oral BID  . ampicillin-sulbactam (UNASYN) IV  3 g Intravenous Q6H  . enoxaparin (LOVENOX) injection  30 mg Subcutaneous Q24H  . famotidine (PEPCID) IV  20 mg Intravenous Q12H  . insulin aspart  0-9 Units Subcutaneous Q8H  . nicotine  14 mg  Transdermal Daily  . pantoprazole  40 mg Oral BID  . sertraline  50 mg Oral Daily  . sucralfate  1 g Oral TID WC & HS  . 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   Plan: Dr. Lucia Gaskins is going to do her surgery tomorrow. I am going to type and screen and check coags, CXR, EKG  today also as preop.    LOS: 8 days    JENNINGS,WILLARD 04/07/2013  I spent over 30 minutes with the patient going over the planned surgery tomorrow.  The main plan is to revise the gastro jejunostomy.  Whether I can do this laparoscopically or I will have to do open depends and I explained this to the patient.   She has diagrams of this and understands the surgery and risks.  The major risk is probably leak for the new anastomoisis.  Lesser risks are bleeding, infection, and poor gastric emptying.  I think she understands this surgery and the plan well. Note Hgb - 8.6 - 04/07/2013  Alphonsa Overall, MD, [facility] Pager: [phone] Office phone:  [phone]

## 2013-04-08 ENCOUNTER — Inpatient Hospital Stay (HOSPITAL_COMMUNITY): Payer: PRIVATE HEALTH INSURANCE | Admitting: Anesthesiology

## 2013-04-08 ENCOUNTER — Encounter (HOSPITAL_COMMUNITY): Admission: EM | Disposition: A | Discharge status: Home / Self Care | Payer: Self-pay | Source: Home / Self Care

## 2013-04-08 ENCOUNTER — Encounter (HOSPITAL_COMMUNITY): Payer: PRIVATE HEALTH INSURANCE | Admitting: Anesthesiology

## 2013-04-08 DIAGNOSIS — K253 Acute gastric ulcer without hemorrhage or perforation: Secondary | ICD-10-CM

## 2013-04-08 HISTORY — PX: LAPAROSCOPIC REVISION OF GASTROJEJUNOSTOMY: SHX5922

## 2013-04-08 LAB — GLUCOSE, CAPILLARY
GLUCOSE-CAPILLARY: 177 mg/dL — AB (ref 70–99)
Glucose-Capillary: 98 mg/dL (ref 70–99)
Glucose-Capillary: 92 mg/dL (ref 70–99)
Glucose-Capillary: 230 mg/dL — ABNORMAL HIGH (ref 70–99)

## 2013-04-08 SURGERY — REVISION, ANASTOMOSIS, GASTROJEJUNAL, LAPAROSCOPIC
Anesthesia: General | Site: Abdomen

## 2013-04-08 MED ORDER — FAT EMULSION 20 % IV EMUL
250.0000 mL | INTRAVENOUS | Status: AC
  Administered 2013-04-08: 250 mL via INTRAVENOUS
  Filled 2013-04-08: qty 250

## 2013-04-08 MED ORDER — FENTANYL CITRATE 0.05 MG/ML IJ SOLN
INTRAMUSCULAR | Status: DC | PRN
  Administered 2013-04-08 (×4): 50 ug via INTRAVENOUS

## 2013-04-08 MED ORDER — SODIUM CHLORIDE 0.9 % IV SOLN
3.0000 g | INTRAVENOUS | Status: DC | PRN
  Administered 2013-04-08: 3 g via INTRAVENOUS

## 2013-04-08 MED ORDER — HYDROMORPHONE HCL PF 1 MG/ML IJ SOLN
INTRAMUSCULAR | Status: AC
  Filled 2013-04-08: qty 1

## 2013-04-08 MED ORDER — PROPOFOL 10 MG/ML IV BOLUS
INTRAVENOUS | Status: DC | PRN
  Administered 2013-04-08: 100 mg via INTRAVENOUS

## 2013-04-08 MED ORDER — TISSEEL VH 10 ML EX KIT
PACK | CUTANEOUS | Status: AC
  Filled 2013-04-08: qty 1

## 2013-04-08 MED ORDER — GLYCOPYRROLATE 0.2 MG/ML IJ SOLN
INTRAMUSCULAR | Status: AC
  Filled 2013-04-08: qty 2

## 2013-04-08 MED ORDER — LACTATED RINGERS IV SOLN: INTRAVENOUS | Status: DC

## 2013-04-08 MED ORDER — MIDAZOLAM HCL 2 MG/2ML IJ SOLN
INTRAMUSCULAR | Status: AC
  Filled 2013-04-08: qty 2

## 2013-04-08 MED ORDER — CISATRACURIUM BESYLATE 20 MG/10ML IV SOLN
INTRAVENOUS | Status: AC
  Filled 2013-04-08: qty 10

## 2013-04-08 MED ORDER — KCL IN DEXTROSE-NACL 20-5-0.45 MEQ/L-%-% IV SOLN
INTRAVENOUS | Status: DC
  Administered 2013-04-08 – 2013-04-09 (×2): via INTRAVENOUS
  Administered 2013-04-12: 20 mL/h via INTRAVENOUS
  Filled 2013-04-08 (×5): qty 1000

## 2013-04-08 MED ORDER — DEXAMETHASONE SODIUM PHOSPHATE 10 MG/ML IJ SOLN
INTRAMUSCULAR | Status: AC
  Filled 2013-04-08: qty 1

## 2013-04-08 MED ORDER — LACTATED RINGERS IR SOLN
Status: DC | PRN
  Administered 2013-04-08: 1000 mL

## 2013-04-08 MED ORDER — TISSEEL VH 10 ML EX KIT
PACK | CUTANEOUS | Status: DC | PRN
  Administered 2013-04-08 (×2): 10 mL

## 2013-04-08 MED ORDER — BUPIVACAINE HCL (PF) 0.5 % IJ SOLN
INTRAMUSCULAR | Status: AC
  Filled 2013-04-08: qty 30

## 2013-04-08 MED ORDER — PROPOFOL 10 MG/ML IV BOLUS
INTRAVENOUS | Status: AC
  Filled 2013-04-08: qty 20

## 2013-04-08 MED ORDER — NEOSTIGMINE METHYLSULFATE 1 MG/ML IJ SOLN
INTRAMUSCULAR | Status: DC | PRN
  Administered 2013-04-08: 2.5 mg via INTRAVENOUS

## 2013-04-08 MED ORDER — GLYCOPYRROLATE 0.2 MG/ML IJ SOLN
INTRAMUSCULAR | Status: DC | PRN
  Administered 2013-04-08: 0.4 mg via INTRAVENOUS

## 2013-04-08 MED ORDER — STERILE WATER FOR IRRIGATION IR SOLN
Status: DC | PRN
  Administered 2013-04-08: 1500 mL

## 2013-04-08 MED ORDER — DEXAMETHASONE SODIUM PHOSPHATE 10 MG/ML IJ SOLN
INTRAMUSCULAR | Status: DC | PRN
  Administered 2013-04-08: 10 mg via INTRAVENOUS

## 2013-04-08 MED ORDER — HYDROMORPHONE HCL PF 1 MG/ML IJ SOLN
0.2500 mg | INTRAMUSCULAR | Status: DC | PRN
  Administered 2013-04-08 (×4): 0.5 mg via INTRAVENOUS

## 2013-04-08 MED ORDER — ONDANSETRON HCL 4 MG/2ML IJ SOLN
INTRAMUSCULAR | Status: AC
  Filled 2013-04-08: qty 2

## 2013-04-08 MED ORDER — CISATRACURIUM BESYLATE (PF) 10 MG/5ML IV SOLN
INTRAVENOUS | Status: DC | PRN
  Administered 2013-04-08: 6 mg via INTRAVENOUS
  Administered 2013-04-08 (×2): 2 mg via INTRAVENOUS

## 2013-04-08 MED ORDER — SODIUM CHLORIDE 0.9 % IV SOLN
INTRAVENOUS | Status: AC
  Filled 2013-04-08: qty 3

## 2013-04-08 MED ORDER — HYDROMORPHONE HCL PF 1 MG/ML IJ SOLN
0.5000 mg | INTRAMUSCULAR | Status: DC | PRN
  Administered 2013-04-08 (×4): 1 mg via INTRAVENOUS
  Administered 2013-04-09: 1.5 mg via INTRAVENOUS
  Administered 2013-04-09 (×3): 1 mg via INTRAVENOUS
  Administered 2013-04-09: 1.5 mg via INTRAVENOUS
  Administered 2013-04-09 (×2): 1 mg via INTRAVENOUS
  Administered 2013-04-09: 1.5 mg via INTRAVENOUS
  Administered 2013-04-09 – 2013-04-14 (×39): 1 mg via INTRAVENOUS
  Filled 2013-04-08 (×4): qty 1
  Filled 2013-04-08: qty 2
  Filled 2013-04-08 (×5): qty 1
  Filled 2013-04-08: qty 2
  Filled 2013-04-08 (×22): qty 1
  Filled 2013-04-08: qty 2
  Filled 2013-04-08 (×17): qty 1

## 2013-04-08 MED ORDER — 0.9 % SODIUM CHLORIDE (POUR BTL) OPTIME
TOPICAL | Status: DC | PRN
  Administered 2013-04-08: 1000 mL

## 2013-04-08 MED ORDER — LACTATED RINGERS IV SOLN
INTRAVENOUS | Status: DC
Start: 2013-04-08 — End: 2013-04-08

## 2013-04-08 MED ORDER — HEPARIN SODIUM (PORCINE) 5000 UNIT/ML IJ SOLN
5000.0000 [IU] | Freq: Three times a day (TID) | INTRAMUSCULAR | Status: DC
  Administered 2013-04-08 – 2013-04-14 (×17): 5000 [IU] via SUBCUTANEOUS
  Filled 2013-04-08 (×22): qty 1

## 2013-04-08 MED ORDER — MIDAZOLAM HCL 5 MG/5ML IJ SOLN: INTRAMUSCULAR | Status: DC | PRN

## 2013-04-08 MED ORDER — MIDAZOLAM HCL 5 MG/5ML IJ SOLN
INTRAMUSCULAR | Status: DC | PRN
  Administered 2013-04-08: 2 mg via INTRAVENOUS

## 2013-04-08 MED ORDER — BUPIVACAINE HCL (PF) 0.5 % IJ SOLN
INTRAMUSCULAR | Status: DC | PRN
  Administered 2013-04-08: 30 mL

## 2013-04-08 MED ORDER — NEOSTIGMINE METHYLSULFATE 1 MG/ML IJ SOLN
INTRAMUSCULAR | Status: AC
  Filled 2013-04-08: qty 10

## 2013-04-08 MED ORDER — M.V.I. ADULT IV INJ
INTRAVENOUS | Status: AC
  Administered 2013-04-08: 18:00:00 via INTRAVENOUS
  Filled 2013-04-08: qty 2000

## 2013-04-08 MED ORDER — FENTANYL CITRATE 0.05 MG/ML IJ SOLN
INTRAMUSCULAR | Status: AC
  Filled 2013-04-08: qty 5

## 2013-04-08 MED ORDER — LACTATED RINGERS IV SOLN
INTRAVENOUS | Status: DC | PRN
Start: 2013-04-08 — End: 2013-04-08
  Administered 2013-04-08: 13:00:00 via INTRAVENOUS

## 2013-04-08 MED ORDER — ONDANSETRON HCL 4 MG/2ML IJ SOLN
INTRAMUSCULAR | Status: DC | PRN
  Administered 2013-04-08: 4 mg via INTRAVENOUS

## 2013-04-08 SURGICAL SUPPLY — 71 items
APL SKNCLS STERI-STRIP NONHPOA (GAUZE/BANDAGES/DRESSINGS)
APPLICATOR COTTON TIP 6IN STRL (MISCELLANEOUS) ×2 IMPLANT
BAG SPEC RTRVL LRG 6X4 10 (ENDOMECHANICALS) ×1
BENZOIN TINCTURE PRP APPL 2/3 (GAUZE/BANDAGES/DRESSINGS) IMPLANT
BLADE SURG 15 STRL LF DISP TIS (BLADE) ×1 IMPLANT
BLADE SURG 15 STRL SS (BLADE)
CABLE HIGH FREQUENCY MONO STRZ (ELECTRODE) IMPLANT
CANISTER SUCTION 2500CC (MISCELLANEOUS) ×1 IMPLANT
CLIP SUT LAPRA TY ABSORB (SUTURE) ×2 IMPLANT
CLOSURE WOUND 1/2 X4 (GAUZE/BANDAGES/DRESSINGS)
CORD HIGH FREQUENCY UNIPOLAR (ELECTROSURGICAL) ×3 IMPLANT
COVER SURGICAL LIGHT HANDLE (MISCELLANEOUS) ×3 IMPLANT
CUTTER ECHEON FLEX ENDO 45 340 (ENDOMECHANICALS) ×2 IMPLANT
DEVICE SUT QUICK LOAD TK 5 (STAPLE) ×1 IMPLANT
DEVICE SUT TI-KNOT TK 5X26 (MISCELLANEOUS) ×1 IMPLANT
DEVICE SUTURE ENDOST 10MM (ENDOMECHANICALS) ×2 IMPLANT
DEVICE TI KNOT TK5 (MISCELLANEOUS) ×1
DISSECTOR BLUNT TIP ENDO 5MM (MISCELLANEOUS) ×1 IMPLANT
DRAPE CAMERA CLOSED 9X96 (DRAPES) ×1 IMPLANT
GLOVE BIOGEL M 8.0 STRL (GLOVE) ×1 IMPLANT
GOWN STRL REUS W/TWL LRG LVL3 (GOWN DISPOSABLE) ×3 IMPLANT
GOWN STRL REUS W/TWL XL LVL3 (GOWN DISPOSABLE) ×6 IMPLANT
KIT BASIN OR (CUSTOM PROCEDURE TRAY) ×3 IMPLANT
KIT GASTRIC LAVAGE 34FR ADT (SET/KITS/TRAYS/PACK) ×2 IMPLANT
MARKER SKIN DUAL TIP RULER LAB (MISCELLANEOUS) ×3 IMPLANT
NDL SPNL 22GX3.5 QUINCKE BK (NEEDLE) ×1 IMPLANT
NEEDLE SPNL 22GX3.5 QUINCKE BK (NEEDLE) ×3 IMPLANT
NS IRRIG 1000ML POUR BTL (IV SOLUTION) ×1 IMPLANT
PACK GENERAL/GYN (CUSTOM PROCEDURE TRAY) ×1 IMPLANT
POUCH SPECIMEN RETRIEVAL 10MM (ENDOMECHANICALS) ×2 IMPLANT
QUICK LOAD TK 5 (STAPLE) ×1
RELOAD BLUE (STAPLE) ×4 IMPLANT
RELOAD ENDO STITCH 2.0 (ENDOMECHANICALS) ×9
RELOAD GOLD ECHELON 45 (STAPLE) ×2 IMPLANT
RELOAD GREEN (STAPLE) ×4 IMPLANT
RELOAD STAPLE 45 3.5 BLU ETS (ENDOMECHANICALS) IMPLANT
RELOAD STAPLE TA45 3.5 REG BLU (ENDOMECHANICALS) ×3 IMPLANT
RELOAD SUT SNGL STCH ABSRB 2-0 (ENDOMECHANICALS) IMPLANT
RELOAD SUT SNGL STCH BLK 2-0 (ENDOMECHANICALS) IMPLANT
SCISSORS LAP 5X35 DISP (ENDOMECHANICALS) ×3 IMPLANT
SET IRRIG TUBING LAPAROSCOPIC (IRRIGATION / IRRIGATOR) ×3 IMPLANT
SHEARS HARMONIC ACE PLUS 45CM (MISCELLANEOUS) ×2 IMPLANT
SLEEVE ADV FIXATION 12X100MM (TROCAR) IMPLANT
SLEEVE ADV FIXATION 5X100MM (TROCAR) IMPLANT
SLEEVE Z-THREAD 5X100MM (TROCAR) IMPLANT
SOLUTION ANTI FOG 6CC (MISCELLANEOUS) ×3 IMPLANT
STAPLE ECHEON FLEX 60 POW ENDO (STAPLE) ×2 IMPLANT
STAPLER VISISTAT 35W (STAPLE) ×1 IMPLANT
STRIP CLOSURE SKIN 1/2X4 (GAUZE/BANDAGES/DRESSINGS) IMPLANT
SUT MNCRL AB 4-0 PS2 18 (SUTURE) ×2 IMPLANT
SUT MON AB 5-0 PS2 18 (SUTURE) ×2 IMPLANT
SUT RELOAD ENDO STITCH 2 48X1 (ENDOMECHANICALS) ×3
SUT RELOAD ENDO STITCH 2.0 (ENDOMECHANICALS)
SUT VIC AB 2-0 SH 27 (SUTURE) ×3
SUT VIC AB 2-0 SH 27X BRD (SUTURE) ×1 IMPLANT
SUT VIC AB 4-0 SH 18 (SUTURE) ×1 IMPLANT
SUT VICRYL 0 UR6 27IN ABS (SUTURE) ×2 IMPLANT
SUTURE RELOAD END STTCH 2 48X1 (ENDOMECHANICALS) ×3 IMPLANT
SUTURE RELOAD ENDO STITCH 2.0 (ENDOMECHANICALS) IMPLANT
SYR 30ML LL (SYRINGE) ×3 IMPLANT
TRAY FOLEY CATH 14FRSI W/METER (CATHETERS) ×3 IMPLANT
TRAY LAP CHOLE (CUSTOM PROCEDURE TRAY) ×2 IMPLANT
TROCAR ADV FIXATION 12X100MM (TROCAR) ×2 IMPLANT
TROCAR ADV FIXATION 5X100MM (TROCAR) ×8 IMPLANT
TROCAR BLADELESS OPT 5 100 (ENDOMECHANICALS) ×1 IMPLANT
TROCAR XCEL 12X100 BLDLESS (ENDOMECHANICALS) ×5 IMPLANT
TUBING CONNECTING 10 (TUBING) ×2 IMPLANT
TUBING CONNECTING 10' (TUBING) ×1
TUBING ENDO SMARTCAP (MISCELLANEOUS) ×1 IMPLANT
TUBING INSUFFLATION 10FT LAP (TUBING) ×2 IMPLANT
YANKAUER SUCT BULB TIP NO VENT (SUCTIONS) ×1 IMPLANT

## 2013-04-08 NOTE — Progress Notes (Addendum)
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  Allergies  Allergen Reactions  . Nsaids     Anemia, and Gastric bypass  . Promethazine Hcl Other (See Comments)    Agitated, restless  . Tramadol Rash   Patient Measurements: Height: 5\' 4"  (162.6 cm) Weight: 96 lb 8 oz (43.772 kg) IBW/kg (Calculated) : 54.7  Vital Signs: Temp: 97.9 F (36.6 C) (03/31 0518) Temp src: Oral (03/31 0518) BP: 106/65 mmHg (03/31 0518) Pulse Rate: 74 (03/31 0518)  Intake/Output from previous day: 03/30 0701 - 03/31 0700 In: 1340 [P.O.:60; I.V.:720; TPN:560] Out: 650 [Urine:650]  Labs:  Recent Labs  04/07/13 0430 04/07/13 1035  WBC 4.6  --   HGB 8.6*  --   HCT 28.7*  --   PLT 176  --   APTT  --  30  INR  --  1.04    Recent Labs  04/06/13 0435 04/07/13 0430  NA 138 138  K 4.0 4.0  CL 103 101  CO2 30 28  GLUCOSE 104* 95  BUN 14 14  CREATININE 0.53 0.52  CALCIUM 8.6 8.7  MG  --  2.0  PHOS 4.6 4.4  PROT  --  6.4  ALBUMIN  --  3.1*  AST  --  19  ALT  --  9  ALKPHOS  --  73  BILITOT  --  <0.2*  PREALBUMIN  --  14.1*  TRIG  --  101  Corrected Ca = 9.4 (3/30) Estimated Creatinine Clearance: 63.3 ml/min (by C-G formula based on Cr of 0.52).   Recent Labs  04/07/13 1558 04/08/13 0023 04/08/13 0726  GLUCAP 101* 98 92   Medications:  Infusions:  . dextrose 5 % and 0.45 % NaCl with KCl 20 mEq/L 30 mL/hr at 04/08/13 0600  . Marland KitchenTPN (CLINIMIX-E) Adult 60 mL/hr at 04/07/13 1709   And  . fat emulsion 250 mL (04/07/13 1709)   Insulin Requirements in the past 24 hours:   CBGs: 92-119  0 unit Novolog SSI (on q8h sensitive scale)  Current Nutrition:   NPO since 3/24   Clinimix-E 5/15 at 60 mL/hr + Fat Emulsion 20% at 10 mL/hr  Maintenance IVF: D-5-1/2 NS + KCl 37mEq/L at 30 mL/hr  Nutritional Goals:   Per RD assessment 3/23: Kcal: 1400-1600 /day, Protein: 65-80g /  day  Goal Rate:  Clinimix-E 5/15 at 60 mL/hr + IVFE 20% at 10 mL/hr to deliver 72 grams protein/d, 1502 KCal/d   Assessment: 43 y/o F s/p RYGB in 2007 presented for upper endoscopy to evaluate persistent nausea, vomiting, and abdominal pain.  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  Orders received to begin TPN with pharmacy assistance.  Plan is for TPN x 7 days and then revision of GJ on Tuesday 3/31.  3/31: Day #9 TPN.  Remains NPO.  For OR today for revision GJ and possible gastrectomy.  Was started on IV antibiotics for dental abscess 3/26.     Renal function: SCr and BUN WNL, stable  Hepatic function: all LFTs below ULN  Electrolytes: all WNL.    Pre-Albumin: 16.3 (WNL) (3/23); 14.1 (3/30), down  TG: WNL  Glucose: CBGs < 150 on minimal sliding scale insulin coverage  TPN Access: PICC (placed 3/23)   Plan:    Continue Clinimix E 5/15 at 60 ml/hr (goal rate).  Continue 20%  fat emulsion at 10 ml/hr.  TNA to contain standard multivitamins daily, but trace elements MWF only (due to national supply shortage)  Continue maintenance IVF at 30 ml/hr per MD  TNA lab panels on Mondays & Thursdays.   Adrian Saran, PharmD, BCPS Pager [phone] 04/08/2013 10:07 AM

## 2013-04-08 NOTE — Progress Notes (Signed)
Went in to see the patient before she goes down to the OR.  She is resting well.  She's looking forward to getting her surgery done today.  Her tooth still hurts, but swelling is getting better.  She says she vomited yesterday for the first time in a few days.  She feels okay this am.  NPO.  Will hold pills.  May need ativan prior to OR as she says she feels very overwhelmed and nervous.  She says the Dentist supposed to see here today to talk about a tooth extraction, root canal, and crown.  She is unsure of the timing of this.  Agree with above.  Alphonsa Overall, MD, [facility] Pager: [phone] Office phone:  [phone]

## 2013-04-08 NOTE — Transfer of Care (Signed)
Immediate Anesthesia Transfer of Care Note  Patient: Katherine [name]  Procedure(s) Performed: Procedure(s): LAPAROSCOPIC REVISION OF GASTROJEJUNOSTOMY AND UPPER ENDOSCOPY (N/A)  Patient Location: PACU  Anesthesia Type:General  Level of Consciousness: awake, alert  and oriented  Airway & Oxygen Therapy: Patient Spontanous Breathing and Patient connected to face mask oxygen  Post-op Assessment: Report given to PACU RN and Post -op Vital signs reviewed and stable  Post vital signs: Reviewed and stable  Complications: No apparent anesthesia complications

## 2013-04-08 NOTE — Anesthesia Preprocedure Evaluation (Signed)
Anesthesia Evaluation  Patient identified by MRN, date of birth, ID band Patient awake    Reviewed: Allergy & Precautions, H&P , NPO status , Patient's Chart, lab work & pertinent test results  Airway Mallampati: II TM Distance: >3 FB Neck ROM: full    Dental  (+) Teeth Intact, Dental Advisory Given Has broken  Left lower molar with abscess:   Pulmonary neg pulmonary ROS, Current Smoker,  breath sounds clear to auscultation  Pulmonary exam normal       Cardiovascular Exercise Tolerance: Good negative cardio ROS  Rhythm:regular Rate:Normal     Neuro/Psych Anxiety Panic attacksnegative neurological ROS  negative psych ROS   GI/Hepatic negative GI ROS, Neg liver ROS, Cachexia s/p gastric bypass   Endo/Other  negative endocrine ROS  Renal/GU negative Renal ROS  negative genitourinary   Musculoskeletal   Abdominal   Peds  Hematology negative hematology ROS (+) anemia , hgb 8.6   Anesthesia Other Findings   Reproductive/Obstetrics negative OB ROS                           Anesthesia Physical Anesthesia Plan  ASA: III  Anesthesia Plan: General   Post-op Pain Management:    Induction: Intravenous  Airway Management Planned: Oral ETT  Additional Equipment:   Intra-op Plan:   Post-operative Plan: Extubation in OR  Informed Consent: I have reviewed the patients History and Physical, chart, labs and discussed the procedure including the risks, benefits and alternatives for the proposed anesthesia with the patient or authorized representative who has indicated his/her understanding and acceptance.   Dental Advisory Given  Plan Discussed with: CRNA and Surgeon  Anesthesia Plan Comments:         Anesthesia Quick Evaluation

## 2013-04-08 NOTE — Anesthesia Postprocedure Evaluation (Signed)
  Anesthesia Post-op Note  Patient: Katherine [name]  Procedure(s) Performed: Procedure(s) (LRB): LAPAROSCOPIC REVISION OF GASTROJEJUNOSTOMY AND UPPER ENDOSCOPY (N/A)  Patient Location: PACU  Anesthesia Type: General  Level of Consciousness: awake and alert   Airway and Oxygen Therapy: Patient Spontanous Breathing  Post-op Pain: mild  Post-op Assessment: Post-op Vital signs reviewed, Patient's Cardiovascular Status Stable, Respiratory Function Stable, Patent Airway and No signs of Nausea or vomiting  Last Vitals:  Filed Vitals:   04/08/13 1715  BP: 134/87  Pulse: 71  Temp:   Resp: 15    Post-op Vital Signs: stable   Complications: No apparent anesthesia complications

## 2013-04-09 ENCOUNTER — Inpatient Hospital Stay (HOSPITAL_COMMUNITY): Payer: PRIVATE HEALTH INSURANCE

## 2013-04-09 ENCOUNTER — Encounter (HOSPITAL_COMMUNITY): Payer: Self-pay | Admitting: Surgery

## 2013-04-09 LAB — CBC WITH DIFFERENTIAL/PLATELET
BASOS ABS: 0 10*3/uL (ref 0.0–0.1)
BASOS PCT: 0 % (ref 0–1)
Eosinophils Absolute: 0 10*3/uL (ref 0.0–0.7)
Eosinophils Relative: 0 % (ref 0–5)
HCT: 26.8 % — ABNORMAL LOW (ref 36.0–46.0)
Hemoglobin: 8.4 g/dL — ABNORMAL LOW (ref 12.0–15.0)
Lymphocytes Relative: 14 % (ref 12–46)
Lymphs Abs: 1 10*3/uL (ref 0.7–4.0)
MCH: 24.9 pg — ABNORMAL LOW (ref 26.0–34.0)
MCHC: 31.3 g/dL (ref 30.0–36.0)
MCV: 79.3 fL (ref 78.0–100.0)
MONO ABS: 0.6 10*3/uL (ref 0.1–1.0)
Monocytes Relative: 9 % (ref 3–12)
NEUTROS ABS: 5.3 10*3/uL (ref 1.7–7.7)
NEUTROS PCT: 76 % (ref 43–77)
Platelets: 186 10*3/uL (ref 150–400)
RBC: 3.38 MIL/uL — ABNORMAL LOW (ref 3.87–5.11)
RDW: 15.5 % (ref 11.5–15.5)
WBC: 6.9 10*3/uL (ref 4.0–10.5)

## 2013-04-09 LAB — BASIC METABOLIC PANEL
BUN: 14 mg/dL (ref 6–23)
CO2: 27 mEq/L (ref 19–32)
CREATININE: 0.44 mg/dL — AB (ref 0.50–1.10)
Calcium: 8.4 mg/dL (ref 8.4–10.5)
Chloride: 95 mEq/L — ABNORMAL LOW (ref 96–112)
GFR calc non Af Amer: >90 mL/min (ref >90)
Glucose, Bld: 122 mg/dL — ABNORMAL HIGH (ref 70–99)
POTASSIUM: 3.7 meq/L (ref 3.7–5.3)
Sodium: 132 mEq/L — ABNORMAL LOW (ref 137–147)

## 2013-04-09 LAB — GLUCOSE, CAPILLARY
GLUCOSE-CAPILLARY: 121 mg/dL — AB (ref 70–99)
GLUCOSE-CAPILLARY: 146 mg/dL — AB (ref 70–99)
Glucose-Capillary: 125 mg/dL — ABNORMAL HIGH (ref 70–99)

## 2013-04-09 MED ORDER — TRACE MINERALS CR-CU-F-FE-I-MN-MO-SE-ZN IV SOLN
INTRAVENOUS | Status: AC
  Administered 2013-04-09: 18:00:00 via INTRAVENOUS
  Filled 2013-04-09: qty 2000

## 2013-04-09 MED ORDER — IOHEXOL 300 MG/ML  SOLN
50.0000 mL | Freq: Once | INTRAMUSCULAR | Status: AC | PRN
  Administered 2013-04-09: 30 mL via ORAL

## 2013-04-09 MED ORDER — FAT EMULSION 20 % IV EMUL
250.0000 mL | INTRAVENOUS | Status: AC
  Administered 2013-04-09: 250 mL via INTRAVENOUS
  Filled 2013-04-09: qty 250

## 2013-04-09 NOTE — Op Note (Signed)
NAME:  Katherine [name], Katherine [name]                    ACCOUNT NO.:  MEDICAL RECORD NO.:  [id number]  LOCATION:                                 FACILITY:  PHYSICIAN:  Fenton Malling. Lucia Gaskins, M.D.  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  PROCEDURE:  Laparoscopic revision of gastrojejunostomy with upper endoscopy.  SURGEON:  Fenton Malling. Lucia Gaskins, M.D.  FIRST ASSISTANT:  Marland Kitchen T. Hoxworth, M.D.; Dr. Excell Seltzer did the upper endoscopy.  ANESTHESIA:  General endotracheal supervised Dr. Rod Mae.  ESTIMATED BLOOD LOSS:  Less than 50 mL.  DRAINS:  Left in were none.  Local anesthetic was 30 mL of 0.25% Marcaine.  SPECIMEN:  Gastrojejunal anastomosis and foreign body.  INDICATION FOR PROCEDURE:  Katherine [name] is a 43 year old white female, who sees Dr. Annye Asa at [facility] as a primary care doctor, but I am not sure how close she has been followed.  She had a gastric bypass in [place] by Dr. Timmothy Sours (sp) in 2007, with initial weight of 232.  She did well until several weeks prior to admission.  She had restarted smoking cigarettes about 4 years ago.  Then about 2-3 weeks prior to admission, she had increasing nausea, vomiting, and abdominal discomfort.  She was admitted on March 30, 2013, underwent upper endoscopy, which revealed an obstructing ulcer at the gastrojejunal anastomosis with an erosion of a peri-anastomotic ring into the lumen.  Discussion was carried out with the patient about treatment for this.  We thought she would be best served by placing on TPN and keeping her in the hospital until surgery.  She also developed a abscess of her left lower jaw and she has been placed on Unasyn to  treat this.    The indications and potential risks of surgery were explained to the patient.  Potential risks include, but not limited to, bleeding, infection, and nerve injury.  The biggest risk would be some type of leakage from her anastomosis.  OPERATIVE NOTE:  The patient was taken room #10 at [facility].  She underwent general endotracheal anesthetic.  She was already on Unasyn as antibiotic.    A time-out was held and surgical checklist run.  She had a Foley catheter in place.  Her abdomen was prepped with ChloraPrep and sterilely draped.  She had an NG tube past, which evacuated quite a bit of material out of her stomach remnant.  I first placed an Hassan through an infraumbilical incision and got into her abdominal cavity.  I placed 4 additional trocars; a 5 mm trocar in the left upper quadrant, 5 mm trocar in the left lateral position, and then a 12 mm in the right upper quadrant and 12 mm right paramedian.    Right and left lobes of liver were unremarkable.  The gallbladder was unremarkable.  Her stomach pouch was fairly large.  I could see filmy adhesions on the undersurface of the  stomach.  The stomach pouch itself was thickened, but when decompressed did not look too large.  There was no evidence of actual perforation.  I could see the plastic ring that went around the gastrojejunal anastomosis.  I ran the bowel distally to the jejunojejunostomy where she had an adequate limb length of her Roux limb.  I saw no other adhesions, mass, or nodule within the abdominal cavity.  Then, I turned my attention on mobilizing the gastrojejunostomy.  I went up the stomach about 4-5 cm and freed this off the gastric remnant.  I saw no evidence of a gastrogastric fistula, though I did not take all the retro-pouch adhesions to the excluded stomach, but there was no inflammation or evidence of a entero-anal fistula.  I then took down approximately 5 cm of small bowel using the Harmonic to divide  the small bowel blood vessels.  I used the Gold load of the 60 mm Endo-GIA stapler to divide the distal stomach.  I was able to flip the proximal pouch upside downstream about 7 or 8 cm of small bowel.  I went proximal about 4-5 cm.  I used a 2 green loads to divide the stomach because it was thick.  I then tucked the resected stomach in the right upper quadrant over the liver that I retrieved at the end of the case.  I then did a posterior row of the gastrojejunostomy with a 2-0 Vicryl suture.  I did an enterotomy between the stomach and the small bowel.  I used a 45 mm Ethicon Echelon gold stapler to do the anastomosis and closed the enterotomy with two running 2-0 Vicryl sutures.  I did an anterior running 2-0 Vicryl suture after pushing the Ewald tube through the anastomosis.  The anastomosis looked good.  There was no bleeding.  The bowel looked viable.  The Madaline Brilliant was then removed.  Dr. Excell Seltzer scrubbed and did upper endoscopy.  He identified the gastroesophageal junction about 40 cm.  The patient had a fairly generous pouch.  He got down and he went through the anastomosis at least 2 or 3 cm between the stomach and the new small-bowel anastomosis, it looked healthy.  I flooded the upper abdomen with saline.  There was no bubbling or evidence of leak.  We then sucked air out and removed the endoscope.  I placed a 10 mL of Tisseel over the gastrojejunostomy and over the cut end of the small bowel.  I retrieved the gastrojejunostomy resection and placed in EndoCatch bag and delivered through the umbilicus and retrieved the foreign body attached with it and sent that to Pathology.   I then irrigated the abdomen with a 1 L of saline.  Because she is so thin, I closed her umbilical port with 2-0 Vicryl sutures and closed the 2 right upper quadrant ports with 12 mm trocars with a single 2-0 Vicryl suture, closed the skin each port with either 4-0 or 5-0 Monocryl suture painted with Dermabond.  She  tolerated procedure well, was transported to recovery room in good condition.  Sponge and needle count were correct at the end of the case. Of note, photos were taken and placed in the chart about the resection.  Fenton Malling. Lucia Gaskins, M.D.,  FACS   DHN/MEDQ  D:  04/08/2013  T:  04/09/2013  Job:  [id number]  cc:   Posey Boyer, MD Fax: [phone]  Synetta Shadow, MD

## 2013-04-09 NOTE — Progress Notes (Signed)
General Surgery Note  LOS: 10 days  POD -  1 Day Post-Op  Assessment/Plan: 1.  LAPAROSCOPIC REVISION OF GASTROJEJUNOSTOMY AND UPPER ENDOSCOPY - 04/08/2013 - D. Khloie Hamada  For swallow today.  Other than pain control, she looks good.  2. 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 Timmothy Sours in [place] (was with [facility]), but she said that he has moved.   She has not seen him in at least 5 years.  3. Malnourished.   On TPN 4. ADD - since age 43  5. Anxiety disorder  6. Hgb - 8.4 - on 04/09/2013  7. Smokes cigarettes - approx 1 ppd  Has stopped since admission to hospital 8.  DVT prophylaxis - SQ Heparin 9.  Left lower jaw abscess/inlammation  On Unasyn   Principal Problem:   Gastrojejunal ulcer s/p Lap gastric bypass Active Problems:   Anxiety disorder   Vomiting   Protein-calorie malnutrition, severe   S/P gastric bypass 2007   Tobacco abuse   Cachexia  Subjective:  Doing okay.  No nausea.  Main complaint is pain.  Objective:   Filed Vitals:   04/09/13 0526  BP: 116/78  Pulse: 71  Temp: 98.1 F (36.7 C)  Resp: 16     Intake/Output from previous day:  03/31 0701 - 04/01 0700 In: 2001.7 [I.V.:1721.7; TPN:280] Out: 2355 [Urine:2055]  Intake/Output this shift:      Physical Exam:   General: Thin WF who is alert and oriented.    HEENT: Normal. Pupils equal. .   Lungs: Clear    Abdomen: Soft, has a few BS.   Wound: Look clean   Lab Results:    Recent Labs  04/07/13 0430 04/09/13 0540  WBC 4.6 6.9  HGB 8.6* 8.4*  HCT 28.7* 26.8*  PLT 176 186    BMET   Recent Labs  04/07/13 0430 04/09/13 0540  NA 138 132*  K 4.0 3.7  CL 101 95*  CO2 28 27  GLUCOSE 95 122*  BUN 14 14  CREATININE 0.52 0.44*  CALCIUM 8.7 8.4    PT/INR   Recent Labs  04/07/13 1035  LABPROT 13.4  INR 1.04    ABG  No results found for this basename: PHART, PCO2, PO2, HCO3,  in the last 72 hours   Studies/Results:  Dg Chest 2 View  04/07/2013   CLINICAL DATA:  Left shoulder and  left upper chest pain.  EXAM: CHEST  2 VIEW  COMPARISON:  07/26/2011  FINDINGS: Normal heart, mediastinum and hila.  Clear lungs.  No pleural effusion or pneumothorax.  The bony thorax is unremarkable.  Right PICC has its tip in the lower superior vena cava.  IMPRESSION: No active cardiopulmonary disease.   Electronically Signed   By: Lajean Manes M.D.   On: 04/07/2013 11:49     Anti-infectives:   Anti-infectives   Start     Dose/Rate Route Frequency Ordered Stop   04/03/13 1400  Ampicillin-Sulbactam (UNASYN) 3 g in sodium chloride 0.9 % 100 mL IVPB     3 g 100 mL/hr over 60 Minutes Intravenous Every 6 hours 04/03/13 1339     04/03/13 1000  amoxicillin-clavulanate (AUGMENTIN) 875-125 MG per tablet 1 tablet  Status:  Discontinued     1 tablet Oral Every 12 hours 04/03/13 0851 04/03/13 1335      Alphonsa Overall, MD, FACS Pager: [facility] Office: [phone] 04/09/2013

## 2013-04-09 NOTE — Progress Notes (Signed)
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  Allergies  Allergen Reactions  . Nsaids     Anemia, and Gastric bypass  . Promethazine Hcl Other (See Comments)    Agitated, restless  . Tramadol Rash   Patient Measurements: Height: 5\' 4"  (162.6 cm) Weight: 96 lb 1.6 oz (43.591 kg) IBW/kg (Calculated) : 54.7  Vital Signs: Temp: 98.1 F (36.7 C) (04/01 0526) Temp src: Oral (04/01 0526) BP: 116/78 mmHg (04/01 0526) Pulse Rate: 71 (04/01 0526)  Intake/Output from previous day: 03/31 0701 - 04/01 0700 In: 2001.7 [I.V.:1721.7; TPN:280] Out: 2355 [Urine:2055]  Labs:  Recent Labs  04/07/13 0430 04/07/13 1035 04/09/13 0540  WBC 4.6  --  6.9  HGB 8.6*  --  8.4*  HCT 28.7*  --  26.8*  PLT 176  --  186  APTT  --  30  --   INR  --  1.04  --     Recent Labs  04/07/13 0430 04/09/13 0540  NA 138 132*  K 4.0 3.7  CL 101 95*  CO2 28 27  GLUCOSE 95 122*  BUN 14 14  CREATININE 0.52 0.44*  CALCIUM 8.7 8.4  MG 2.0  --   PHOS 4.4  --   PROT 6.4  --   ALBUMIN 3.1*  --   AST 19  --   ALT 9  --   ALKPHOS 73  --   BILITOT <0.2*  --   PREALBUMIN 14.1*  --   TRIG 101  --   Corrected Ca = 8.7 Estimated Creatinine Clearance: 63.1 ml/min (by C-G formula based on Cr of 0.44).   Recent Labs  04/08/13 0726 04/08/13 1655 04/08/13 2358  GLUCAP 92 230* 177*   Medications:  Infusions:  . dextrose 5 % and 0.45 % NaCl with KCl 20 mEq/L 50 mL/hr at 04/09/13 0600  . Marland KitchenTPN (CLINIMIX-E) Adult 60 mL/hr at 04/08/13 1804   And  . fat emulsion 250 mL (04/08/13 1804)   Insulin Requirements in the past 24 hours:   CBGs: 92-177  3 units Novolog SSI (on q8h sensitive scale)  Current Nutrition:   NPO since 3/24   Clinimix-E 5/15 at 60 mL/hr + Fat Emulsion 20% at 10 mL/hr  Maintenance IVF: D-5-1/2 NS + KCl 80mEq/L at 50 mL/hr  Nutritional Goals:   Per RD assessment  3/23: Kcal: 1400-1600 /day, Protein: 65-80g / day  Goal Rate:  Clinimix-E 5/15 at 60 mL/hr + IVFE 20% at 10 mL/hr to deliver 72 grams protein/d, 1502 KCal/d   Assessment: 43 y/o F s/p RYGB in 2007 presented for upper endoscopy to evaluate persistent nausea, vomiting, and abdominal pain.  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  Orders received to begin TPN with pharmacy assistance.  Plan is for TPN x 7 days and then revision of GJ on Tuesday 3/31.  4/1: Day 10 TPN.  Remains NPO.  For OR today for revision GJ and possible gastrectomy.  Was started on IV antibiotics for dental abscess 3/26.     Renal function: SCr and BUN WNL, stable  Hepatic function: all LFTs below ULN  Electrolytes: all WNL, exp Na 132-cannot adjust in TNA  Pre-Albumin: 16.3 (WNL) (3/23); 14.1 (3/30), down  TG: WNL  Glucose: CBGs < 150 on minimal sliding scale insulin coverage  TPN Access: PICC (placed 3/23)  Plan:    Continue Clinimix E 5/15 at 60 ml/hr (goal rate).  Continue 20% fat emulsion at 10 ml/hr.  TNA to contain standard multivitamins daily, but trace elements MWF only (due to national supply shortage)  Continue maintenance IVF at 50 ml/hr per MD  Swallow study today  TNA lab panels on Mondays & Thursdays.  Minda Ditto PharmD Pager [phone] 04/09/2013, 8:56 AM

## 2013-04-10 LAB — CBC WITH DIFFERENTIAL/PLATELET
Basophils Absolute: 0 10*3/uL (ref 0.0–0.1)
Basophils Relative: 0 % (ref 0–1)
Eosinophils Absolute: 0.2 10*3/uL (ref 0.0–0.7)
Eosinophils Relative: 2 % (ref 0–5)
HCT: 29.2 % — ABNORMAL LOW (ref 36.0–46.0)
HEMOGLOBIN: 9 g/dL — AB (ref 12.0–15.0)
LYMPHS PCT: 22 % (ref 12–46)
Lymphs Abs: 1.6 10*3/uL (ref 0.7–4.0)
MCH: 24.7 pg — ABNORMAL LOW (ref 26.0–34.0)
MCHC: 30.8 g/dL (ref 30.0–36.0)
MCV: 80 fL (ref 78.0–100.0)
MONOS PCT: 8 % (ref 3–12)
Monocytes Absolute: 0.6 10*3/uL (ref 0.1–1.0)
NEUTROS ABS: 4.7 10*3/uL (ref 1.7–7.7)
Neutrophils Relative %: 67 % (ref 43–77)
Platelets: 246 10*3/uL (ref 150–400)
RBC: 3.65 MIL/uL — ABNORMAL LOW (ref 3.87–5.11)
RDW: 15.8 % — ABNORMAL HIGH (ref 11.5–15.5)
WBC: 7.1 10*3/uL (ref 4.0–10.5)

## 2013-04-10 LAB — COMPREHENSIVE METABOLIC PANEL
ALBUMIN: 3 g/dL — AB (ref 3.5–5.2)
ALT: 9 U/L (ref 0–35)
AST: 20 U/L (ref 0–37)
Alkaline Phosphatase: 75 U/L (ref 39–117)
BUN: 12 mg/dL (ref 6–23)
CO2: 28 mEq/L (ref 19–32)
CREATININE: 0.47 mg/dL — AB (ref 0.50–1.10)
Calcium: 8.8 mg/dL (ref 8.4–10.5)
Chloride: 99 mEq/L (ref 96–112)
GFR calc Af Amer: >90 mL/min (ref >90)
GFR calc non Af Amer: >90 mL/min (ref >90)
Glucose, Bld: 110 mg/dL — ABNORMAL HIGH (ref 70–99)
Potassium: 3.9 mEq/L (ref 3.7–5.3)
Sodium: 136 mEq/L — ABNORMAL LOW (ref 137–147)
TOTAL PROTEIN: 6.4 g/dL (ref 6.0–8.3)

## 2013-04-10 LAB — GLUCOSE, CAPILLARY
GLUCOSE-CAPILLARY: 89 mg/dL (ref 70–99)
Glucose-Capillary: 95 mg/dL (ref 70–99)
Glucose-Capillary: 93 mg/dL (ref 70–99)

## 2013-04-10 LAB — PHOSPHORUS: PHOSPHORUS: 3.9 mg/dL (ref 2.3–4.6)

## 2013-04-10 LAB — MAGNESIUM: Magnesium: 1.8 mg/dL (ref 1.5–2.5)

## 2013-04-10 MED ORDER — FAT EMULSION 20 % IV EMUL
250.0000 mL | INTRAVENOUS | Status: DC
  Administered 2013-04-10: 250 mL via INTRAVENOUS
  Filled 2013-04-10: qty 250

## 2013-04-10 MED ORDER — M.V.I. ADULT IV INJ
INTRAVENOUS | Status: DC
  Administered 2013-04-10: 18:00:00 via INTRAVENOUS
  Filled 2013-04-10: qty 2000

## 2013-04-10 NOTE — Progress Notes (Signed)
General Surgery Note  LOS: 11 days  POD -  2 Days Post-Op  Assessment/Plan: 1.  LAPAROSCOPIC REVISION OF GASTROJEJUNOSTOMY AND UPPER ENDOSCOPY - 04/08/2013 - D. Flossie Wexler  Swallow okay  On sips of water.  To advance to clear liquid tray and see how she tolerates.  2. 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 Timmothy Sours in [place] (was with [facility]), but she said that he has moved.   She has not seen him in at least 5 years.  3. Malnourished.   On TPN  Albumin -04/10/2013 - 3.0  4. ADD - since age 75  5. Anxiety disorder  6. Hgb - 9.0 - on 04/10/2013  7. Smokes cigarettes - approx 1 ppd  Has stopped since admission to hospital 8.  DVT prophylaxis - SQ Heparin 9.  Left lower jaw abscess/inlammation  On Unasyn  This is slowly looking better.   Principal Problem:   Gastrojejunal ulcer s/p Lap gastric bypass Active Problems:   Anxiety disorder   Vomiting   Protein-calorie malnutrition, severe   S/P gastric bypass 2007   Tobacco abuse   Cachexia  Subjective:  Doing okay.  Tolerating water.  Objective:   Filed Vitals:   04/09/13 2230  BP: 133/77  Pulse: 79  Temp: 98.3 F (36.8 C)  Resp: 18     Intake/Output from previous day:  04/01 0701 - 04/02 0700 In: 2040 [I.V.:900; IV Piggyback:300; TPN:840] Out: 3700 [Urine:3700]  Intake/Output this shift:  Total I/O In: 400 [I.V.:400] Out: 1050 [Urine:1050]   Physical Exam:   General: Thin WF who is alert and oriented.    HEENT: Normal. Pupils equal. .   Lungs: Clear    Abdomen: Soft, has a few BS.   Wound: Look clean   Lab Results:     Recent Labs  04/09/13 0540 04/10/13 0357  WBC 6.9 7.1  HGB 8.4* 9.0*  HCT 26.8* 29.2*  PLT 186 246    BMET    Recent Labs  04/09/13 0540 04/10/13 0357  NA 132* 136*  K 3.7 3.9  CL 95* 99  CO2 27 28  GLUCOSE 122* 110*  BUN 14 12  CREATININE 0.44* 0.47*  CALCIUM 8.4 8.8    PT/INR    Recent Labs  04/07/13 1035  LABPROT 13.4  INR 1.04    ABG  No results found for this  basename: PHART, PCO2, PO2, HCO3,  in the last 72 hours   Studies/Results:  Dg Ugi W/water Sol Cm  04/09/2013   CLINICAL DATA:  43 year old female status post gastrojejunostomy revision 1 day ago. Initial encounter.  EXAM: WATER SOLUBLE UPPER GI SERIES  TECHNIQUE: Single-column upper GI series was performed using water soluble contrast.  CONTRAST:  15mL OMNIPAQUE IOHEXOL 300 MG/ML  SOLN  COMPARISON:  Preoperative upper GI 04/01/2013.  FLUOROSCOPY TIME:  1 min 36 seconds  FINDINGS: The patient was first given a small sip of water p.o. and tolerated it without difficulty.  The patient tolerated about 30 mL of Omnipaque 300 contrast well, taken in several small sips.  No obstruction to the forward flow of contrast throughout the esophagus and to the stomach.  Initially the small volume of contrast opacified the gastric fundus and stopped. 10 min later, contrasted passed through the gastrojejunostomy and outlined 1 or 2 proximal small bowel loops. The patient was asymptomatic. No abnormal collection of contrast is identified.  IMPRESSION: Patent gastrojejunostomy, with mildly delayed contrast passage likely due to postoperative edema. No adverse features.  Electronically Signed   By: Lars Pinks M.D.   On: 04/09/2013 12:05     Anti-infectives:   Anti-infectives   Start     Dose/Rate Route Frequency Ordered Stop   04/03/13 1400  Ampicillin-Sulbactam (UNASYN) 3 g in sodium chloride 0.9 % 100 mL IVPB     3 g 100 mL/hr over 60 Minutes Intravenous Every 6 hours 04/03/13 1339     04/03/13 1000  amoxicillin-clavulanate (AUGMENTIN) 875-125 MG per tablet 1 tablet  Status:  Discontinued     1 tablet Oral Every 12 hours 04/03/13 0851 04/03/13 1335      Alphonsa Overall, MD, FACS Pager: [place] [facility] Office: [phone] 04/10/2013

## 2013-04-10 NOTE — Progress Notes (Signed)
ANTIBIOTIC CONSULT NOTE - follow up  Pharmacy Consult for  Unasyn  Indication: Dental abscess  Allergies  Allergen Reactions  . Nsaids     Anemia, and Gastric bypass  . Promethazine Hcl Other (See Comments)    Agitated, restless  . Tramadol Rash   Patient Measurements: Height: 5\' 4"  (162.6 cm) Weight: 96 lb 1.6 oz (43.591 kg) IBW/kg (Calculated) : 54.7  Vital Signs: Temp: 98.2 F (36.8 C) (04/02 0630) Temp src: Oral (04/02 0630) BP: 123/80 mmHg (04/02 0630) Pulse Rate: 79 (04/02 0630) Intake/Output from previous day: 04/01 0701 - 04/02 0700 In: 2040 [I.V.:900; IV Piggyback:300; TPN:840] Out: 4000 [Urine:4000] Intake/Output from this shift:    Labs:  Recent Labs  04/09/13 0540 04/10/13 0357  WBC 6.9 7.1  HGB 8.4* 9.0*  PLT 186 246  CREATININE 0.44* 0.47*   Estimated Creatinine Clearance: 63.1 ml/min (by C-G formula based on Cr of 0.47). No results found for this basename: VANCOTROUGH, Corlis Leak, VANCORANDOM, Marcus, GENTPEAK, GENTRANDOM, TOBRATROUGH, TOBRAPEAK, TOBRARND, AMIKACINPEAK, AMIKACINTROU, AMIKACIN,  in the last 72 hours   Microbiology: Recent Results (from the past 720 hour(s))  MRSA PCR SCREENING     Status: None   Collection Time    03/30/13  9:55 PM      Result Value Ref Range Status   MRSA by PCR NEGATIVE  NEGATIVE Final   Comment:            The GeneXpert MRSA Assay (FDA     approved for NASAL specimens     only), is one component of a     comprehensive MRSA colonization     surveillance program. It is not     intended to diagnose MRSA     infection nor to guide or     monitor treatment for     MRSA infections.   Medications:  Scheduled:  . amphetamine-dextroamphetamine  30 mg Oral BID  . ampicillin-sulbactam (UNASYN) IV  3 g Intravenous Q6H  . famotidine (PEPCID) IV  20 mg Intravenous Q12H  . heparin subcutaneous  5,000 Units Subcutaneous 3 times per day  . insulin aspart  0-9 Units Subcutaneous Q8H  . nicotine  14 mg  Transdermal Daily  . pantoprazole  40 mg Oral BID  . sertraline  50 mg Oral Daily  . sucralfate  1 g Oral TID WC & HS  . thiamine IV  100 mg Intravenous Daily   Assessment: 43 yo F with fractured tooth with an associated abscess, She was started on augmentin but was unable to tolerate.  Pharmacy asked to dose Unasyn.   Tmax: remains AF WBCs:  WNL; Renal: SCr stable, CrCl 63  No micro  Goal of Therapy:  Unasyn per renal function   Plan:  D#8 Unasyn 1.) Unasyn 3 grams IV q6h 2.) Continue to monitor renal function  3.) WIll follow at distance, will write note only if change in dose needed.  Thank you  Doreene Eland, PharmD, BCPS.   Pager: [phone] 7:58 AM 04/10/2013

## 2013-04-10 NOTE — Progress Notes (Signed)
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  Allergies  Allergen Reactions  . Nsaids     Anemia, and Gastric bypass  . Promethazine Hcl Other (See Comments)    Agitated, restless  . Tramadol Rash   Patient Measurements: Height: 5\' 4"  (162.6 cm) Weight: 96 lb 1.6 oz (43.591 kg) IBW/kg (Calculated) : 54.7  Vital Signs: Temp: 98.2 F (36.8 C) (04/02 0630) Temp src: Oral (04/02 0630) BP: 123/80 mmHg (04/02 0630) Pulse Rate: 79 (04/02 0630)  Intake/Output from previous day: 04/01 0701 - 04/02 0700 In: 2040 [I.V.:900; IV Piggyback:300; TPN:840] Out: 4000 [Urine:4000]  Labs:  Recent Labs  04/07/13 1035 04/09/13 0540 04/10/13 0357  WBC  --  6.9 7.1  HGB  --  8.4* 9.0*  HCT  --  26.8* 29.2*  PLT  --  186 246  APTT 30  --   --   INR 1.04  --   --     Recent Labs  04/09/13 0540 04/10/13 0357  NA 132* 136*  K 3.7 3.9  CL 95* 99  CO2 27 28  GLUCOSE 122* 110*  BUN 14 12  CREATININE 0.44* 0.47*  CALCIUM 8.4 8.8  MG  --  1.8  PHOS  --  3.9  PROT  --  6.4  ALBUMIN  --  3.0*  AST  --  20  ALT  --  9  ALKPHOS  --  75  BILITOT  --  <0.2*  Corrected Ca = 8.7 Estimated Creatinine Clearance: 63.1 ml/min (by C-G formula based on Cr of 0.47).   Recent Labs  04/09/13 0806 04/09/13 1631 04/09/13 2342  GLUCAP 125* 146* 121*   Medications:  Infusions:  . dextrose 5 % and 0.45 % NaCl with KCl 20 mEq/L 50 mL/hr at 04/10/13 0200  . Marland KitchenTPN (CLINIMIX-E) Adult 60 mL/hr at 04/09/13 1814   And  . fat emulsion 250 mL (04/09/13 1813)   Insulin Requirements in the past 24 hours:   CBGs: Controlled 150mg /dl  3 units Novolog SSI (on q8h sensitive scale)  Current Nutrition:   NPO since 3/24   Clinimix-E 5/15 at 60 mL/hr + Fat Emulsion 20% at 10 mL/hr  Maintenance IVF: D-5-1/2 NS + KCl 62mEq/L at [facility]  Nutritional Goals:   Per RD assessment 3/23: Kcal:  1400-1600 /day, Protein: 65-80g / day  Goal Rate:  Clinimix-E 5/15 at 60 mL/hr + IVFE 20% at 10 mL/hr to deliver 72 grams protein/d, 1502 KCal/d   Assessment: 43 y/o F s/p RYGB in 2007 presented for upper endoscopy to evaluate persistent nausea, vomiting, and abdominal pain.  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  Orders received to begin TPN with pharmacy assistance.  Plan is for TPN x 7 days and then revision of GJ on Tuesday 3/31.  4/2: Day#11 TPN.  Remains NPO.  4/1 OR - laparoscopic revision of gastrojejunostomy    Renal function: SCr and BUN WNL, stable  Hepatic function: all LFTs below ULN  Electrolytes: all WNL, exp Na 132-cannot adjust in TNA  Pre-Albumin: 16.3 (WNL) (3/23); 14.1 (3/30), down  TG: WNL  Glucose: CBGs < 150 on minimal sliding scale insulin coverage  TPN Access: PICC (placed 3/23)   Plan:    Continue Clinimix E 5/15 at 60 ml/hr (goal rate).  Continue 20% fat emulsion at 10 ml/hr.  TNA to contain standard multivitamins daily, but trace elements MWF only (due to national supply shortage)  TNA lab panels on Mondays & Thursdays.  Await start of diet when appropriate  Doreene Eland, PharmD, BCPS.   Pager: [phone] 04/10/2013, 7:13 AM

## 2013-04-10 NOTE — Care Management Note (Addendum)
    Page 1 of 2   04/10/2013     11:59:05 AM   CARE MANAGEMENT NOTE 04/10/2013  Patient:  Katherine [name], PEARMAN   Account Number:  [id number]  Date Initiated:  04/07/2013  Documentation initiated by:  DAVIS,TYMEEKA  Subjective/Objective Assessment:   43 yo female asmitted with severe n/v.Primary care physician is Dr. Annye Asa     Action/Plan:   Home when stable   Anticipated DC Date:  04/12/2013   Anticipated DC Plan:  [place]  CM consult      Choice offered to / List presented to:             Status of service:  In process, will continue to follow Medicare Important Message given?   (If response is "NO", the following Medicare IM given date fields will be blank) Date Medicare IM given:   Date Additional Medicare IM given:    Discharge Disposition:    Per UR Regulation:  Reviewed for med. necessity/level of care/duration of stay  If discussed at [place] of Stay Meetings, dates discussed:   04/10/2013    Comments:  04/10/13 Juron Vorhees RN,BSN NCM [phone] POD#11 LAP REVISION GASTROJEJUNOSTOMY, & UPPER ENDO,DENTAL ABSCESS,MALNUTRITION-TPN, ADVANCE TO CLEARS.IV ABX.D/C PLAN HOME.   Mechele Claude, RN Registered Nurse Signed CASE MANAGEMENT Care Management Note Service date: 04/07/2013 2:45 PM CM spoke with patient at the bedside concerning Cm consult for community Dental Resources. Pt provided information concerning dentist whom accept self payments. Per pt unsure if dental insurance becomes effective in July. Per pt would like to consult Lenn Cal, DDS, whom examined pt during course of hospital stay. CM to continue to follow.

## 2013-04-11 LAB — GLUCOSE, CAPILLARY
Glucose-Capillary: 111 mg/dL — ABNORMAL HIGH (ref 70–99)
Glucose-Capillary: 128 mg/dL — ABNORMAL HIGH (ref 70–99)
Glucose-Capillary: 96 mg/dL (ref 70–99)

## 2013-04-11 MED ORDER — TRACE MINERALS CR-CU-F-FE-I-MN-MO-SE-ZN IV SOLN
INTRAVENOUS | Status: DC
  Filled 2013-04-11: qty 2000

## 2013-04-11 MED ORDER — AMOXICILLIN-POT CLAVULANATE 500-125 MG PO TABS
1.0000 | ORAL_TABLET | Freq: Two times a day (BID) | ORAL | Status: DC
  Administered 2013-04-11 – 2013-04-14 (×7): 500 mg via ORAL
  Filled 2013-04-11 (×8): qty 1

## 2013-04-11 MED ORDER — UNJURY VANILLA POWDER
2.0000 [oz_av] | Freq: Four times a day (QID) | ORAL | Status: DC
  Administered 2013-04-11 – 2013-04-14 (×11): 2 [oz_av] via ORAL
  Filled 2013-04-11 (×14): qty 27

## 2013-04-11 MED ORDER — M.V.I. ADULT IV INJ: INTRAVENOUS | Status: AC

## 2013-04-11 MED ORDER — PANTOPRAZOLE SODIUM 40 MG PO TBEC
40.0000 mg | DELAYED_RELEASE_TABLET | Freq: Every day | ORAL | Status: DC
  Administered 2013-04-11 – 2013-04-14 (×4): 40 mg via ORAL
  Filled 2013-04-11 (×4): qty 1

## 2013-04-11 MED ORDER — FAT EMULSION 20 % IV EMUL
250.0000 mL | INTRAVENOUS | Status: DC
  Filled 2013-04-11: qty 250

## 2013-04-11 MED ORDER — OXYCODONE-ACETAMINOPHEN 5-325 MG PO TABS
1.0000 | ORAL_TABLET | ORAL | Status: DC | PRN
  Administered 2013-04-11 – 2013-04-14 (×15): 2 via ORAL
  Filled 2013-04-11 (×17): qty 2

## 2013-04-11 NOTE — Progress Notes (Signed)
General Surgery Note  LOS: 12 days  POD -  3 Days Post-Op  Assessment/Plan: 1.  LAPAROSCOPIC REVISION OF GASTROJEJUNOSTOMY AND UPPER ENDOSCOPY - 04/08/2013 - D. [employer] clear liquids. Will advance to full liquids.  If tolerated my go home tomorrow.  I want her to stay on full liquids (no solid food for at least 10 days).  She has prescriptions for oxycodone, nicotine dermal patches, Protonix, and Augmentin  2. 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 Timmothy Sours in [place] (was with Dewitt Hoes), but she said that he has moved.   She has not seen him in at least 5 years.  3. Malnourished.   On TPN  Will stop TPN today. 4. ADD - since age 43  5. Anxiety disorder  6. Hgb - 9.0 - on 04/10/2013  7. Smokes cigarettes - approx 1 ppd  Has stopped since admission to hospital.  On nicotine patch. 8.  DVT prophylaxis - SQ Heparin 9.  Left lower jaw abscess/inlammation  On Unasyn - will switch to Augmentin in anticipation of discharge.  This is looks better.   Principal Problem:   Gastrojejunal ulcer s/p Lap gastric bypass Active Problems:   Anxiety disorder   Vomiting   Protein-calorie malnutrition, severe   S/P gastric bypass 2007   Tobacco abuse   Cachexia  Subjective:  Doing well.  Tolerating liquids.  No nausea or vomiting.  Objective:   Filed Vitals:   04/11/13 0650  BP: 97/66  Pulse: 102  Temp: 97.3 F (36.3 C)  Resp: 18     Intake/Output from previous day:  04/02 0701 - 04/03 0700 In: 3060 [P.O.:1160; I.V.:510; IV Piggyback:200; TPN:1190] Out: 600 [Urine:600]  Intake/Output this shift:      Physical Exam:   General: Thin WF who is alert and oriented.    HEENT: Normal. Pupils equal. .   Lungs: Clear    Abdomen: Soft, has a few BS.   Wound: Look clean   Lab Results:     Recent Labs  04/09/13 0540 04/10/13 0357  WBC 6.9 7.1  HGB 8.4* 9.0*  HCT 26.8* 29.2*  PLT 186 246    BMET    Recent Labs  04/09/13 0540 04/10/13 0357  NA 132* 136*  K 3.7 3.9   CL 95* 99  CO2 27 28  GLUCOSE 122* 110*  BUN 14 12  CREATININE 0.44* 0.47*  CALCIUM 8.4 8.8    PT/INR   No results found for this basename: LABPROT, INR,  in the last 72 hours  ABG  No results found for this basename: PHART, PCO2, PO2, HCO3,  in the last 72 hours   Studies/Results:  Dg Ugi W/water Sol Cm  04/09/2013   CLINICAL DATA:  43 year old female status post gastrojejunostomy revision 1 day ago. Initial encounter.  EXAM: WATER SOLUBLE UPPER GI SERIES  TECHNIQUE: Single-column upper GI series was performed using water soluble contrast.  CONTRAST:  29mL OMNIPAQUE IOHEXOL 300 MG/ML  SOLN  COMPARISON:  Preoperative upper GI 04/01/2013.  FLUOROSCOPY TIME:  1 min 36 seconds  FINDINGS: The patient was first given a small sip of water p.o. and tolerated it without difficulty.  The patient tolerated about 30 mL of Omnipaque 300 contrast well, taken in several small sips.  No obstruction to the forward flow of contrast throughout the esophagus and to the stomach.  Initially the small volume of contrast opacified the gastric fundus and stopped. 10 min later, contrasted passed through the gastrojejunostomy and  outlined 1 or 2 proximal small bowel loops. The patient was asymptomatic. No abnormal collection of contrast is identified.  IMPRESSION: Patent gastrojejunostomy, with mildly delayed contrast passage likely due to postoperative edema. No adverse features.   Electronically Signed   By: Lars Pinks M.D.   On: 04/09/2013 12:05     Anti-infectives:   Anti-infectives   Start     Dose/Rate Route Frequency Ordered Stop   04/03/13 1400  Ampicillin-Sulbactam (UNASYN) 3 g in sodium chloride 0.9 % 100 mL IVPB     3 g 100 mL/hr over 60 Minutes Intravenous Every 6 hours 04/03/13 1339     04/03/13 1000  amoxicillin-clavulanate (AUGMENTIN) 875-125 MG per tablet 1 tablet  Status:  Discontinued     1 tablet Oral Every 12 hours 04/03/13 0851 04/03/13 1335      Alphonsa Overall, MD, FACS Pager:  [facility] Office: [phone] 04/11/2013

## 2013-04-11 NOTE — Progress Notes (Signed)
NUTRITION FOLLOW UP  Intervention:   - Had long discussion about diet plan for the bariatric full liquids for the next 10 days. Example of sample liquids and meals discussed, handouts provided with RD contact information. Provided list of recommend bariatric surgery multivitamins. Provided pt with samples of Unjury. - Assisted pt with ordering meals - Unjury 2oz QID  - Will continue to monitor   Nutrition Dx:   Inadequate oral intake related to inability to eat as evidenced by NPO - resolved, pt eating well on full liquid diet   Goal:   TPN to meet >90% of estimated nutritional needs - not met, TPN d/c  New goal: Pt to consume >90% of meals/supplements   Monitor:   Weights, labs, intake  Assessment:   Pt presents to the Emergency Department complaining of unexpected weight loss of 14 pounds x 2 weeks. She reports losing 3 pounds in the last 3 days, and 24 pounds in the last 4 months. Pt also reports ongoing abdominal pain, nausea, hick ups (onset 1 week), dizziness, abdominal fullness, and vomiting (about 10 episodes daily). She states she is unable to keep anything down including fluids. After eating, she says the undigested food comes back up after 10-90 minutes. Pt states she had been taking Prilosec 40 mg daily, but recently switched to Maalox (1 packet daily) 3-4 days ago. Pt has been taking 4 mg of Zofran every 6 hours daily to help with vomiting. Last small BM today, prior to today pt had a BM about 2 weeks ago. She is urinating about 1-2 times a day consisting of dark yellow urine x 6 days. Hx of gastric bypass in 2007. She said that her initial weight was 232 pounds. Her weight was stable in the 130's until her divorce a couple of years ago, then her weight went to the 110's pounds. Endoscopy today revealed stenosis of gastrojejunal anastomosis no more than 30m with marginal ulcer and erosion of silastic ring into adjacent area. Orders received to begin TPN.   3/23 - Met with pt who  reports nausea controlled today by Zofran and denied any nausea. States she couldn't keep down any foods/liquids for the past 2 weeks. Reports before then she would graze on a variety of foods. States her usual weight is 107 pounds but her goal is 120 pounds. Pt requested to rest however observed pt with severe muscle wasting and subcutaneous fat loss in orbital and temporal region, clavicles, upper arms, and hands. Per discussion with RN, surgeon may place J tube for TF later on in the week.  3/30 - Dentist following for left facial swelling, dental caries, periodontitis, and missing teeth. Plan is for GJ stenosis revision tomorrow. Attempted to meet with pt, however SLP working with pt.   4/3 - Reviewed notes since last RD visit. Per MD, pt had episode of vomiting 3/30. Had revision of gastrojejunostomy 3/31 with upper endoscopy. Advanced to full liquids today. Bariatric clear and full liquid diet education provided by RN and pt provided with information of bariatric coordinator. Per surgeon's notes, plan is for pt to stay on full liquids for 10 days.   Met with pt who reports eating well today, had some Unjury, milk, grits, ice cream, and italian ice this morning which she ate most of and denied any nausea/pain after eating. TPN d/c today. Noted plans for possible d/c tomorrow.    Height: Ht Readings from Last 1 Encounters:  04/01/13 5' 4"  (1.626 m)    Weight Status:  Wt Readings from Last 1 Encounters:  04/11/13 97 lb 3 oz (44.084 kg)  Admit wt:        95 lb 14.4 oz (43.5 kg)  Estimated needs:  Kcal: 1400-1600  Protein: 65-80g  Fluid: 1.4-1.6L/day   Skin: Intact   Diet Order: Full Liquid   Intake/Output Summary (Last 24 hours) at 04/11/13 1407 Last data filed at 04/11/13 1205  Gross per 24 hour  Intake 2213.5 ml  Output      0 ml  Net 2213.5 ml    Last BM: 3/30   Labs:   Recent Labs Lab 04/05/13 0524 04/06/13 0435 04/07/13 0430 04/09/13 0540 04/10/13 0357  NA  140 138 138 132* 136*  K 3.8 4.0 4.0 3.7 3.9  CL 101 103 101 95* 99  CO2 27 30 28 27 28   BUN 16 14 14 14 12   CREATININE 0.56 0.53 0.52 0.44* 0.47*  CALCIUM 9.1 8.6 8.7 8.4 8.8  MG 2.0  --  2.0  --  1.8  PHOS 4.6 4.6 4.4  --  3.9  GLUCOSE 94 104* 95 122* 110*    CBG (last 3)   Recent Labs  04/10/13 1954 04/11/13 0017 04/11/13 0802  GLUCAP 89 111* 128*    Scheduled Meds: . amoxicillin-clavulanate  1 tablet Oral BID  . amphetamine-dextroamphetamine  30 mg Oral BID  . heparin subcutaneous  5,000 Units Subcutaneous 3 times per day  . nicotine  14 mg Transdermal Daily  . pantoprazole  40 mg Oral Daily  . sertraline  50 mg Oral Daily    Continuous Infusions: . dextrose 5 % and 0.45 % NaCl with KCl 20 mEq/L 20 mL/hr at 04/10/13 [place] MS, RD, [place] Pager [phone] After Hours Pager

## 2013-04-11 NOTE — Progress Notes (Signed)
Visit with patient to answer questions regarding expectations for discharge. Reinforced Bariatric Clear and Full liquid diet, as anticipate discharge tomorrow if patient tolerates full liquids. Provided copy of signs and symptoms to report as well as contact card with emergency numbers. Patient also given phone numbers for [facility] (to call on Monday, 04/14/13 for a follow-up appointment in 2 weeks); also given the number for the [facility], recommended follow-up. Has contact information for the bariatric coordinator. Information given for the pharmacy, bariatric support group.  Spoke with patient's mother (per patient request) and provided her like information.  Patient able to articulate with [employer], instructions.

## 2013-04-11 NOTE — Progress Notes (Addendum)
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  Allergies  Allergen Reactions  . Nsaids     Anemia, and Gastric bypass  . Promethazine Hcl Other (See Comments)    Agitated, restless  . Tramadol Rash   Patient Measurements: Height: 5\' 4"  (162.6 cm) Weight: 97 lb 3 oz (44.084 kg) IBW/kg (Calculated) : 54.7  Vital Signs: Temp: 97.3 F (36.3 C) (04/03 0650) Temp src: Oral (04/03 0650) BP: 97/66 mmHg (04/03 0650) Pulse Rate: 102 (04/03 0650)  Intake/Output from previous day: 04/02 0701 - 04/03 0700 In: 3060 [P.O.:1160; I.V.:510; IV Piggyback:200; TPN:1190] Out: 600 [Urine:600]  Labs:  Recent Labs  04/09/13 0540 04/10/13 0357  WBC 6.9 7.1  HGB 8.4* 9.0*  HCT 26.8* 29.2*  PLT 186 246    Recent Labs  04/09/13 0540 04/10/13 0357  NA 132* 136*  K 3.7 3.9  CL 95* 99  CO2 27 28  GLUCOSE 122* 110*  BUN 14 12  CREATININE 0.44* 0.47*  CALCIUM 8.4 8.8  MG  --  1.8  PHOS  --  3.9  PROT  --  6.4  ALBUMIN  --  3.0*  AST  --  20  ALT  --  9  ALKPHOS  --  75  BILITOT  --  <0.2*  Corrected Ca = 8.7 Estimated Creatinine Clearance: 63.8 ml/min (by C-G formula based on Cr of 0.47).   Recent Labs  04/10/13 1559 04/10/13 1954 04/11/13 0017  GLUCAP 93 89 111*   Medications:  Infusions:  . dextrose 5 % and 0.45 % NaCl with KCl 20 mEq/L 20 mL/hr at 04/10/13 0715  . Marland KitchenTPN (CLINIMIX-E) Adult 60 mL/hr at 04/10/13 1819   And  . fat emulsion 250 mL (04/10/13 1819)   Insulin Requirements in the past 24 hours:   CBGs: Controlled 150mg /dl  0 units Novolog SSI (on q8h sensitive scale)  Current Nutrition:   NPO since 3/24   Clinimix-E 5/15 at 60 mL/hr + Fat Emulsion 20% at 10 mL/hr  Maintenance IVF: D-5-1/2 NS + KCl 80mEq/L at [facility]  Nutritional Goals:   Per RD assessment 3/23: Kcal: 1400-1600 /day, Protein: 65-80g / day  Goal Rate:  Clinimix-E 5/15 at  60 mL/hr + IVFE 20% at 10 mL/hr to deliver 72 grams protein/d, 1502 KCal/d   Assessment: 43 y/o F s/p RYGB in 2007 presented for upper endoscopy to evaluate persistent nausea, vomiting, and abdominal pain.  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  Orders received to begin TPN with pharmacy assistance.  Plan is for TPN x 7 days and then revision of GJ on Tuesday 3/31.  4/3: Day#12 TPN.  4/1 OR - laparoscopic revision of gastrojejunostomy, started on sips of clears 4/2 (advance to clears if tolerates sips)   Renal function: No labs today. SCr and BUN WNL, stable  Hepatic function: all LFTs below ULN  Electrolytes: all WNL, exp Na 132-cannot adjust in TNA  Pre-Albumin: 16.3 (WNL) (3/23); 14.1 (3/30), down  TG: WNL  Glucose: CBGs < 150 on minimal sliding scale insulin coverage  TPN Access: PICC (placed 3/23)   Plan:    Continue Clinimix E 5/15 at 60 ml/hr (goal rate).  Continue 20% fat emulsion at 10 ml/hr.  TNA to contain standard multivitamins daily, but trace elements MWF only (due to national supply shortage)  TNA lab panels  on Mondays & Thursdays.  Monitor diet advancement and adjust TPN accordingly  Doreene Eland, PharmD, BCPS.   Pager: [phone] 04/11/2013, 7:10 AM  Addendum:  CCS has seen patient and has entered orders to stop TPN today and advance diet  Plan:   Stop lipids  Reduce TPN rate to 103ml/hr and d/c at noon  D/C TPN labs, etc.   Doreene Eland, PharmD, BCPS.   Pager: [phone] 04/11/2013 8:10 AM

## 2013-04-12 MED ORDER — DOCUSATE SODIUM 100 MG PO CAPS
100.0000 mg | ORAL_CAPSULE | Freq: Two times a day (BID) | ORAL | Status: DC
  Administered 2013-04-12 – 2013-04-14 (×3): 100 mg via ORAL
  Filled 2013-04-12 (×6): qty 1

## 2013-04-12 MED ORDER — BISACODYL 10 MG RE SUPP
10.0000 mg | Freq: Once | RECTAL | Status: AC
  Administered 2013-04-12: 10 mg via RECTAL
  Filled 2013-04-12: qty 1

## 2013-04-12 NOTE — Progress Notes (Signed)
General Surgery Note  LOS: 13 days  POD -  4 Days Post-Op  Assessment/Plan: 1.  LAPAROSCOPIC REVISION OF GASTROJEJUNOSTOMY AND UPPER ENDOSCOPY - 04/08/2013 - D. [employer] clear liquids. Will advance to full liquids.  If tolerated my go home tomorrow.  I want her to stay on full liquids (no solid food for at least 10 days).  She has prescriptions for oxycodone, nicotine dermal patches, Protonix, and Augmentin  2. 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 Timmothy Sours in [place] (was with [facility]itt Hoes), but she said that he has moved.   She has not seen him in at least 5 years.  3. Malnourished.   Off TPN  4. ADD - since age 32  5. Anxiety disorder  6. Hgb - 9.0 - on 04/10/2013  7. Smokes cigarettes - approx 1 ppd  Has stopped since admission to hospital.  On nicotine patch. 8.  DVT prophylaxis - SQ Heparin 9.  Left lower jaw abscess/inlammation  On Unasyn - will switch to Augmentin in anticipation of discharge.  This is looks better. 10.  Constipation- no BM since last Sun.  Will give suppository today   Principal Problem:   Gastrojejunal ulcer s/p Lap gastric bypass Active Problems:   Anxiety disorder   Vomiting   Protein-calorie malnutrition, severe   S/P gastric bypass 2007   Tobacco abuse   Cachexia  Subjective:  Doing well.  Tolerating liquids.  No nausea or vomiting.  Objective:   Filed Vitals:   04/12/13 0500  BP: 65/61  Pulse: 77  Temp: 97.9 F (36.6 C)  Resp: 18     Intake/Output from previous day:  04/03 0701 - 04/04 0700 In: 2006.2 [P.O.:1260; I.V.:464.3; TPN:281.8] Out: 1400 [Urine:1400]  Intake/Output this shift:      Physical Exam:   General: Thin WF who is alert and oriented.    HEENT: Normal. Pupils equal. .   Lungs: Clear    Abdomen: Soft, has a few BS.   Wound: Look clean   Lab Results:     Recent Labs  04/10/13 0357  WBC 7.1  HGB 9.0*  HCT 29.2*  PLT 246    BMET    Recent Labs  04/10/13 0357  NA 136*  K 3.9  CL 99  CO2 28   GLUCOSE 110*  BUN 12  CREATININE 0.47*  CALCIUM 8.8    PT/INR   No results found for this basename: LABPROT, INR,  in the last 72 hours  ABG  No results found for this basename: PHART, PCO2, PO2, HCO3,  in the last 72 hours   Studies/Results:  No results found.   Anti-infectives:   Anti-infectives   Start     Dose/Rate Route Frequency Ordered Stop   04/11/13 1000  amoxicillin-clavulanate (AUGMENTIN) 500-125 MG per tablet 500 mg     1 tablet Oral 2 times daily 04/11/13 0757     04/03/13 1400  Ampicillin-Sulbactam (UNASYN) 3 g in sodium chloride 0.9 % 100 mL IVPB  Status:  Discontinued     3 g 100 mL/hr over 60 Minutes Intravenous Every 6 hours 04/03/13 1339 04/11/13 0757   04/03/13 1000  amoxicillin-clavulanate (AUGMENTIN) 875-125 MG per tablet 1 tablet  Status:  Discontinued     1 tablet Oral Every 12 hours 04/03/13 0851 51/88/41 6606      Paislee Szatkowski C Odette Watanabe, MD  [facility] [place] [place] [facility]  04/12/2013

## 2013-04-13 NOTE — Progress Notes (Signed)
Patient ID: Katherine [name], female   DOB: Mar 28, 1970, 43 y.o.   MRN: [id number] [facility] Progress Note:   5 Days Post-Op  Subjective: Mental status is clear.  Patient is trying to get in more protein and calories.   Objective: Vital signs in last 24 hours: Temp:  [97.3 F (36.3 C)-98.2 F (36.8 C)] 97.3 F (36.3 C) (04/05 0530) Pulse Rate:  [84-90] 84 (04/05 0530) Resp:  [18] 18 (04/05 0530) BP: (106-112)/(68-71) 107/68 mmHg (04/05 0530) SpO2:  [100 %] 100 % (04/05 0530) Weight:  [97 lb (44 kg)] 97 lb (44 kg) (04/05 0500)  Intake/Output from previous day: 04/04 0701 - 04/05 0700 In: 240 [P.O.:240] Out: 1200 [Urine:1200] Intake/Output this shift:    Physical Exam: Work of breathing is normal.  Having more issues with pain control.    Lab Results:  Results for orders placed during the hospital encounter of 03/30/13 (from the past 48 hour(s))  GLUCOSE, CAPILLARY     Status: None   Collection Time    04/11/13  5:24 PM      Result Value Ref Range   Glucose-Capillary 96  70 - 99 mg/dL    Radiology/Results: No results found.  Anti-infectives: Anti-infectives   Start     Dose/Rate Route Frequency Ordered Stop   04/11/13 1000  amoxicillin-clavulanate (AUGMENTIN) 500-125 MG per tablet 500 mg     1 tablet Oral 2 times daily 04/11/13 0757     04/03/13 1400  Ampicillin-Sulbactam (UNASYN) 3 g in sodium chloride 0.9 % 100 mL IVPB  Status:  Discontinued     3 g 100 mL/hr over 60 Minutes Intravenous Every 6 hours 04/03/13 1339 04/11/13 0757   04/03/13 1000  amoxicillin-clavulanate (AUGMENTIN) 875-125 MG per tablet 1 tablet  Status:  Discontinued     1 tablet Oral Every 12 hours 04/03/13 0851 04/03/13 1335      Assessment/Plan: Problem List: Patient Active Problem List   Diagnosis Date Noted  . Tobacco abuse 04/05/2013  . Cachexia 04/05/2013  . S/P gastric bypass 2007 04/04/2013  . 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 Protein-calorie  malnutrition, severe 04/01/2013  . Vomiting 03/30/2013  . Facial cellulitis 03/21/2012  . Anxiety disorder     Not ready for discharge.  Trying to increase PO intake.  Disposition per Dr. Lucia Gaskins tomorrow.   5 Days Post-Op    LOS: 14 days   Matt B. Hassell Done, MD, [facility] [phone] beeper [phone]  04/13/2013 9:44 AM

## 2013-04-14 NOTE — Progress Notes (Signed)
R PICC D/C per MD order 39cm intact. Vaseline pressure dsg applied, pressure x5 min. No bleeding. Lorri Frederick, RN IV Team

## 2013-04-15 ENCOUNTER — Telehealth (INDEPENDENT_AMBULATORY_CARE_PROVIDER_SITE_OTHER): Payer: Self-pay

## 2013-04-15 NOTE — Telephone Encounter (Signed)
Pt calling in b/c she is requesting a refill on Oxycodone 5mg . The pt was discharged yesterday from the hospital and was given Oxycodone 5mg  #20. The pt is stating that she was on Oxycodone 10mg  and Dilaudid in the hospital. The pt said she has been taking the Oxycodone 5mg  2 tabs every 4hrs for the pain so she is going to be out of the pain medicine by tomorrow. The pt went today to get a root canal so she is calling late today for pain medicine. I advised pt that I would send the message to Dr Lucia Gaskins and Holley Raring that we would get back in touch with her tomorrow. The pt is also wanting to schedule her post op appt with Dr Lucia Gaskins for next week. I advised pt that when she gets a call back tomorrow that Holley Raring should be able to make the appt with her on the phone. The pt understands.

## 2013-04-16 ENCOUNTER — Telehealth (INDEPENDENT_AMBULATORY_CARE_PROVIDER_SITE_OTHER): Payer: Self-pay

## 2013-04-16 ENCOUNTER — Encounter (INDEPENDENT_AMBULATORY_CARE_PROVIDER_SITE_OTHER): Payer: Self-pay

## 2013-04-16 NOTE — Telephone Encounter (Signed)
Patient aware percocet 5mg  RX is ready for P/u advised patient she needs start tappering off the narcotic per DR. Newman.Patient voiced understanding . She is asking for a Nutritional consult referral,  I will obtain an order and call her .

## 2013-04-16 NOTE — Telephone Encounter (Signed)
P/O appt 04-30-13@12 :20p with DR. Lucia Gaskins , patient is asking for a work note for possible return  05-05-13 , Percocet refill pain 6-7 . Advised her it will be this afternoon before I could get a possible RX  order ,To call before she starts out to  P/U at [facility] .   Work  letter will be with RX for P/U

## 2013-04-18 ENCOUNTER — Other Ambulatory Visit (INDEPENDENT_AMBULATORY_CARE_PROVIDER_SITE_OTHER): Payer: Self-pay

## 2013-04-21 NOTE — Discharge Summary (Signed)
Physician Discharge Summary  Patient ID:  Katherine [name]  MRN: [id number]  DOB/AGE: May 22, 1970 43 y.o.  Admit date: 03/30/2013 Discharge date: 04/21/2013 1. Gastric outlet obstruction secondary to ulcer disease and an eroded peri-anastomotic ring/  LAPAROSCOPIC REVISION OF GASTROJEJUNOSTOMY AND UPPER ENDOSCOPY - 04/08/2013 - D. Seger Jani   2. 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 Timmothy Sours in [place] (was with Dewitt Hoes), but she said that he has moved.   She has not seen him in at least 5 years.  3. Malnourished.   Received TPN pre op through post op. 4. ADD - since age 83  5. Anxiety disorder  6. Hgb - 9.0 - on 04/10/2013  7. Smokes cigarettes - approx 1 ppd   Has stopped since admission to hospital. On nicotine patch.  8. Left lower jaw abscess/inlammation   On Unasyn - will switch to Augmentin in anticipation of discharge.  This is looks better.  Discharge Diagnoses:   Principal Problem:   Gastrojejunal ulcer s/p Lap gastric bypass Active Problems:   Anxiety disorder   Vomiting   Protein-calorie malnutrition, severe   S/P gastric bypass 2007   Tobacco abuse   Cachexia  Operation: Procedure(s): 1.  Upper endoscopy - 03/31/2013 - D. Edie Vallandingham 2.  LAPAROSCOPIC REVISION OF GASTROJEJUNOSTOMY AND UPPER ENDOSCOPY on 04/08/2013 - D. Lucia Gaskins  Discharged Condition: good  Hospital Course: Katherine [name] is an 43 y.o. female whose primary care physician is HOPPER,Catlynn Grondahl, MD and who was admitted 03/30/2013 with a chief complaint of nausea, vomiting and weight loss.   She underwent an upper endo on 03/31/2013 which showed a stenosis at her gastrojejunostomy with an eroded peri-anastomotic ring. Because of her malnutrition and her smoking history, we decided to keep hier in the hospital, give her TPN and stop her smoking in preparation for surgery. During that wait, she developed an abscess of her left lower jaw.  She had known tooth problems and this was part of those problems. Dr. Oretha Milch saw her  in the hospital for her tooth abscess and suggested antibiotic coverage until she could get definitive care. She also complained of right shoulder and back pain, but plain x-rays of this area demonstrated nothing.  Then,  She was brought to the operating room on 04/08/2013 and underwent  LAPAROSCOPIC REVISION OF GASTROJEJUNOSTOMY AND UPPER ENDOSCOPY.  Post op we did an UGI which showed emptying of the stomach pouch. Her diet was slowly advanced.  She was tolerating full liquids prior to discharge. 04/08/2013 - D. Lucia Gaskins   I want her to stay on full liquids (no solid food for at least 10 days). She has prescriptions for oxycodone, nicotine dermal patches, Protonix, and Augmentin  The discharge instructions were reviewed with the patient.  Consults: Dr. Oretha Milch  Significant Diagnostic Studies:  Dg Orthopantogram  04/03/2013   CLINICAL DATA:  Left-sided facial swelling  EXAM: ORTHOPANTOGRAM/PANORAMIC  COMPARISON:  None.  FINDINGS: The mandible is well visualized and within normal limits. No acute fracture is seen. Multiple dental fillings are noted. Some increase lucency is noted surrounding the left second mandibular molar root. Additionally lucency is noted in the tooth just below a previously placed filling. These are suspicious for dental caries as well as a possibility of periapical abscess.  IMPRESSION: Changes in the left second mandibular molar suspicious of dental caries and underlying possible periapical abscess. Clinical correlation is recommended.   Electronically Signed   By: Inez Catalina M.D.   On: 04/03/2013 12:08  Dg Shoulder Right  04/04/2013   CLINICAL DATA:  Pain.  EXAM: RIGHT SHOULDER - 2+ VIEW  COMPARISON:  None.  FINDINGS: Right PICC line noted. No acute bony or joint abnormality. No evidence of fracture, dislocation, or separation. Mild soft tissue swelling over the shoulder cannot be excluded. Clinical correlation suggested.  IMPRESSION: 1. PICC line noted. 2. No acute  bony or joint abnormality. 3. Mild soft tissue swelling over the shoulder cannot be excluded, clinical correlation suggested.   Electronically Signed   By: Wayne Lakes   On: 04/04/2013 16:02   Ct Abdomen Pelvis W Contrast  03/27/2013   CLINICAL DATA:  Abdominal pain.  EXAM: CT ABDOMEN AND PELVIS WITH CONTRAST  TECHNIQUE: Multidetector CT imaging of the abdomen and pelvis was performed using the standard protocol following bolus administration of intravenous contrast.  CONTRAST:  87mL OMNIPAQUE IOHEXOL 300 MG/ML SOLN, 140mL OMNIPAQUE IOHEXOL 300 MG/ML SOLN  COMPARISON:  09/17/2012  FINDINGS: BODY WALL: Bilateral breast implants, partly imaged.  LOWER CHEST: Unremarkable.  ABDOMEN/PELVIS:  Liver: No focal abnormality.  Biliary: No evidence of biliary obstruction or stone.  Pancreas: Unremarkable.  Spleen: Unremarkable.  Adrenals: Unremarkable.  Kidneys and ureters: No hydronephrosis or stone.  Bladder: Limited assessment due to decompressed state.  Reproductive: Unremarkable.  Bowel: Large volume of formed stool, throughout the colon, with distention. 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 No evidence of gastrogastric fistula, obstruction, or internal hernia. Limited intra-abdominal fat limits visualization of the appendix. There is no evidence of right lower quadrant inflammation.  Retroperitoneum: No mass or adenopathy.  Peritoneum: No free fluid or gas.  Vascular: No acute abnormality.  OSSEOUS: Degenerative disc disease with moderate broad disc bulging at L4-5.  IMPRESSION: 1. Constipation. 2. The appendix is not visualized, but there is no evidence of pericecal inflammation. 3. Gastric bypass.  No bowel obstruction.   Electronically Signed   By: Jorje Guild M.D.   On: 03/27/2013 20:44   Dg Duanne Limerick W/kub  04/01/2013   IMPRESSION: Gastrojejunostomy high-grade obstruction, most likely at the anastomosis. Delayed images revealed almost complete lack of emptying from the stomach.   Electronically Signed    By: Marcello Moores  Register   On: 04/01/2013 13:00   Dg Duanne Limerick W/water Sol Cm  04/09/2013   CLINICAL DATA:  42 year old female status post gastrojejunostomy revision 1 day ago. Initial encounter.  EXAM: WATER SOLUBLE UPPER GI SERIES  TECHNIQUE: Single-column upper GI series was performed using water soluble contrast.  CONTRAST:  35mL OMNIPAQUE IOHEXOL 300 MG/ML  SOLN  COMPARISON:  Preoperative upper GI 04/01/2013.  FLUOROSCOPY TIME:  1 min 36 seconds  FINDINGS: The patient was first given a small sip of water p.o. and tolerated it without difficulty.  The patient tolerated about 30 mL of Omnipaque 300 contrast well, taken in several small sips.  No obstruction to the forward flow of contrast throughout the esophagus and to the stomach.  Initially the small volume of contrast opacified the gastric fundus and stopped. 10 min later, contrasted passed through the gastrojejunostomy and outlined 1 or 2 proximal small bowel loops. The patient was asymptomatic. No abnormal collection of contrast is identified.  IMPRESSION: Patent gastrojejunostomy, with mildly delayed contrast passage likely due to postoperative edema. No adverse features.   Electronically Signed   By: Lars Pinks M.D.   On: 04/09/2013 12:05   Discharge Exam:  Filed Vitals:   04/14/13 0600  BP: 93/61  Pulse: 88  Temp: 98.1 F (36.7 C)  Resp: 18  General: Thin WF who is alert and generally healthy appearing.  Lungs: Clear to auscultation and symmetric breath sounds. Heart:  RRR. No murmur or rub. Abdomen: Soft. No mass. No hernia. Normal bowel sounds. Her incisions look good.  Discharge Medications:     Medication List    STOP taking these medications       clonazePAM 0.5 MG tablet  Commonly known as:  KLONOPIN     omeprazole 40 MG capsule  Commonly known as:  PRILOSEC     polyethylene glycol packet  Commonly known as:  MIRALAX     SUBOXONE 8-2 MG Film  Generic drug:  Buprenorphine HCl-Naloxone HCl      TAKE these medications        amphetamine-dextroamphetamine 30 MG tablet  Commonly known as:  ADDERALL  Take 1 tablet (30 mg total) by mouth 2 (two) times daily.     ondansetron 4 MG tablet  Commonly known as:  ZOFRAN  Take 1 tablet (4 mg total) by mouth every 6 (six) hours.     sertraline 50 MG tablet  Commonly known as:  ZOLOFT  Take 1 tablet (50 mg total) by mouth daily.        Disposition: 01-Home or Self Care      Discharge Orders   Future Appointments Provider Department Dept Phone   04/30/2013 12:20 PM Shann Medal, MD [facility], [place] [phone]   05/23/2013 7:00 AM [place], MD [facility] [phone]   Future Orders Complete By Expires   Increase activity slowly  As directed      Signed: Alphonsa Overall, M.D., [facility] Office:  [phone]  04/21/2013, 6:24 PM

## 2013-04-23 ENCOUNTER — Ambulatory Visit: Payer: PRIVATE HEALTH INSURANCE | Admitting: Dietician

## 2013-04-24 ENCOUNTER — Ambulatory Visit: Payer: PRIVATE HEALTH INSURANCE | Admitting: Dietician

## 2013-04-24 ENCOUNTER — Telehealth (INDEPENDENT_AMBULATORY_CARE_PROVIDER_SITE_OTHER): Payer: Self-pay

## 2013-04-24 NOTE — Telephone Encounter (Signed)
OK to renew oxycodone 5-10 by mouth every 4 hours when necessary pain #40 refill=0

## 2013-04-24 NOTE — Telephone Encounter (Signed)
Patient calling into office requesting a refill on her pain medication.  Patient s/p Lap rev gastrojejunostomy and EGD on 04/08/13.  Patient's last refill for Oxycodone 5 mg was on 04/16/13.  Patient was advised with last refill that she need's to start to taper off pain medication.  Patient reports that she's still having pain that requires pain medication.  Patient states tylenol does not seem to help.  (Dr. Lucia Gaskins not available)

## 2013-04-25 NOTE — Telephone Encounter (Signed)
Called and left message for patient to call our office.  Patient prescription for Oxycodone has been approved and will be at the front desk for pick up.

## 2013-04-28 ENCOUNTER — Ambulatory Visit: Payer: PRIVATE HEALTH INSURANCE | Admitting: Dietician

## 2013-04-30 ENCOUNTER — Ambulatory Visit (INDEPENDENT_AMBULATORY_CARE_PROVIDER_SITE_OTHER): Payer: PRIVATE HEALTH INSURANCE | Admitting: Surgery

## 2013-04-30 ENCOUNTER — Other Ambulatory Visit (INDEPENDENT_AMBULATORY_CARE_PROVIDER_SITE_OTHER): Payer: Self-pay

## 2013-04-30 VITALS — BP 124/74 | HR 115 | Temp 98.0°F | Resp 18 | Ht 65.0 in | Wt 106.4 lb

## 2013-04-30 DIAGNOSIS — D649 Anemia, unspecified: Secondary | ICD-10-CM

## 2013-04-30 DIAGNOSIS — K289 Gastrojejunal ulcer, unspecified as acute or chronic, without hemorrhage or perforation: Secondary | ICD-10-CM

## 2013-04-30 NOTE — Progress Notes (Addendum)
Brum[place], MD,  [address]  [place], [place]    [place] Phone:  [phone] FAX:  [phone]   Re:   Katherine [name] DOB:   [birth year] MRN:   [id number]  ASSESSMENT AND PLAN: 1. Gastric outlet obstruction secondary to ulcer disease and an eroded peri-anastomotic ring -  LAPAROSCOPIC REVISION OF GASTROJEJUNOSTOMY AND UPPER ENDOSCOPY - 04/08/2013 - D. Brayam Boeke   She still has right sided abdominal pain - cause unclear.  If she keeps hurting, I may need to repeat the upper endo. She is on Protonix.    Our target is to have her off of narcotics by 6 to 8 weeks post op.  I will see her back in 4 to 6 weeks. [She called and said that the Vicodin is not working.  I gave her Oxycodone (5 mg), #40.  The goal is still to have her off the narcotics at 6 - 8 weeks post op.  DN  05/01/2013]  2. 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 Timmothy Sours in [place] (was with Dewitt Hoes), but she said that he has moved.   She has not seen him in at least 5 years.  3. Malnourished.   Received TPN pre op through post op.  4. ADD - since age 53  5. Anxiety disorder  6. Hgb - 9.0 - on 04/10/2013   Will recheck CBC 7. Smokes cigarettes - approx 1 ppd   Has stopped since admission to hospital. On nicotine patch.  8. Left lower jaw abscess/inlammation   She had a root canal - but cannot remember the name of the physician who did it.   HISTORY OF PRESENT ILLNESS: Chief Complaint  Patient presents with  . Routine Post Op    sx 04-08-13 G.J anast.stricture    Katherine [name] is a 43 y.o. (DOB: [birth year])  white  female who is a patient of GREENE,JEFFREY R, MD and comes to me today for follow up of revision of the [place] of a RYGB.  She looks like she is doing well.  She has gained weight.  She has quit smoking.  She is eating okay.  She is not vomiting.  She missed the appt with the dietitian the other day, because she was late getting there. She is still  having right shoulder pain and right sided abdominal pain.    Past Medical History  Diagnosis Date  . ADD (attention deficit disorder)   . Anemia   . Blood transfusion without reported diagnosis   . Anxiety disorder     panic  . Anxiety   . MRSA (methicillin resistant Staphylococcus aureus)   . Pneumonia     [place] as child    SOCIAL HISTORY: She is living with her mother (who is a retired Marine scientist). She was managing a heating and air company, Paediatric nurse, in [place].  PHYSICAL EXAM: BP 124/74  Pulse 115  Temp(Src) 98 F (36.7 C)  Resp 18  Ht 5\' 5"  (1.651 m)  Wt 106 lb 6.4 oz (48.263 kg)  BMI 17.71 kg/m2  General: Thin white who is alert and generally healthy appearing.  HEENT: Normal. Pupils equal.  Lungs: Clear to auscultation and symmetric breath sounds. Heart:  RRR. No murmur or rub. Abdomen: Soft. Tender in right abdomen.  BS normal.  No mass. Extremities:  She showed me where she got SQ Heparin in her anterior thighs, she has small knots.  DATA REVIEWED: Epic  notes  Alphonsa Overall, MD,  [facility], [address]  [place], [place]    [place] Phone:  [phone] FAX:  [phone]

## 2013-05-01 ENCOUNTER — Encounter (INDEPENDENT_AMBULATORY_CARE_PROVIDER_SITE_OTHER): Payer: Self-pay

## 2013-05-01 ENCOUNTER — Telehealth (INDEPENDENT_AMBULATORY_CARE_PROVIDER_SITE_OTHER): Payer: Self-pay

## 2013-05-01 NOTE — Telephone Encounter (Signed)
V/M  Oxycodone 5mg  RX front desk for P/U

## 2013-05-01 NOTE — Telephone Encounter (Signed)
Patient is asking for letter to return to work half days starting May 4 and to start May 26 2013 full days.Fax to [facility] @ [phone]  She also is asking for refill of percocet ,she tried the Vicodin which upset her stomach . I advised her to eat before taking the Vicodin , she stated she tried that which did not help. I will call her back after speaking to DR. Newman this after noon

## 2013-05-08 ENCOUNTER — Telehealth (INDEPENDENT_AMBULATORY_CARE_PROVIDER_SITE_OTHER): Payer: Self-pay

## 2013-05-08 ENCOUNTER — Other Ambulatory Visit (INDEPENDENT_AMBULATORY_CARE_PROVIDER_SITE_OTHER): Payer: Self-pay

## 2013-05-08 ENCOUNTER — Encounter (HOSPITAL_COMMUNITY): Payer: Self-pay | Admitting: Emergency Medicine

## 2013-05-08 ENCOUNTER — Telehealth (INDEPENDENT_AMBULATORY_CARE_PROVIDER_SITE_OTHER): Payer: Self-pay | Admitting: Surgery

## 2013-05-08 ENCOUNTER — Emergency Department (HOSPITAL_COMMUNITY)
Admission: EM | Admit: 2013-05-08 | Discharge: 2013-05-09 | Disposition: A | Discharge status: Home / Self Care | Payer: Self-pay | Attending: Emergency Medicine | Admitting: Emergency Medicine

## 2013-05-08 ENCOUNTER — Emergency Department (HOSPITAL_COMMUNITY): Payer: Self-pay

## 2013-05-08 DIAGNOSIS — Z79899 Other long term (current) drug therapy: Secondary | ICD-10-CM | POA: Insufficient documentation

## 2013-05-08 DIAGNOSIS — R109 Unspecified abdominal pain: Secondary | ICD-10-CM

## 2013-05-08 DIAGNOSIS — D649 Anemia, unspecified: Secondary | ICD-10-CM

## 2013-05-08 DIAGNOSIS — F17200 Nicotine dependence, unspecified, uncomplicated: Secondary | ICD-10-CM | POA: Insufficient documentation

## 2013-05-08 DIAGNOSIS — F988 Other specified behavioral and emotional disorders with onset usually occurring in childhood and adolescence: Secondary | ICD-10-CM | POA: Insufficient documentation

## 2013-05-08 DIAGNOSIS — Z8614 Personal history of Methicillin resistant Staphylococcus aureus infection: Secondary | ICD-10-CM | POA: Insufficient documentation

## 2013-05-08 DIAGNOSIS — G8929 Other chronic pain: Secondary | ICD-10-CM | POA: Insufficient documentation

## 2013-05-08 DIAGNOSIS — Z9884 Bariatric surgery status: Secondary | ICD-10-CM | POA: Insufficient documentation

## 2013-05-08 DIAGNOSIS — M7989 Other specified soft tissue disorders: Secondary | ICD-10-CM | POA: Insufficient documentation

## 2013-05-08 DIAGNOSIS — Z8701 Personal history of pneumonia (recurrent): Secondary | ICD-10-CM | POA: Insufficient documentation

## 2013-05-08 DIAGNOSIS — R609 Edema, unspecified: Secondary | ICD-10-CM

## 2013-05-08 DIAGNOSIS — F411 Generalized anxiety disorder: Secondary | ICD-10-CM | POA: Insufficient documentation

## 2013-05-08 LAB — I-STAT CG4 LACTIC ACID, ED: Lactic Acid, Venous: 0.7 mmol/L (ref 0.5–2.2)

## 2013-05-08 LAB — CBC WITH DIFFERENTIAL/PLATELET
Basophils Absolute: 0 10*3/uL (ref 0.0–0.1)
Basophils Relative: 1 % (ref 0–1)
EOS PCT: 5 % (ref 0–5)
Eosinophils Absolute: 0.3 10*3/uL (ref 0.0–0.7)
HCT: 23.8 % — ABNORMAL LOW (ref 36.0–46.0)
Hemoglobin: 7.3 g/dL — ABNORMAL LOW (ref 12.0–15.0)
Lymphocytes Relative: 17 % (ref 12–46)
Lymphs Abs: 1 10*3/uL (ref 0.7–4.0)
MCH: 23.9 pg — ABNORMAL LOW (ref 26.0–34.0)
MCHC: 30.7 g/dL (ref 30.0–36.0)
MCV: 78 fL (ref 78.0–100.0)
Monocytes Absolute: 0.5 10*3/uL (ref 0.1–1.0)
Monocytes Relative: 8 % (ref 3–12)
NEUTROS ABS: 4.3 10*3/uL (ref 1.7–7.7)
Neutrophils Relative %: 70 % (ref 43–77)
PLATELETS: 272 10*3/uL (ref 150–400)
RBC: 3.05 MIL/uL — ABNORMAL LOW (ref 3.87–5.11)
RDW: 16.4 % — AB (ref 11.5–15.5)
WBC: 6.1 10*3/uL (ref 4.0–10.5)

## 2013-05-08 LAB — URINALYSIS, ROUTINE W REFLEX MICROSCOPIC
Bilirubin Urine: NEGATIVE
GLUCOSE, UA: NEGATIVE mg/dL
Hgb urine dipstick: NEGATIVE
KETONES UR: NEGATIVE mg/dL
LEUKOCYTES UA: NEGATIVE
Nitrite: NEGATIVE
PH: 5 (ref 5.0–8.0)
Protein, ur: NEGATIVE mg/dL
Specific Gravity, Urine: 1.011 (ref 1.005–1.030)
Urobilinogen, UA: 1 mg/dL (ref 0.0–1.0)

## 2013-05-08 LAB — COMPREHENSIVE METABOLIC PANEL
ALT: 46 U/L — ABNORMAL HIGH (ref 0–35)
AST: 38 U/L — AB (ref 0–37)
Albumin: 3.5 g/dL (ref 3.5–5.2)
Alkaline Phosphatase: 108 U/L (ref 39–117)
BUN: 8 mg/dL (ref 6–23)
CALCIUM: 9 mg/dL (ref 8.4–10.5)
CO2: 26 meq/L (ref 19–32)
Chloride: 99 mEq/L (ref 96–112)
Creatinine, Ser: 0.5 mg/dL (ref 0.50–1.10)
GFR calc Af Amer: >90 mL/min (ref >90)
Glucose, Bld: 60 mg/dL — ABNORMAL LOW (ref 70–99)
Potassium: 3.8 mEq/L (ref 3.7–5.3)
Sodium: 136 mEq/L — ABNORMAL LOW (ref 137–147)
Total Bilirubin: 0.3 mg/dL (ref 0.3–1.2)
Total Protein: 7.1 g/dL (ref 6.0–8.3)

## 2013-05-08 LAB — D-DIMER, QUANTITATIVE (NOT AT ARMC): D DIMER QUANT: 0.36 ug{FEU}/mL (ref 0.00–0.48)

## 2013-05-08 LAB — LIPASE, BLOOD: Lipase: 40 U/L (ref 11–59)

## 2013-05-08 MED ORDER — ENSURE COMPLETE PO LIQD
237.0000 mL | Freq: Two times a day (BID) | ORAL | Status: DC
  Administered 2013-05-09: 237 mL via ORAL
  Filled 2013-05-08 (×2): qty 237

## 2013-05-08 MED ORDER — DEXTROSE 50 % IV SOLN: 1.0000 | Freq: Once | INTRAVENOUS | Status: DC

## 2013-05-08 MED ORDER — DEXTROSE 5 % AND 0.9 % NACL IV BOLUS: 1000.0000 mL | Freq: Once | INTRAVENOUS | Status: DC

## 2013-05-08 MED ORDER — ONDANSETRON HCL 4 MG/2ML IJ SOLN
4.0000 mg | Freq: Once | INTRAMUSCULAR | Status: AC
  Administered 2013-05-08: 4 mg via INTRAVENOUS
  Filled 2013-05-08: qty 2

## 2013-05-08 MED ORDER — DEXTROSE 5 % AND 0.9 % NACL IV BOLUS
500.0000 mL | Freq: Once | INTRAVENOUS | Status: AC
  Administered 2013-05-09: 500 mL via INTRAVENOUS

## 2013-05-08 MED ORDER — MORPHINE SULFATE 4 MG/ML IJ SOLN
4.0000 mg | Freq: Once | INTRAMUSCULAR | Status: AC
  Administered 2013-05-08: 4 mg via INTRAVENOUS
  Filled 2013-05-08: qty 1

## 2013-05-08 MED ORDER — OXYCODONE-ACETAMINOPHEN 5-325 MG PO TABS: 1.0000 | ORAL_TABLET | Freq: Four times a day (QID) | ORAL | Status: DC | PRN

## 2013-05-08 NOTE — ED Notes (Signed)
MD at bedside. 

## 2013-05-08 NOTE — ED Notes (Signed)
Pt's mother Aoi Kouns called to check on pt.  She also provided medical information on the pt.  She reports that sometime last week pt was seen at [facility] for abd pain and was told that she was constipated and was sent home.  Mom reports that pt was brought to the ED 2 days later with a "total blockage" and was admitted to the ICU and "nearly died".  Mother requested to be contacted at [phone]

## 2013-05-08 NOTE — ED Notes (Signed)
Dr. Dina Rich made aware of blood sugar of 60-pt given OJ per Rica Mote RN/pt currently on liquid diet after recent revision gastric by-pass-Ensure ordered from pharmacy

## 2013-05-08 NOTE — Telephone Encounter (Signed)
V/M RX ready for P/U 05-09-13 front desk before 5p Oxycodone 5mg  one po q6hrs /PRN pain # 20 per DR. Cornett

## 2013-05-08 NOTE — Telephone Encounter (Signed)
Pt recalled office and did not go to the ER after [facility] informed her to do so. She is now stating that she is coming to the office to show [place] her legs and how swollen they are. She is also demanding that we give her pain meds. Call referred to our clinical manager.

## 2013-05-08 NOTE — ED Provider Notes (Signed)
CSN: [id number]     Arrival date & time 05/08/13  2010 History   First MD Initiated Contact with Patient 05/08/13 2056     Chief Complaint  Patient presents with  . Abdominal Pain     (Consider location/radiation/quality/duration/timing/severity/associated sxs/prior Treatment) HPI  This a 43 year old female with history of anemia, gastric bypass with recent revision, and anxiety who presents with bilateral leg swelling. Patient states that she has had leg swelling over the last 2 days. It is symmetric and bilateral. She has a recent history of gastric bypass revision and is still on a liquid diet. She states that she's been having abdominal pain on the right side of her abdomen but that is unchanged. She's not had any vomiting or diarrhea. She is concerned mostly about her lower extremity swelling. She also states that she's had difficulty concentrating today. She denies any fevers.  Past Medical History  Diagnosis Date  . ADD (attention deficit disorder)   . Anemia   . Blood transfusion without reported diagnosis   . Anxiety disorder     panic  . Anxiety   . MRSA (methicillin resistant Staphylococcus aureus)   . Pneumonia     [place] as child   Past Surgical History  Procedure Laterality Date  . Laparoscopic gastric bypass  [place], [place].  Dr Timmothy Sours  . Cesarean section    . Breast enhancement surgery    . Esophagogastroduodenoscopy N/A 03/31/2013    Procedure: ESOPHAGOGASTRODUODENOSCOPY (EGD);  Surgeon: Shann Medal, MD;  Location: [facility];  Service: General;  Laterality: N/A;  . Laparoscopic revision of gastrojejunostomy N/A 04/08/2013    Procedure: LAPAROSCOPIC REVISION OF GASTROJEJUNOSTOMY AND UPPER ENDOSCOPY;  Surgeon: Shann Medal, MD;  Location: [facility];  Service: General;  Laterality: N/A;   Family History  Problem Relation Age of Onset  . Other Other   . Other Other    History  Substance Use Topics  . Smoking status: Current Every Day Smoker -- 1.00  packs/day for 15 years    Types: Cigarettes  . Smokeless tobacco: Never Used  . Alcohol Use: No   OB History   Grav Para Term Preterm Abortions TAB SAB Ect Mult Living                 Review of Systems  Constitutional: Negative for fever.  Respiratory: Negative for cough, chest tightness and shortness of breath.   Cardiovascular: Positive for leg swelling. Negative for chest pain and palpitations.  Gastrointestinal: Positive for abdominal pain. Negative for nausea and vomiting.  Genitourinary: Negative for dysuria.  Musculoskeletal: Negative for back pain.  Skin: Negative for rash.  Neurological: Negative for headaches.  Psychiatric/Behavioral: Negative for confusion. The patient is nervous/anxious.   All other systems reviewed and are negative.     Allergies  Nsaids; Promethazine hcl; and Tramadol  Home Medications   Prior to Admission medications   Medication Sig Start Date End Date Taking? Authorizing Provider  amphetamine-dextroamphetamine (ADDERALL) 30 MG tablet Take 1 tablet (30 mg total) by mouth 2 (two) times daily. 12/06/12  Yes [name], MD  HYDROcodone-acetaminophen (NORCO/VICODIN) 5-325 MG per tablet Take 1 tablet by mouth every 6 (six) hours as needed for moderate pain.  04/30/13  Yes Historical Provider, MD  Multiple Vitamin (MULTIVITAMIN WITH MINERALS) TABS tablet Take 1 tablet by mouth daily.   Yes Historical Provider, MD  ondansetron (ZOFRAN) 4 MG tablet Take 1 tablet (4 mg total) by mouth every 6 (six) hours.  03/27/13  Yes Shari A Upstill, PA-C  oxyCODONE (OXY IR/ROXICODONE) 5 MG immediate release tablet Take 5 mg by mouth every 6 (six) hours as needed.  05/01/13  Yes Historical Provider, MD  oxyCODONE-acetaminophen (PERCOCET/ROXICET) 5-325 MG per tablet Take 1 tablet by mouth every 6 (six) hours as needed for severe pain (# 20 per Dr. Brantley Stage). 05/08/13  Yes Thomas A. Cornett, MD  pantoprazole (PROTONIX) 40 MG tablet  04/12/13  Yes Historical Provider, MD   sertraline (ZOLOFT) 50 MG tablet Take 1 tablet (50 mg total) by mouth daily. 12/06/12  Yes [name], MD  vitamin B-12 (CYANOCOBALAMIN) 250 MCG tablet Take 250 mcg by mouth daily.   Yes Historical Provider, MD  [employer] NICOTINE 7 MG/24HR patch Place 7 mg onto the skin daily.  04/12/13   Historical Provider, MD   BP 95/59  Pulse 87  Temp(Src) 98.1 F (36.7 C) (Oral)  Resp 20  SpO2 97%  LMP 03/17/2013 Physical Exam  Nursing note and vitals reviewed. Constitutional: She is oriented to person, place, and time.  Thin, chronically ill-appearing  HENT:  Head: Normocephalic and atraumatic.  mucous membranes dry  Eyes: Pupils are equal, round, and reactive to light.  Neck: Neck supple.  Cardiovascular: Normal rate, regular rhythm and normal heart sounds.   No murmur heard. Pulmonary/Chest: Effort normal and breath sounds normal. No respiratory distress. She has no wheezes.  Abdominal: Soft. She exhibits no distension. There is no tenderness. There is no rebound and no guarding.  Hyperactive bowel sounds, well healing laparoscopy scars  Musculoskeletal:  2+ bilateral lower extremity edema, neg homan's sign  Neurological: She is alert and oriented to person, place, and time.  Skin: Skin is warm and dry.  Psychiatric:  Strange affect    ED Course  Procedures (including critical care time) Labs Review Labs Reviewed  CBC WITH DIFFERENTIAL - Abnormal; Notable for the following:    RBC 3.05 (*)    Hemoglobin 7.3 (*)    HCT 23.8 (*)    MCH 23.9 (*)    RDW 16.4 (*)    All other components within normal limits  COMPREHENSIVE METABOLIC PANEL - Abnormal; Notable for the following:    Sodium 136 (*)    Glucose, Bld 60 (*)    AST 38 (*)    ALT 46 (*)    All other components within normal limits  CBG MONITORING, ED - Abnormal; Notable for the following:    Glucose-Capillary 290 (*)    All other components within normal limits  LIPASE, BLOOD  URINALYSIS, ROUTINE W REFLEX MICROSCOPIC   D-DIMER, QUANTITATIVE  I-STAT CG4 LACTIC ACID, ED    Imaging Review Dg Abd 1 View  05/08/2013   CLINICAL DATA:  Abdominal pain.  EXAM: ABDOMEN - 1 VIEW  COMPARISON:  CT of the abdomen and pelvis from 03/27/2013  FINDINGS: The visualized bowel gas pattern is unremarkable. Scattered air and stool filled loops of colon are seen; no abnormal dilatation of small bowel loops is seen to suggest small bowel obstruction. No free intra-abdominal air is identified, though evaluation for free air is limited on a single supine view. Bowel suture lines are noted at the left upper quadrant.  The visualized osseous structures are within normal limits; the sacroiliac joints are unremarkable in appearance.  IMPRESSION: Unremarkable bowel gas pattern; no free intra-abdominal air seen. Relatively small amount of stool noted in the colon.   Electronically Signed   By: Garald Balding M.D.   On: 05/08/2013 22:10  EKG Interpretation None      MDM   Final diagnoses:  Abdominal pain  Dependent edema  Anemia    Patient presents with bilateral leg swelling and chronic abdominal pain. She is nontoxic-appearing but chronically ill and thin.  Abdominal exam is benign.  Patient does have bilateral lower extremity edema which is symmetric. Low suspicion at this time for DVT but given recent surgery will obtain d-dimer. Dimer is negative.  Lab work up is notable for anemia with hemoglobin of 7.9. Postop patient's hemoglobin was approximately 8.5. She does not appear be orthostatic and has had issues with anemia in the past.  Glucose also noted to be 60. Patient is still on a liquid diet. Patient was given fluids with glucose in them as well as Ensure. Repeat CBG will be obtained. KUB shows no evidence of obstruction and given reassuring abdominal exam, no indication for imaging at this time. Discussed treatment results with patient. She will followup in surgery clinic.  Patient has Rx for pain medication at surgery clinic  and was informed of this.   Merryl Hacker, MD 05/09/13 [phone]

## 2013-05-08 NOTE — ED Notes (Signed)
Patient arrived from ED Triage Patient hysterically crying and talking to visitor who is attempting to get patient undressed and into gown Speech is noted to be slurred, but patient is able to answer questions appropriately and is alert and oriented x 4 Patient noted to be taking Suboxone, Vicodin and Percocet at home

## 2013-05-08 NOTE — Telephone Encounter (Signed)
Pt called and spoke with Jeralyn Ruths, [place]. She was hysterical and yelling. Bernie forwarded the phone call to me.  Ms. Bickert continued yelling and sounded hysterical while telling me that her legs were hurting her. I told her that she needed to go to the ER right away.  She then stated that she did not want to go to the ER and sit in the waiting room with a bunch of loosers.  Plus she had to pick up her child. I explained that we could not do anything for her legs her and that she may have a blood clot and needed attention right away.  She then states that she is also hurting from her abdominal surgery and is out of pain medicine. She states that she called yesterday and spoke with [facility], there is no documentation of this.  I explained that we could ask the doctor to refill her rx for her but she still needed to go to the ER.  She starts crying and states that we just don't understand and that no one does.  I explained that we only had her best interest in mind that is why we need her to go to the ER.  She stated that she was going to come here to the office and sit in the lobby until someone looked at her legs.  She then proceeds to curse at me and when I asked her to please not use such language she stated that I thought I was perfect.  I told her that if she would like to talk about this further she could call back when she was not as hysterical.  She asked for my name and title which I gave to her then politely hung up the phone.   Ms. Servais called back to the front desk and asked for my name and title and stated that she was going to come up to the office and sit in the lobby.  I spoke with Jaquelyn Bitter regarding this situation and she stated that if Ms. Broski came into the office that we are to call security.

## 2013-05-08 NOTE — Telephone Encounter (Signed)
Pt of Dr. Lucia Gaskins calling hysterical because her legs are extremely swollen.  She spoke with [facility] yesterday about pain medication and says she mentioned that her legs were beginning to swell. No documentation of this.  Pt advised to go directly to her PCP to be evaluated. If unable to see him she was instructed to go directly to the ER.  Explained she would need to be assessed today.  Tried to explain that there was nothing we could do for her here, and Dr. Lucia Gaskins is out of the office.  Pt said she was going directly to her Urgent Care.

## 2013-05-08 NOTE — ED Notes (Addendum)
Pt states she had a revision of her gastric bypass in March  Pt states now she is having a lot of abd pain  Pt denies vomiting  Pt states she called the dr office and was told to come here for evaluation  Pt has slurred speech and cannot focus in conversation  Pt is answering questions inappropriately

## 2013-05-09 ENCOUNTER — Ambulatory Visit (HOSPITAL_COMMUNITY)
Admission: RE | Admit: 2013-05-09 | Discharge: 2013-05-09 | Disposition: A | Discharge status: Home / Self Care | Payer: PRIVATE HEALTH INSURANCE | Source: Ambulatory Visit | Attending: Family Medicine | Admitting: Family Medicine

## 2013-05-09 ENCOUNTER — Telehealth (INDEPENDENT_AMBULATORY_CARE_PROVIDER_SITE_OTHER): Payer: Self-pay | Admitting: Surgery

## 2013-05-09 DIAGNOSIS — Z5189 Encounter for other specified aftercare: Secondary | ICD-10-CM

## 2013-05-09 DIAGNOSIS — M7989 Other specified soft tissue disorders: Secondary | ICD-10-CM

## 2013-05-09 DIAGNOSIS — Z9884 Bariatric surgery status: Secondary | ICD-10-CM | POA: Insufficient documentation

## 2013-05-09 HISTORY — DX: Encounter for other specified aftercare: Z51.89

## 2013-05-09 LAB — CBG MONITORING, ED: GLUCOSE-CAPILLARY: 290 mg/dL — AB (ref 70–99)

## 2013-05-09 NOTE — ED Provider Notes (Signed)
1:52 AM Katherine [name] was concerned about her hemoglobin.  I went and saw the patient and spoke with the patient's mother as well as the patient.  She presents with lower extremity edema consistent with likely venous stasis peripheral edema.  I recommended elevation compression stockings.  She had a negative d-dimer the patient and family would feel better she has bilateral lower extremity duplexes.  He's been scheduled for later this morning.  She does have a hemoglobin is 7.3 but historically she has always been in the 8-9.  Her last hemoglobin was in the mid-8 range.  She is not symptomatic at this time.  She has no exertional shortness of breath.  She's no syncope.  She seems rather anxious at this time.  Vas that she followup with her primary care physician at [facility] urgent care tomorrow for recheck of her hemoglobin.  She has no melena or hematochezia.  She may benefit from GI referral for possible endoscopy if she continues to have persistent anemia  Hoy Morn, MD 05/09/13 [phone]

## 2013-05-09 NOTE — Telephone Encounter (Signed)
Katherine [name]  July 02, 1970 [id number]  Patient Care Team: Wendie Agreste, MD as PCP - General (Family Medicine) [name], MD as Consulting Physician (Family Medicine)  This patient is a 43 y.o.female who calls today for surgical evaluation.   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  PROCEDURE: Laparoscopic revision of gastrojejunostomy with upper endoscopy.  SURGEON: Fenton Malling. Katherine [name], M.D.  FIRST ASSISTANT: Katherine [name] T. Hoxworth, M.D.; Dr. Excell Seltzer did the upper endoscopy.   Reason for call: Leg swelling  I was called by the patient's mother.  Mother at home.  Patient in the emergency room.  She has a month out from revisional bariatric surgery for eroded ring.  She was severely malnourished and deconditioned.  Prolonged hospital stay.  Been gaining weight.  Seen in the office since then.  However, bilateral leg swelling noted.  Mother told her that doubled.  Tried to see us/[facility].  We recommended discussed with primary care physician first. Martin Majestic to the emergency room in frustration.  I spent 10 minutes discussing with the mother her concerns, offering differential diagnosis.  I explained the importance of having medicine involvement should there be evidence of DVT his status more area of expertise.  The mother seemed reassured by the end of the conversation.  I later got called by the emergency room physician, Dr Dina Rich.  Apparently it looks like pedal edema.  D-dimer negative, arguing against DVT.  Duplex studies have not been done yet.  No severe retching or nausea vomiting or abdominal pain.  They felt she most likely to go home.  Patient Active Problem List   Diagnosis Date Noted  . Tobacco abuse 04/05/2013  . Cachexia 04/05/2013  .  S/P gastric bypass 2007 04/04/2013  . 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 Protein-calorie malnutrition, severe 04/01/2013  . Vomiting 03/30/2013  . Facial cellulitis 03/21/2012  . Anxiety disorder     Past Medical History  Diagnosis Date  . ADD (attention deficit disorder)   . Anemia   . Blood transfusion without reported diagnosis   . Anxiety disorder     panic  . Anxiety   . MRSA (methicillin resistant Staphylococcus aureus)   . Pneumonia     [place] as child    Past Surgical History  Procedure Laterality Date  . Laparoscopic gastric bypass  [place], [place].  Dr Timmothy Sours  . Cesarean section    . Breast enhancement surgery    . Esophagogastroduodenoscopy N/A 03/31/2013    Procedure: ESOPHAGOGASTRODUODENOSCOPY (EGD);  Surgeon: Shann Medal, MD;  Location: [facility];  Service: General;  Laterality: N/A;  . Laparoscopic revision of gastrojejunostomy N/A 04/08/2013    Procedure: LAPAROSCOPIC REVISION OF GASTROJEJUNOSTOMY AND UPPER ENDOSCOPY;  Surgeon: Shann Medal, MD;  Location: [facility];  Service: General;  Laterality: N/A;    History   Social History  . Marital Status: Legally Separated    Spouse Name: N/A    Number of Children: N/A  . Years of Education: N/A   Occupational History  . Not on file.   Social History Main Topics  . Smoking status: Current Every Day Smoker -- 1.00 packs/day for 15 years    Types: Cigarettes  . Smokeless tobacco: Never Used  . Alcohol Use: No  . Drug Use: No  . Sexual Activity: Not on file  Other Topics Concern  . Not on file   Social History Narrative  . No narrative on file    Family History  Problem Relation Age of Onset  . Other Other   . Other Other     Current Outpatient Prescriptions  Medication Sig Dispense Refill  . amphetamine-dextroamphetamine (ADDERALL) 30 MG tablet Take 1 tablet (30 mg total) by mouth 2 (two) times daily.  60 tablet  0  . [facility] NICOTINE 7 MG/24HR patch Place 7 mg  onto the skin daily.       Katherine [name] HYDROcodone-acetaminophen (NORCO/VICODIN) 5-325 MG per tablet Take 1 tablet by mouth every 6 (six) hours as needed for moderate pain.       . Multiple Vitamin (MULTIVITAMIN WITH MINERALS) TABS tablet Take 1 tablet by mouth daily.      . ondansetron (ZOFRAN) 4 MG tablet Take 1 tablet (4 mg total) by mouth every 6 (six) hours.  12 tablet  0  . oxyCODONE (OXY IR/ROXICODONE) 5 MG immediate release tablet Take 5 mg by mouth every 6 (six) hours as needed.       Katherine [name] oxyCODONE-acetaminophen (PERCOCET/ROXICET) 5-325 MG per tablet Take 1 tablet by mouth every 6 (six) hours as needed for severe pain (# 20 per Dr. Brantley Stage).  20 tablet  0  . pantoprazole (PROTONIX) 40 MG tablet       . sertraline (ZOLOFT) 50 MG tablet Take 1 tablet (50 mg total) by mouth daily.  30 tablet  5  . vitamin B-12 (CYANOCOBALAMIN) 250 MCG tablet Take 250 mcg by mouth daily.       No current facility-administered medications for this visit.   Facility-Administered Medications Ordered in Other Visits  Medication Dose Route Frequency Provider Last Rate Last Dose  . feeding supplement (ENSURE COMPLETE) (ENSURE COMPLETE) liquid 237 mL  237 mL Oral BID BM Merryl Hacker, MD   237 mL at 05/09/13 0003     Allergies  Allergen Reactions  . Nsaids     Anemia, and Gastric bypass  . Promethazine Hcl Other (See Comments)    Agitated, restless  . Tramadol Rash    @VS @  Dg Abd 1 View  05/08/2013   CLINICAL DATA:  Abdominal pain.  EXAM: ABDOMEN - 1 VIEW  COMPARISON:  CT of the abdomen and pelvis from 03/27/2013  FINDINGS: The visualized bowel gas pattern is unremarkable. Scattered air and stool filled loops of colon are seen; no abnormal dilatation of small bowel loops is seen to suggest small bowel obstruction. No free intra-abdominal air is identified, though evaluation for free air is limited on a single supine view. Bowel suture lines are noted at the left upper quadrant.  The visualized osseous structures  are within normal limits; the sacroiliac joints are unremarkable in appearance.  IMPRESSION: Unremarkable bowel gas pattern; no free intra-abdominal air seen. Relatively small amount of stool noted in the colon.   Electronically Signed   By: Garald Balding M.D.   On: 05/08/2013 22:10   Dg Ugi W/water Sol Cm  04/09/2013   CLINICAL DATA:  43 year old female status post gastrojejunostomy revision 1 day ago. Initial encounter.  EXAM: WATER SOLUBLE UPPER GI SERIES  TECHNIQUE: Single-column upper GI series was performed using water soluble contrast.  CONTRAST:  33mL OMNIPAQUE IOHEXOL 300 MG/ML  SOLN  COMPARISON:  Preoperative upper GI 04/01/2013.  FLUOROSCOPY TIME:  1 min 36 seconds  FINDINGS: The patient was first given a small sip of water p.o. and tolerated it without difficulty.  The patient tolerated about 30 mL of Omnipaque 300 contrast well, taken in several small sips.  No obstruction to the forward flow of contrast throughout the esophagus and to the stomach.  Initially the small volume of contrast opacified the gastric fundus and stopped. 10 min later, contrasted passed through the gastrojejunostomy and outlined 1 or 2 proximal small bowel loops. The patient was asymptomatic. No abnormal collection of contrast is identified.  IMPRESSION: Patent gastrojejunostomy, with mildly delayed contrast passage likely due to postoperative edema. No adverse features.   Electronically Signed   By: Lars Pinks M.D.   On: 04/09/2013 12:05    Note: This dictation was prepared with Dragon/digital dictation along with [employer]. Any transcriptional errors that result from this process are unintentional.

## 2013-05-09 NOTE — Progress Notes (Signed)
VASCULAR LAB PRELIMINARY  PRELIMINARY  PRELIMINARY  PRELIMINARY  Bilateral lower extremity venous duplex  completed.    Preliminary report:  Bilateral:  No evidence of DVT, superficial thrombosis, or Baker's Cyst.    Nani Ravens, RVT 05/09/2013, 5:47 PM

## 2013-05-09 NOTE — Discharge Instructions (Signed)
IMPORTANT PATIENT INSTRUCTIONS:   You have been scheduled for an Outpatient Vascular Study at [facility].    If tomorrow is a Saturday or Sunday, please go to the [facility] Emergency Department registration desk at 8 AM tomorrow morning and tell them you are therefore a vascular study.  If tomorrow is a weekday (Monday - Friday), please go to the [facility] Admitting Department at 8 AM and tell them you are therefore a vascular study     Anemia, Nonspecific  Your anemia is likely secondary to recent operation.  You need to follow-up for repeat labwork.    Anemia is a condition in which the concentration of red blood cells or hemoglobin in the blood is below normal. Hemoglobin is a substance in red blood cells that carries oxygen to the tissues of the body. Anemia results in not enough oxygen reaching these tissues.  CAUSES  Common causes of anemia include:   Excessive bleeding. Bleeding may be internal or external. This includes excessive bleeding from periods (in women) or from the intestine.   Poor nutrition.   Chronic kidney, thyroid, and liver disease.  Bone marrow disorders that decrease red blood cell production.  Cancer and treatments for cancer.  HIV, AIDS, and their treatments.  Spleen problems that increase red blood cell destruction.  Blood disorders.  Excess destruction of red blood cells due to infection, medicines, and autoimmune disorders. SIGNS AND SYMPTOMS   Minor weakness.   Dizziness.   Headache.  Palpitations.   Shortness of breath, especially with exercise.   Paleness.  Cold sensitivity.  Indigestion.  Nausea.  Difficulty sleeping.  Difficulty concentrating. Symptoms may occur suddenly or they may develop slowly.  DIAGNOSIS  Additional blood tests are often needed. These help your health care provider determine the best treatment. Your health care provider will check your stool for blood and look for other causes of  blood loss.  TREATMENT  Treatment varies depending on the cause of the anemia. Treatment can include:   Supplements of iron, vitamin R67, or folic acid.   Hormone medicines.   A blood transfusion. This may be needed if blood loss is severe.   Hospitalization. This may be needed if there is significant continual blood loss.   Dietary changes.  Spleen removal. HOME CARE INSTRUCTIONS Keep all follow-up appointments. It often takes many weeks to correct anemia, and having your health care provider check on your condition and your response to treatment is very important. SEEK IMMEDIATE MEDICAL CARE IF:   You develop extreme weakness, shortness of breath, or chest pain.   You become dizzy or have trouble concentrating.  You develop heavy vaginal bleeding.   You develop a rash.   You have bloody or black, tarry stools.   You faint.   You vomit up blood.   You vomit repeatedly.   You have abdominal pain.  You have a fever or persistent symptoms for more than 2 3 days.   You have a fever and your symptoms suddenly get worse.   You are dehydrated.  MAKE SURE YOU:  Understand these instructions.  Will watch your condition.  Will get help right away if you are not doing well or get worse. Document Released: 02/03/2004 Document Revised: 08/28/2012 Document Reviewed: 06/21/2012 [facility] Patient Information 2014 [place]. Peripheral Edema You need to elevate you lower extremities and use compression socks.  You have swelling in your legs (peripheral edema). This swelling is due to excess accumulation of salt and  water in your body. Edema may be a sign of heart, kidney or liver disease, or a side effect of a medication. It may also be due to problems in the leg veins. Elevating your legs and using special support stockings may be very helpful, if the cause of the swelling is due to poor venous circulation. Avoid long periods of standing, whatever the  cause. Treatment of edema depends on identifying the cause. Chips, pretzels, pickles and other salty foods should be avoided. Restricting salt in your diet is almost always needed. Water pills (diuretics) are often used to remove the excess salt and water from your body via urine. These medicines prevent the kidney from reabsorbing sodium. This increases urine flow. Diuretic treatment may also result in lowering of potassium levels in your body. Potassium supplements may be needed if you have to use diuretics daily. Daily weights can help you keep track of your progress in clearing your edema. You should call your caregiver for follow up care as recommended. SEEK IMMEDIATE MEDICAL CARE IF:   You have increased swelling, pain, redness, or heat in your legs.  You develop shortness of breath, especially when lying down.  You develop chest or abdominal pain, weakness, or fainting.  You have a fever. Document Released: 02/03/2004 Document Revised: 03/20/2011 Document Reviewed: 01/13/2009 [facility] Patient Information 2014 [place].

## 2013-05-11 ENCOUNTER — Encounter (HOSPITAL_COMMUNITY): Payer: Self-pay | Admitting: Emergency Medicine

## 2013-05-11 ENCOUNTER — Ambulatory Visit (INDEPENDENT_AMBULATORY_CARE_PROVIDER_SITE_OTHER): Payer: PRIVATE HEALTH INSURANCE | Admitting: Emergency Medicine

## 2013-05-11 VITALS — BP 100/62 | HR 84 | Temp 97.4°F | Resp 16 | Ht 64.0 in | Wt 115.2 lb

## 2013-05-11 DIAGNOSIS — Z681 Body mass index (BMI) 19 or less, adult: Secondary | ICD-10-CM

## 2013-05-11 DIAGNOSIS — E43 Unspecified severe protein-calorie malnutrition: Secondary | ICD-10-CM | POA: Diagnosis present

## 2013-05-11 DIAGNOSIS — D649 Anemia, unspecified: Secondary | ICD-10-CM

## 2013-05-11 DIAGNOSIS — K289 Gastrojejunal ulcer, unspecified as acute or chronic, without hemorrhage or perforation: Secondary | ICD-10-CM | POA: Diagnosis present

## 2013-05-11 DIAGNOSIS — Z9884 Bariatric surgery status: Secondary | ICD-10-CM

## 2013-05-11 DIAGNOSIS — Z87891 Personal history of nicotine dependence: Secondary | ICD-10-CM

## 2013-05-11 DIAGNOSIS — Z79899 Other long term (current) drug therapy: Secondary | ICD-10-CM

## 2013-05-11 DIAGNOSIS — R4182 Altered mental status, unspecified: Secondary | ICD-10-CM

## 2013-05-11 DIAGNOSIS — R609 Edema, unspecified: Secondary | ICD-10-CM

## 2013-05-11 DIAGNOSIS — F411 Generalized anxiety disorder: Secondary | ICD-10-CM | POA: Diagnosis present

## 2013-05-11 DIAGNOSIS — D509 Iron deficiency anemia, unspecified: Principal | ICD-10-CM | POA: Diagnosis present

## 2013-05-11 LAB — COMPREHENSIVE METABOLIC PANEL
ALT: 31 U/L (ref 0–35)
AST: 29 U/L (ref 0–37)
Albumin: 3.7 g/dL (ref 3.5–5.2)
Alkaline Phosphatase: 88 U/L (ref 39–117)
BUN: 8 mg/dL (ref 6–23)
CALCIUM: 8.7 mg/dL (ref 8.4–10.5)
CO2: 26 meq/L (ref 19–32)
CREATININE: 0.52 mg/dL (ref 0.50–1.10)
Chloride: 102 mEq/L (ref 96–112)
GLUCOSE: 79 mg/dL (ref 70–99)
Potassium: 4.1 mEq/L (ref 3.5–5.3)
Sodium: 136 mEq/L (ref 135–145)
TOTAL PROTEIN: 6.2 g/dL (ref 6.0–8.3)
Total Bilirubin: 0.3 mg/dL (ref 0.2–1.2)

## 2013-05-11 LAB — POCT CBC
Granulocyte percent: 60.6 % (ref 37–80)
HCT, POC: 22.9 % — AB (ref 37.7–47.9)
Hemoglobin: 6.7 g/dL — AB (ref 12.2–16.2)
Lymph, poc: 1.3 (ref 0.6–3.4)
MCH, POC: 22.9 pg — AB (ref 27–31.2)
MCHC: 29.3 g/dL — AB (ref 31.8–35.4)
MCV: 78.2 fL — AB (ref 80–97)
MID (cbc): 0.4 (ref 0–0.9)
MPV: 6.5 fL (ref 0–99.8)
POC Granulocyte: 2.5 (ref 2–6.9)
POC LYMPH PERCENT: 30.8 % (ref 10–50)
POC MID %: 8.6 % (ref 0–12)
Platelet Count, POC: 284 10*3/uL (ref 142–424)
RBC: 2.93 M/uL — AB (ref 4.04–5.48)
RDW, POC: 17.8 %
WBC: 4.2 10*3/uL — AB (ref 4.6–10.2)

## 2013-05-11 LAB — GLUCOSE, POCT (MANUAL RESULT ENTRY): POC Glucose: 92 mg/dl (ref 70–99)

## 2013-05-11 LAB — D-DIMER, QUANTITATIVE (NOT AT ARMC): D DIMER QUANT: 1.19 ug{FEU}/mL — AB (ref 0.00–0.48)

## 2013-05-11 MED ORDER — HYDROCHLOROTHIAZIDE 25 MG PO TABS
25.0000 mg | ORAL_TABLET | Freq: Every day | ORAL | Status: DC
Start: 2013-05-11 — End: 2013-05-13

## 2013-05-11 NOTE — Progress Notes (Addendum)
[facility] [address], [place] [address] [phone]  Date:  05/11/2013   Name:  Katherine [name]   DOB:  02-16-1970   MRN:  [id number]  PCP:  Wendie Agreste, MD    Chief Complaint: Leg Swelling   History of Present Illness:  Katherine [name] is a 43 y.o. very pleasant female patient who presents with the following:  Patient has a history of surgery for gastrojejunal anastomotic stricture secondary to ulcer disease and eroded peri-anastomotic ring.  04/08/13.  Complication from gastric diversion procedure.  Developed moderate peripheral edema on Wednesday and was evaluated in the ER and a DVT was excluded.  She was found to be anemic (not a new problem) with a FBS of 60.  She denies nausea or vomiting or stool change.  She is still on a liquid diet.  She is on no medications that would alter capillary permeability.  She says her edema has improved with elevation.   No chest pain, shortness of breath or wheezing.  Says since her er visit last week she has experienced a bit of mental confusion that is of variable intensity and intermittent in nature. Mostly affects her ability to do math in her head.  No improvement with over the counter medications or other home remedies. Denies other complaint or health concern today.    Patient Active Problem List   Diagnosis Date Noted  . Tobacco abuse 04/05/2013  . Cachexia 04/05/2013  . S/P gastric bypass 2007 04/04/2013  . 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 Protein-calorie malnutrition, severe 04/01/2013  . Vomiting 03/30/2013  . Facial cellulitis 03/21/2012  . Anxiety disorder     Past Medical History  Diagnosis Date  . ADD (attention deficit disorder)   . Anemia   . Blood transfusion without reported diagnosis   . Anxiety disorder     panic  . Anxiety   . MRSA (methicillin resistant Staphylococcus aureus)   . Pneumonia     [place] as child    Past Surgical History  Procedure  Laterality Date  . Laparoscopic gastric bypass  [place], [place].  Dr Timmothy Sours  . Cesarean section    . Breast enhancement surgery    . Esophagogastroduodenoscopy N/A 03/31/2013    Procedure: ESOPHAGOGASTRODUODENOSCOPY (EGD);  Surgeon: Shann Medal, MD;  Location: [facility];  Service: General;  Laterality: N/A;  . Laparoscopic revision of gastrojejunostomy N/A 04/08/2013    Procedure: LAPAROSCOPIC REVISION OF GASTROJEJUNOSTOMY AND UPPER ENDOSCOPY;  Surgeon: Shann Medal, MD;  Location: [facility];  Service: General;  Laterality: N/A;    History  Substance Use Topics  . Smoking status: Former Smoker -- 1.00 packs/day for 15 years    Types: Cigarettes    Quit date: 03/30/2013  . Smokeless tobacco: Never Used     Comment: patient started patches in the hospital 03/30/13  . Alcohol Use: No    Family History  Problem Relation Age of Onset  . Other Other   . Other Other     Allergies  Allergen Reactions  . Nsaids     Anemia, and Gastric bypass  . Promethazine Hcl Other (See Comments)    Agitated, restless  . Tramadol Rash    Medication list has been reviewed and updated.  Current Outpatient Prescriptions on File Prior to Visit  Medication Sig Dispense Refill  . amphetamine-dextroamphetamine (ADDERALL) 30 MG tablet Take 1 tablet (30 mg total) by mouth 2 (two)  times daily.  60 tablet  0  . [facility]S NICOTINE 7 MG/24HR patch Place 7 mg onto the skin daily.       . Multiple Vitamin (MULTIVITAMIN WITH MINERALS) TABS tablet Take 1 tablet by mouth daily.      . ondansetron (ZOFRAN) 4 MG tablet Take 1 tablet (4 mg total) by mouth every 6 (six) hours.  12 tablet  0  . oxyCODONE (OXY IR/ROXICODONE) 5 MG immediate release tablet Take 5 mg by mouth every 6 (six) hours as needed.       . pantoprazole (PROTONIX) 40 MG tablet       . sertraline (ZOLOFT) 50 MG tablet Take 1 tablet (50 mg total) by mouth daily.  30 tablet  5  . HYDROcodone-acetaminophen (NORCO/VICODIN) 5-325 MG per tablet Take 1  tablet by mouth every 6 (six) hours as needed for moderate pain.       Marland Kitchen oxyCODONE-acetaminophen (PERCOCET/ROXICET) 5-325 MG per tablet Take 1 tablet by mouth every 6 (six) hours as needed for severe pain (# 20 per Dr. Brantley Stage).  20 tablet  0  . vitamin B-12 (CYANOCOBALAMIN) 250 MCG tablet Take 250 mcg by mouth daily.       No current facility-administered medications on file prior to visit.    Review of Systems:  As per HPI, otherwise negative.    Physical Examination: Filed Vitals:   05/11/13 1750  BP: 100/62  Pulse: 84  Temp: 97.4 F (36.3 C)  Resp: 16   Filed Vitals:   05/11/13 1750  Height: 5\' 4"  (1.626 m)  Weight: 115 lb 3.2 oz (52.254 kg)   Body mass index is 19.76 kg/(m^2). Ideal Body Weight: Weight in (lb) to have BMI = 25: 145.3  GEN: WDWN, NAD, Non-toxic, A & O x 3 HEENT: Atraumatic, Normocephalic. Neck supple. No masses, No LAD. Ears and Nose: No external deformity. CV: RRR, No M/G/R. No JVD. No thrill. No extra heart sounds. PULM: CTA B, no wheezes, crackles, rhonchi. No retractions. No resp. distress. No accessory muscle use. ABD: S, NT, ND, +BS. No rebound. No HSM. EXTR: No c/c/  +2  NEURO Normal gait.  PSYCH: Normally interactive. Conversant. Not depressed or anxious appearing.  Calm demeanor.    Assessment and Plan: Mental status change Peripheral edema anemia Labs  Ct Neuro consult HCTZ ?GI consultation  Signed,  [name], MD    Results for orders placed in visit on 05/11/13  POCT CBC      Result Value Ref Range   WBC 4.2 (*) 4.6 - 10.2 K/uL   Lymph, poc 1.3  0.6 - 3.4   POC LYMPH PERCENT 30.8  10 - 50 %L   MID (cbc) 0.4  0 - 0.9   POC MID % 8.6  0 - 12 %M   POC Granulocyte 2.5  2 - 6.9   Granulocyte percent 60.6  37 - 80 %G   RBC 2.93 (*) 4.04 - 5.48 M/uL   Hemoglobin 6.7 (*) 12.2 - 16.2 g/dL   HCT, POC 22.9 (*) 37.7 - 47.9 %   MCV 78.2 (*) 80 - 97 fL   MCH, POC 22.9 (*) 27 - 31.2 pg   MCHC 29.3 (*) 31.8 - 35.4 g/dL    RDW, POC 17.8     Platelet Count, POC 284  142 - 424 K/uL   MPV 6.5  0 - 99.8 fL  GLUCOSE, POCT (MANUAL RESULT ENTRY)      Result Value Ref Range   POC  Glucose 92  70 - 99 mg/dl

## 2013-05-11 NOTE — ED Notes (Signed)
The pt has had swollen legs bi-laterally since Thursday.  She had some type surgery at [facility] long march 30th for a bowel blockage.  She had an [place] on Friday  That dis not show a dvt.  However she was seen at [facility] urgent care earlier today and the doctor reviewed her chart and she was called at home and told that she did have a clot and to come to the ed.  She had an elevated d-dimer

## 2013-05-11 NOTE — Addendum Note (Signed)
Addended by: Roselee Culver on: 05/11/2013 07:11 PM   Modules accepted: Orders

## 2013-05-12 ENCOUNTER — Inpatient Hospital Stay (HOSPITAL_COMMUNITY)
Admission: EM | Admit: 2013-05-12 | Discharge: 2013-05-13 | DRG: 811 | Disposition: A | Discharge status: Home / Self Care | Payer: PRIVATE HEALTH INSURANCE | Attending: Family Medicine | Admitting: Family Medicine

## 2013-05-12 ENCOUNTER — Encounter (HOSPITAL_COMMUNITY)
Admission: EM | Disposition: A | Discharge status: Home / Self Care | Payer: Self-pay | Source: Home / Self Care | Attending: Family Medicine

## 2013-05-12 ENCOUNTER — Encounter (HOSPITAL_COMMUNITY): Payer: Self-pay | Admitting: General Surgery

## 2013-05-12 DIAGNOSIS — R609 Edema, unspecified: Secondary | ICD-10-CM

## 2013-05-12 DIAGNOSIS — K289 Gastrojejunal ulcer, unspecified as acute or chronic, without hemorrhage or perforation: Secondary | ICD-10-CM

## 2013-05-12 DIAGNOSIS — R600 Localized edema: Secondary | ICD-10-CM

## 2013-05-12 DIAGNOSIS — Z720 Tobacco use: Secondary | ICD-10-CM

## 2013-05-12 DIAGNOSIS — R64 Cachexia: Secondary | ICD-10-CM

## 2013-05-12 DIAGNOSIS — F419 Anxiety disorder, unspecified: Secondary | ICD-10-CM

## 2013-05-12 DIAGNOSIS — D649 Anemia, unspecified: Secondary | ICD-10-CM

## 2013-05-12 DIAGNOSIS — K284 Chronic or unspecified gastrojejunal ulcer with hemorrhage: Secondary | ICD-10-CM

## 2013-05-12 DIAGNOSIS — K274 Chronic or unspecified peptic ulcer, site unspecified, with hemorrhage: Secondary | ICD-10-CM

## 2013-05-12 HISTORY — DX: Chronic or unspecified gastrojejunal ulcer with hemorrhage: K28.4

## 2013-05-12 HISTORY — DX: Chronic or unspecified peptic ulcer, site unspecified, with hemorrhage: K27.4

## 2013-05-12 HISTORY — PX: ESOPHAGOGASTRODUODENOSCOPY: SHX5428

## 2013-05-12 LAB — PREPARE RBC (CROSSMATCH)

## 2013-05-12 LAB — CBC
HCT: 22.2 % — ABNORMAL LOW (ref 36.0–46.0)
Hemoglobin: 6.7 g/dL — CL (ref 12.0–15.0)
MCH: 23.8 pg — AB (ref 26.0–34.0)
MCHC: 30.2 g/dL (ref 30.0–36.0)
MCV: 79 fL (ref 78.0–100.0)
PLATELETS: 225 10*3/uL (ref 150–400)
RBC: 2.81 MIL/uL — AB (ref 3.87–5.11)
RDW: 16.8 % — AB (ref 11.5–15.5)
WBC: 5 10*3/uL (ref 4.0–10.5)

## 2013-05-12 LAB — IRON AND TIBC
Iron: <10 ug/dL — ABNORMAL LOW (ref 42–135)
UIBC: 560 ug/dL — AB (ref 125–400)

## 2013-05-12 LAB — TSH: TSH: 2.81 u[IU]/mL (ref 0.350–4.500)

## 2013-05-12 LAB — VITAMIN B12

## 2013-05-12 LAB — RETICULOCYTES
RBC.: 2.81 MIL/uL — AB (ref 3.87–5.11)
RETIC CT PCT: 2.2 % (ref 0.4–3.1)
Retic Count, Absolute: 61.8 10*3/uL (ref 19.0–186.0)

## 2013-05-12 LAB — H. PYLORI ANTIBODY, IGG

## 2013-05-12 LAB — MRSA PCR SCREENING: MRSA BY PCR: NEGATIVE

## 2013-05-12 LAB — FOLATE RBC: RBC Folate: 1227 ng/mL — ABNORMAL HIGH (ref >280)

## 2013-05-12 LAB — POC OCCULT BLOOD, ED: Fecal Occult Bld: NEGATIVE

## 2013-05-12 LAB — FERRITIN: Ferritin: 7 ng/mL — ABNORMAL LOW (ref 10–291)

## 2013-05-12 SURGERY — EGD (ESOPHAGOGASTRODUODENOSCOPY)
Anesthesia: Moderate Sedation

## 2013-05-12 MED ORDER — OXYCODONE HCL 5 MG PO TABS
5.0000 mg | ORAL_TABLET | Freq: Four times a day (QID) | ORAL | Status: DC | PRN
  Administered 2013-05-12 – 2013-05-13 (×3): 5 mg via ORAL
  Filled 2013-05-12 (×4): qty 1

## 2013-05-12 MED ORDER — OXYCODONE HCL 5 MG PO TABS
5.0000 mg | ORAL_TABLET | Freq: Once | ORAL | Status: AC
  Administered 2013-05-12: 5 mg via ORAL
  Filled 2013-05-12: qty 1

## 2013-05-12 MED ORDER — CYANOCOBALAMIN 250 MCG PO TABS
250.0000 ug | ORAL_TABLET | Freq: Every day | ORAL | Status: DC
  Administered 2013-05-12: 250 ug via ORAL
  Filled 2013-05-12: qty 1

## 2013-05-12 MED ORDER — SERTRALINE HCL 50 MG PO TABS
50.0000 mg | ORAL_TABLET | Freq: Every day | ORAL | Status: DC
  Administered 2013-05-12 – 2013-05-13 (×2): 50 mg via ORAL
  Filled 2013-05-12 (×2): qty 1

## 2013-05-12 MED ORDER — HYDROCHLOROTHIAZIDE 25 MG PO TABS
25.0000 mg | ORAL_TABLET | Freq: Every day | ORAL | Status: DC
  Administered 2013-05-12 – 2013-05-13 (×2): 25 mg via ORAL
  Filled 2013-05-12 (×2): qty 1

## 2013-05-12 MED ORDER — MIDAZOLAM HCL 10 MG/2ML IJ SOLN
INTRAMUSCULAR | Status: DC | PRN
  Administered 2013-05-12: 2 mg via INTRAVENOUS
  Administered 2013-05-12: 3 mg via INTRAVENOUS

## 2013-05-12 MED ORDER — ADULT MULTIVITAMIN W/MINERALS CH
1.0000 | ORAL_TABLET | Freq: Every day | ORAL | Status: DC
  Filled 2013-05-12: qty 1

## 2013-05-12 MED ORDER — OXYCODONE HCL 5 MG PO TABS
10.0000 mg | ORAL_TABLET | Freq: Four times a day (QID) | ORAL | Status: DC | PRN
  Administered 2013-05-12: 10 mg via ORAL
  Filled 2013-05-12: qty 2

## 2013-05-12 MED ORDER — SODIUM CHLORIDE 0.9 % IV SOLN
Freq: Once | INTRAVENOUS | Status: AC
  Administered 2013-05-12: 500 mL via INTRAVENOUS

## 2013-05-12 MED ORDER — CALCIUM CARBONATE-VITAMIN D 500-200 MG-UNIT PO TABS
1.0000 | ORAL_TABLET | Freq: Three times a day (TID) | ORAL | Status: DC
  Filled 2013-05-12 (×2): qty 1

## 2013-05-12 MED ORDER — SUCRALFATE 1 GM/10ML PO SUSP
1.0000 g | Freq: Three times a day (TID) | ORAL | Status: DC
  Administered 2013-05-12 – 2013-05-13 (×4): 1 g via ORAL
  Filled 2013-05-12 (×7): qty 10

## 2013-05-12 MED ORDER — ADULT MULTIVITAMIN W/MINERALS CH
1.0000 | ORAL_TABLET | Freq: Every day | ORAL | Status: DC
  Administered 2013-05-12: 1 via ORAL
  Filled 2013-05-12: qty 1

## 2013-05-12 MED ORDER — CALCIUM CITRATE-VITAMIN D 500-400 MG-UNIT PO CHEW: 1.0000 | CHEWABLE_TABLET | Freq: Three times a day (TID) | ORAL | Status: DC

## 2013-05-12 MED ORDER — ACETAMINOPHEN 325 MG PO TABS: 650.0000 mg | ORAL_TABLET | Freq: Four times a day (QID) | ORAL | Status: DC | PRN

## 2013-05-12 MED ORDER — DIPHENHYDRAMINE HCL 25 MG PO CAPS: 25.0000 mg | ORAL_CAPSULE | ORAL | Status: DC | PRN

## 2013-05-12 MED ORDER — NICOTINE 14 MG/24HR TD PT24
14.0000 mg | MEDICATED_PATCH | Freq: Every day | TRANSDERMAL | Status: DC
  Administered 2013-05-13: 14 mg via TRANSDERMAL
  Filled 2013-05-12 (×2): qty 1

## 2013-05-12 MED ORDER — SODIUM CHLORIDE 0.9 % IJ SOLN
3.0000 mL | Freq: Two times a day (BID) | INTRAMUSCULAR | Status: DC
  Administered 2013-05-12 – 2013-05-13 (×3): 3 mL via INTRAVENOUS

## 2013-05-12 MED ORDER — BUTAMBEN-TETRACAINE-BENZOCAINE 2-2-14 % EX AERO
INHALATION_SPRAY | CUTANEOUS | Status: DC | PRN
  Administered 2013-05-12: 3 via TOPICAL

## 2013-05-12 MED ORDER — MIDAZOLAM HCL 5 MG/ML IJ SOLN
INTRAMUSCULAR | Status: AC
  Filled 2013-05-12: qty 2

## 2013-05-12 MED ORDER — ONDANSETRON HCL 4 MG/2ML IJ SOLN
INTRAMUSCULAR | Status: AC
  Filled 2013-05-12: qty 2

## 2013-05-12 MED ORDER — ONDANSETRON HCL 4 MG/2ML IJ SOLN
4.0000 mg | Freq: Four times a day (QID) | INTRAMUSCULAR | Status: DC | PRN
  Administered 2013-05-12: 4 mg via INTRAVENOUS

## 2013-05-12 MED ORDER — FENTANYL CITRATE 0.05 MG/ML IJ SOLN
INTRAMUSCULAR | Status: DC | PRN
  Administered 2013-05-12 (×2): 25 ug via INTRAVENOUS

## 2013-05-12 MED ORDER — OXYCODONE HCL 5 MG PO TABS
10.0000 mg | ORAL_TABLET | Freq: Once | ORAL | Status: AC
  Administered 2013-05-12: 10 mg via ORAL
  Filled 2013-05-12: qty 2

## 2013-05-12 MED ORDER — MORPHINE SULFATE 2 MG/ML IJ SOLN
2.0000 mg | INTRAMUSCULAR | Status: DC | PRN
  Administered 2013-05-12 – 2013-05-13 (×4): 2 mg via INTRAVENOUS
  Filled 2013-05-12 (×4): qty 1

## 2013-05-12 MED ORDER — MULTIVITAMIN GUMMIES ADULTS PO CHEW: 1.0000 | CHEWABLE_TABLET | Freq: Two times a day (BID) | ORAL | Status: DC

## 2013-05-12 MED ORDER — NON FORMULARY: 1.0000 | Freq: Two times a day (BID) | Status: DC

## 2013-05-12 MED ORDER — PANTOPRAZOLE SODIUM 40 MG PO TBEC
40.0000 mg | DELAYED_RELEASE_TABLET | Freq: Every day | ORAL | Status: DC
  Administered 2013-05-12: 40 mg via ORAL
  Filled 2013-05-12: qty 1

## 2013-05-12 MED ORDER — LORAZEPAM 2 MG/ML IJ SOLN: 0.5000 mg | Freq: Two times a day (BID) | INTRAMUSCULAR | Status: DC | PRN

## 2013-05-12 MED ORDER — ONDANSETRON HCL 4 MG PO TABS: 4.0000 mg | ORAL_TABLET | Freq: Four times a day (QID) | ORAL | Status: DC | PRN

## 2013-05-12 MED ORDER — FENTANYL CITRATE 0.05 MG/ML IJ SOLN
INTRAMUSCULAR | Status: AC
  Filled 2013-05-12: qty 2

## 2013-05-12 MED ORDER — PANTOPRAZOLE SODIUM 40 MG PO TBEC
40.0000 mg | DELAYED_RELEASE_TABLET | Freq: Two times a day (BID) | ORAL | Status: DC
  Administered 2013-05-12 – 2013-05-13 (×2): 40 mg via ORAL
  Filled 2013-05-12: qty 1

## 2013-05-12 MED ORDER — ONDANSETRON HCL 4 MG PO TABS
4.0000 mg | ORAL_TABLET | Freq: Four times a day (QID) | ORAL | Status: DC
  Administered 2013-05-12: 4 mg via ORAL
  Filled 2013-05-12: qty 1

## 2013-05-12 NOTE — Progress Notes (Signed)
Pt arrive to unit in no s/s of distress. Pt oriented to unit and floor. Whiteboard updated. Callbell within reach. VS stable - BP low - pt state hx of low BPs - pt asymptomatic. Skin intact. BLE edema - elevated with a pillow. Report received from ED nurse prior to pt's arrival. Will continue to monitor.

## 2013-05-12 NOTE — H&P (Signed)
[facility] History and Physical  Katherine [name] YCX:[id number] DOB: 18-Oct-1970 DOA: 05/12/2013  Referring physician: EDP PCP: Wendie Agreste, MD   Chief Complaint: Symptomatic anemia, positive D.Dimer   HPI: Katherine [name] is a 43 y.o. 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  She continued to have abdominal pain, was seen by Dr. Lucia Gaskins (surgeon) in office 4/22, and he was considering repeat EGD due to pain.  She also developed leg swelling throughout this period, seen at [facility] on 5/1, venous duplex BLE at that time was negative for DVT.  She went to PCP Dr. Ouida Sills yesterday 5/3, he did blood work on her which included a positive D.Dimer and sent her in to the ED.  Patient has also been complaining of subtle altered mental status over the past month, she describes it as problems doing math.  More interesting to the EDP and myself than the positive D-Dimer in the setting of negative Venous duplex, is the fact that her HGB continues to trend down.  It was 12 on March 31st, 8.7 at hospital discharge, 7.3 on 5/1, and now 6.7 today.  Review of Systems: Systems reviewed.  As above, otherwise negative  Past Medical History  Diagnosis Date  . ADD (attention deficit disorder)   . Anemia   . Blood transfusion without reported diagnosis   . Anxiety disorder     panic  . Anxiety   . MRSA (methicillin resistant Staphylococcus aureus)   . Pneumonia     [place] as child   Past Surgical History  Procedure Laterality Date  . Laparoscopic gastric bypass  [place], [place].  Dr Timmothy Sours  . Cesarean section    . Breast enhancement surgery    . Esophagogastroduodenoscopy N/A 03/31/2013    Procedure: ESOPHAGOGASTRODUODENOSCOPY (EGD);  Surgeon: Shann Medal, MD;  Location: [facility];  Service: General;  Laterality: N/A;  . Laparoscopic revision of gastrojejunostomy N/A 04/08/2013    Procedure: LAPAROSCOPIC REVISION OF GASTROJEJUNOSTOMY AND  UPPER ENDOSCOPY;  Surgeon: Shann Medal, MD;  Location: [facility];  Service: General;  Laterality: N/A;   Social History:  reports that she quit smoking about 6 weeks ago. Her smoking use included Cigarettes. She has a 15 pack-year smoking history. She has never used smokeless tobacco. She reports that she does not drink alcohol or use illicit drugs.  Allergies  Allergen Reactions  . Nsaids     Anemia, and Gastric bypass  . Promethazine Hcl Other (See Comments)    Agitated, restless  . Tramadol Rash    Family History  Problem Relation Age of Onset  . Other Other   . Other Other      Prior to Admission medications   Medication Sig Start Date End Date Taking? Authorizing Provider  amphetamine-dextroamphetamine (ADDERALL) 30 MG tablet Take 1 tablet (30 mg total) by mouth 2 (two) times daily. 12/06/12  Yes [name], MD  Multiple Vitamin (MULTIVITAMIN WITH MINERALS) TABS tablet Take 1 tablet by mouth daily.   Yes Historical Provider, MD  nicotine (NICODERM CQ - DOSED IN MG/24 HOURS) 14 mg/24hr patch Place 14 mg onto the skin daily.   Yes Historical Provider, MD  ondansetron (ZOFRAN) 4 MG tablet Take 1 tablet (4 mg total) by mouth every 6 (six) hours. 03/27/13  Yes Shari A Upstill, PA-C  pantoprazole (PROTONIX) 40 MG tablet Take 40 mg by mouth daily.  04/12/13  Yes Historical Provider, MD  sertraline (ZOLOFT)  50 MG tablet Take 1 tablet (50 mg total) by mouth daily. 12/06/12  Yes [name], MD  vitamin B-12 (CYANOCOBALAMIN) 250 MCG tablet Take 250 mcg by mouth daily.   Yes Historical Provider, MD  hydrochlorothiazide (HYDRODIURIL) 25 MG tablet Take 1 tablet (25 mg total) by mouth daily. 05/11/13   [name], MD   Physical Exam: Filed Vitals:   05/12/13 0145  BP: 98/61  Pulse: 71  Temp:   Resp:     BP 98/61  Pulse 71  Temp(Src) 97.5 F (36.4 C)  Resp 18  Ht 5\' 4"  (1.626 m)  Wt 52.617 kg (116 lb)  BMI 19.90 kg/m2  SpO2 100%  LMP 05/11/2013  General Appearance:     Alert, oriented, no distress, appears stated age  Head:    Normocephalic, atraumatic  Eyes:    PERRL, EOMI, sclera non-icteric        Nose:   Nares without drainage or epistaxis. Mucosa, turbinates normal  Throat:   Moist mucous membranes. Oropharynx without erythema or exudate.  Neck:   Supple. No carotid bruits.  No thyromegaly.  No lymphadenopathy.   Back:     No CVA tenderness, no spinal tenderness  Lungs:     Clear to auscultation bilaterally, without wheezes, rhonchi or rales  Chest wall:    No tenderness to palpitation  Heart:    Regular rate and rhythm without murmurs, gallops, rubs  Abdomen:     Soft, non-tender, nondistended, normal bowel sounds, no organomegaly  Genitalia:    deferred  Rectal:    deferred  Extremities:   No clubbing, cyanosis or edema.  Pulses:   2+ and symmetric all extremities  Skin:   Skin color, texture, turgor normal, no rashes or lesions  Lymph nodes:   Cervical, supraclavicular, and axillary nodes normal  Neurologic:   CNII-XII intact. Normal strength, sensation and reflexes      throughout    Labs on Admission:  Basic Metabolic Panel:  Recent Labs Lab 05/08/13 2040 05/11/13 1847  NA 136* 136  K 3.8 4.1  CL 99 102  CO2 26 26  GLUCOSE 60* 79  BUN 8 8  CREATININE 0.50 0.52  CALCIUM 9.0 8.7   Liver Function Tests:  Recent Labs Lab 05/08/13 2040 05/11/13 1847  AST 38* 29  ALT 46* 31  ALKPHOS 108 88  BILITOT 0.3 0.3  PROT 7.1 6.2  ALBUMIN 3.5 3.7    Recent Labs Lab 05/08/13 2040  LIPASE 40   No results found for this basename: AMMONIA,  in the last 168 hours CBC:  Recent Labs Lab 05/08/13 2040 05/11/13 1905 05/12/13 0102  WBC 6.1 4.2* 5.0  NEUTROABS 4.3  --   --   HGB 7.3* 6.7* 6.7*  HCT 23.8* 22.9* 22.2*  MCV 78.0 78.2* 79.0  PLT 272  --  225   Cardiac Enzymes: No results found for this basename: CKTOTAL, CKMB, CKMBINDEX, TROPONINI,  in the last 168 hours  BNP (last 3 results) No results found for this  basename: PROBNP,  in the last 8760 hours CBG:  Recent Labs Lab 05/09/13 0021  GLUCAP 290*    Radiological Exams on Admission: No results found.  EKG: Independently reviewed  Assessment/Plan Principal Problem:   Symptomatic anemia Active Problems:   Gastrojejunal ulcer s/p Lap gastric bypass   1. Symptomatic anemia- hemoccult negative, but given the down trend, ongoing pain post op, and history of GJ ulcer prior to resection, EGD likely wouldn't be a bad  idea at this point if it can be done at this time after surgery (Dr. Lucia Gaskins was thinking about this anyhow in his 5/22 note).  Iron studies pending, also ordering B12 and Folate given her h/o bypass, but anemia does not appear macrocytic.  Given the risk for GIB will hold heparin and just use SCDs.  PRBC ordered for transfusion.  Day team likely will want to try and contact Dr. Lucia Gaskins tomorrow AM (per Shea Evans, he does appear to be on 'DOW' Call) 2. LE swelling and elevated D-Dimer - to provide reassurance to the anxious patient and family will re-order her venous duplex BLE, however this is extremely low yield in the setting of negative duplex 2 days ago.  Code Status: Full Code  Family Communication: Family at bedside Disposition Plan: Admit to inpatient   Time spent: 71 min  [facility] Pager [phone]  If 7AM-7PM, please contact the day team taking care of the patient [email or web address] Password [email or web address] 05/12/2013, 2:32 AM

## 2013-05-12 NOTE — Progress Notes (Addendum)
INITIAL NUTRITION ASSESSMENT  DOCUMENTATION CODES Per approved criteria  -Severe malnutrition in the context of chronic illness -Underweight   INTERVENTION: Recommend chewable MVI BID (Centrum chewable available from [facility].) Please order as non-formulary med. Do not provide both MVI at the same time. Recommend calcium citrate and vitamin D 500 mg chewable po TID (available from [facility]). Please do not substitute. Provide 2 hours s/p MVI. Recommend Ensure Complete po BID, each supplement provides 350 kcal and 13 grams of protein, between meals as diet order permits. Recommend advancement of diet to Full Liquids per team's discretion. If tolerates well, may advance to Soft Diet. Discussed all of the above with PA Student, HC Martensen. RD to continue to follow nutrition care plan.  NUTRITION DIAGNOSIS: Inadequate oral intake related to inability to eat as evidenced by NPO status.   Goal: Further diet advancement. Intake to meet >90% of estimated nutrition needs.  Monitor:  weight trends, lab trends, I/O's, PO intake, supplement tolerance  Reason for Assessment: Malnutrition Screening Tool  43 y.o. female  Admitting Dx: Symptomatic anemia  ASSESSMENT: PMHx significant for s/p revision of gastric bypass 2/2 stenotic GJ junction with ulcer. Hx of gastric bypass in 2007. She said that her initial weight was 232 pounds. Her weight was stable in the 130's until her divorce a couple of years ago, then her weight went to the 110's pounds. Endoscopy today revealed stenosis of gastrojejunal anastomosis no more than 60mm with marginal ulcer and erosion of silastic ring into adjacent area. Admitted with anemia.   Per chart review, pt received Full Liquids and TPN while at [facility] at end of March/beginning of April. Pt reports that since she has left [facility], she has been progressing her nutrition. Reports compliance with her medications and has bought some protein powder and supplements to help meet her nutritional  needs.  Patient with severe muscle wasting and subcutaneous fat loss in orbital and temporal region, clavicles, upper arms, and hands. Reports that the last time that she ate was 7pm last night, consumed high protein lemon pudding and broth soup. Pt reports that she is hungry and would really like to eat.  Pt meets criteria for severe MALNUTRITION in the context of chronic illness as evidenced by severe fat and muscle mass loss.  Current weight of 113 lb is likely artificially elevated 2/2 fluid status. Sodium and potassium WNL.   Height: Ht Readings from Last 1 Encounters:  05/12/13 5' 4.8" (1.646 m)    Weight: Wt Readings from Last 1 Encounters:  05/12/13 113 lb (51.256 kg)    Ideal Body Weight: 125 lb  % Ideal Body Weight: 90%  Wt Readings from Last 10 Encounters:  05/12/13 113 lb (51.256 kg)  05/11/13 115 lb 3.2 oz (52.254 kg)  04/30/13 106 lb 6.4 oz (48.263 kg)  04/14/13 97 lb (43.999 kg)  04/14/13 97 lb (43.999 kg)  04/14/13 97 lb (43.999 kg)  03/30/13 92 lb (41.731 kg)  03/27/13 94 lb 1 oz (42.666 kg)  12/06/12 106 lb (48.081 kg)  12/01/12 116 lb (52.617 kg)    Usual Body Weight: 100 lb  % Usual Body Weight: 113%  BMI:  Body mass index is 18.92 kg/(m^2). WNL  Estimated Nutritional Needs: Kcal: 1400 - 1600 kcal Protein: 65 - 80 grams Fluid: 1.4 - 1.6 liters daily  Skin: intact  Diet Order: NPO  EDUCATION NEEDS: -No education needs identified at this time   Intake/Output Summary (Last 24 hours) at 05/12/13 1219 Last data filed at  05/12/13 0615  Gross per 24 hour  Intake  262.5 ml  Output      0 ml  Net  262.5 ml    Last BM: 5/3  Labs:   Recent Labs Lab 05/08/13 2040 05/11/13 1847  NA 136* 136  K 3.8 4.1  CL 99 102  CO2 26 26  BUN 8 8  CREATININE 0.50 0.52  CALCIUM 9.0 8.7  GLUCOSE 60* 79    CBG (last 3)  No results found for this basename: GLUCAP,  in the last 72 hours  Scheduled Meds: . hydrochlorothiazide  25 mg Oral Daily   . multivitamin with minerals  1 tablet Oral Daily  . nicotine  14 mg Transdermal Daily  . pantoprazole  40 mg Oral Daily  . sertraline  50 mg Oral Daily  . sodium chloride  3 mL Intravenous Q12H  . vitamin B-12  250 mcg Oral Daily    Continuous Infusions:   Past Medical History  Diagnosis Date  . ADD (attention deficit disorder)   . Anemia   . Blood transfusion without reported diagnosis   . Anxiety disorder     panic  . Anxiety   . MRSA (methicillin resistant Staphylococcus aureus)   . Pneumonia     [place] as child    Past Surgical History  Procedure Laterality Date  . Laparoscopic gastric bypass  [place], [place].  Dr Timmothy Sours  . Cesarean section    . Breast enhancement surgery    . Esophagogastroduodenoscopy N/A 03/31/2013    Procedure: ESOPHAGOGASTRODUODENOSCOPY (EGD);  Surgeon: Shann Medal, MD;  Location: [facility];  Service: General;  Laterality: N/A;  . Laparoscopic revision of gastrojejunostomy N/A 04/08/2013    Procedure: LAPAROSCOPIC REVISION OF GASTROJEJUNOSTOMY AND UPPER ENDOSCOPY;  Surgeon: Shann Medal, MD;  Location: [facility];  Service: General;  Laterality: N/A;    Inda Coke MS, RD, LDN Inpatient Registered Dietitian Pager: [phone] After-hours pager: [phone]

## 2013-05-12 NOTE — ED Provider Notes (Addendum)
CSN: [id number]     Arrival date & time 05/11/13  2306 History   None    Chief Complaint  Patient presents with  . poss blood clot      (Consider location/radiation/quality/duration/timing/severity/associated sxs/prior Treatment) HPI  Patient is a 43 yo woman with a history of anemia secondary to bleeding peptic ulcer, anxiety disorder and recent laparascopic revision of gastrojejunostomy by Dr. Lucia Gaskins.  She says she was told to come to the ED by Dr. Ouida Sills, her PCP, whom she saw this evening at Urgent Care clinic. The patient had been seen at the [facility] ED 2 days ago for complaints of bilateral LE edema and had a vascular venous u/s of both legs - the study was negative for DVT. She was asked to follow up with Dr. Ouida Sills and in follow up appt he checked a d-dimer level. The patient says she left the office at 1900 and received a phone message from Dr. Ouida Sills at approx 2100 informing her that her d-dimer level was elevated and that she needed to go to the ED to be evaluated for a DVT.   The patient has no h/o DVT. She complains of tightness in her leg and chronic back pain. No sob or chest pain. However, the patient says she has felt foggy headed over the past couple of days. She sometimes has short term memory troubles. She has not had any black or tarry stools. She is currently menstruating. No abd pain, N/V/D.   Past Medical History  Diagnosis Date  . ADD (attention deficit disorder)   . Anemia   . Blood transfusion without reported diagnosis   . Anxiety disorder     panic  . Anxiety   . MRSA (methicillin resistant Staphylococcus aureus)   . Pneumonia     [place] as child   Past Surgical History  Procedure Laterality Date  . Laparoscopic gastric bypass  [place], [place].  Dr Timmothy Sours  . Cesarean section    . Breast enhancement surgery    . Esophagogastroduodenoscopy N/A 03/31/2013    Procedure: ESOPHAGOGASTRODUODENOSCOPY (EGD);  Surgeon: Shann Medal, MD;  Location: [facility];  Service: General;  Laterality: N/A;  . Laparoscopic revision of gastrojejunostomy N/A 04/08/2013    Procedure: LAPAROSCOPIC REVISION OF GASTROJEJUNOSTOMY AND UPPER ENDOSCOPY;  Surgeon: Shann Medal, MD;  Location: [facility];  Service: General;  Laterality: N/A;   Family History  Problem Relation Age of Onset  . Other Other   . Other Other    History  Substance Use Topics  . Smoking status: Former Smoker -- 1.00 packs/day for 15 years    Types: Cigarettes    Quit date: 03/30/2013  . Smokeless tobacco: Never Used     Comment: patient started patches in the hospital 03/30/13  . Alcohol Use: No   OB History   Grav Para Term Preterm Abortions TAB SAB Ect Mult Living                 Review of Systems Ten point review of symptoms performed and is negative with the exception of symptoms noted above.     Allergies  Nsaids; Promethazine hcl; and Tramadol  Home Medications   Prior to Admission medications   Medication Sig Start Date End Date Taking? Authorizing Provider  amphetamine-dextroamphetamine (ADDERALL) 30 MG tablet Take 1 tablet (30 mg total) by mouth 2 (two) times daily. 12/06/12   [name], MD  [facility] NICOTINE 7 MG/24HR patch Place 7 mg onto  the skin daily.  04/12/13   Historical Provider, MD  hydrochlorothiazide (HYDRODIURIL) 25 MG tablet Take 1 tablet (25 mg total) by mouth daily. 05/11/13   [name], MD  HYDROcodone-acetaminophen (NORCO/VICODIN) 5-325 MG per tablet Take 1 tablet by mouth every 6 (six) hours as needed for moderate pain.  04/30/13   Historical Provider, MD  Multiple Vitamin (MULTIVITAMIN WITH MINERALS) TABS tablet Take 1 tablet by mouth daily.    Historical Provider, MD  ondansetron (ZOFRAN) 4 MG tablet Take 1 tablet (4 mg total) by mouth every 6 (six) hours. 03/27/13   Shari A Upstill, PA-C  oxyCODONE (OXY IR/ROXICODONE) 5 MG immediate release tablet Take 5 mg by mouth every 6 (six) hours as needed.  05/01/13   Historical Provider, MD   oxyCODONE-acetaminophen (PERCOCET/ROXICET) 5-325 MG per tablet Take 1 tablet by mouth every 6 (six) hours as needed for severe pain (# 20 per Dr. Brantley Stage). 05/08/13   Thomas A. Cornett, MD  pantoprazole (PROTONIX) 40 MG tablet  04/12/13   Historical Provider, MD  sertraline (ZOLOFT) 50 MG tablet Take 1 tablet (50 mg total) by mouth daily. 12/06/12   [name], MD  vitamin B-12 (CYANOCOBALAMIN) 250 MCG tablet Take 250 mcg by mouth daily.    Historical Provider, MD   BP 115/67  Pulse 94  Temp(Src) 97.5 F (36.4 C)  Resp 18  Ht 5\' 4"  (1.626 m)  Wt 116 lb (52.617 kg)  BMI 19.90 kg/m2  SpO2 100%  LMP 05/11/2013 Physical Exam Gen: well developed and poorly nourished appearing Head: NCAT Eyes: PERL, EOMI Nose: no epistaixis or rhinorrhea Mouth/throat: mucosa is moist and pink Neck: supple, no stridor Lungs: CTA B, no wheezing, rhonchi or rales CV: RRR, no murmur, extremities appear well perfused.  Abd: soft, notender, nondistended Back: no ttp, no cva ttp Skin: warm and dry Ext: normal to inspection, no dependent edema Neuro: CN ii-xii grossly intact, no focal deficits Psyche; normal affect,  calm and cooperative.   ED Course  Procedures (including critical care time)   MDM  Patient is s/p venous duplex study for U/S < 48 hrs PTA. It is unclear why she was referred back to the ED because of an elevated d-dimer which was performed at a follow up PCP visit. I have explained to the patient that a repeat U/S is not indicated as the first was an adequate study and reliable to rule out DVT. I have suggested to the patient that she follow up with her PCP.   ED work up notable for symptomatic anemia discovered with Hgb 6.5 and trending down over last week of previous labs. Case discussed with Dr. Fabio Neighbors who will admit. Tranfusion order initiated.   Elyn Peers, MD 05/30/13 2011  Elyn Peers, MD 05/30/13 2015

## 2013-05-12 NOTE — ED Notes (Signed)
Consent for blood transfusion signed at bedside with Dr. Cheri Guppy.

## 2013-05-12 NOTE — Progress Notes (Signed)
Bilateral lower extremity venous duplex:  No evidence of DVT, superficial thrombosis, or Baker's Cyst.   

## 2013-05-12 NOTE — Progress Notes (Signed)
Called by [facility] service to take over pt as she is seen by [place].  Will do this one time but in future not appropriate to transfer [place] pt after admission due to continuity of care.  She is currently in endoscopy getting UGI to evaluate for marginal ulcer 2/2 previous REYGB.  Appears to have chronic microcytic anemia with indices/ferritin/UIBC/iron strongly suggesting this.  Does have Hx of menorrhagia w/ endometriosis but has not had menses in about 4-5 weeks.  Appreciate surgery consult and management for her possible GI bleed.    Will assume care in AM and start formally rounding on the pt in the AM.  Discussed with [facility].  Tamela Oddi Awanda Mink, DO of [facility] 05/12/2013, 4:04 PM

## 2013-05-12 NOTE — Consult Note (Signed)
Reason for Consult: symptomatic anemia Referring Physician: Imogene Burn, PA   HPI: Katherine [name] is a 43 year old female well known to Dr. Lucia Gaskins who was admitted early this AM with anemia and BLE swelling.  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  She reports feeling fairly well until last Thursday at which time she developed BLE edema and nausea.  She was seen at [facility] found to have a hgb of 7.9, d dimer was negative and a low CBG.  She was given fluids and subsequently discharged home.  She followed up with her PCP (Dr. Ouida Sills) on Sunday at which time she reports her hb was 6.3 and had a positive d dimer, she was therefore referred to the ED.  Upon ED evaluation, hgb was 6.7, FOBT was negative.  She is receiving 1 unit of PRBCs now.  Labs show iron <10, vitamin b12 is high.  She reports prior to Thursday, she has been taking her supplements, following her diet and continues to abstain from tobacco use.  She complains of intermittent nausea without vomiting.  She reports right back and epigastric abdominal pain.  Denies melena or hematochezia.  Weight has been stable.  She is not anticoagulated.  She denies chest pains, shortness of breath.  She reports increased problems with her short term memory.  She denies overall weakness.    Past Medical History  Diagnosis Date  . ADD (attention deficit disorder)   . Anemia   . Blood transfusion without reported diagnosis   . Anxiety disorder     panic  . Anxiety   . MRSA (methicillin resistant Staphylococcus aureus)   . Pneumonia     [place] as child    Past Surgical History  Procedure Laterality Date  . Laparoscopic gastric bypass  [place], [place].  Dr Timmothy Sours  . Cesarean section    . Breast enhancement surgery    . Esophagogastroduodenoscopy N/A  03/31/2013    Procedure: ESOPHAGOGASTRODUODENOSCOPY (EGD);  Surgeon: Shann Medal, MD;  Location: [facility];  Service: General;  Laterality: N/A;  . Laparoscopic revision of gastrojejunostomy N/A 04/08/2013    Procedure: LAPAROSCOPIC REVISION OF GASTROJEJUNOSTOMY AND UPPER ENDOSCOPY;  Surgeon: Shann Medal, MD;  Location: [facility];  Service: General;  Laterality: N/A;    Family History  Problem Relation Age of Onset  . Other Other   . Other Other     Social History:  reports that she quit smoking about 6 weeks ago. Her smoking use included Cigarettes. She has a 15 pack-year smoking history. She has never used smokeless tobacco. She reports that she does not drink alcohol or use illicit drugs.  Allergies:  Allergies  Allergen Reactions  . Nsaids     Anemia, and Gastric bypass  . Promethazine Hcl Other (See Comments)    Agitated, restless  . Tramadol Rash    Medications:  Scheduled Meds: . calcium-vitamin D  1 tablet Oral TID WC  . hydrochlorothiazide  25 mg Oral Daily  . multivitamin with minerals  1 tablet Oral Daily  . nicotine  14 mg Transdermal Daily  . ondansetron      . pantoprazole  40 mg Oral Daily  . sertraline  50 mg Oral Daily  . sodium chloride  3 mL Intravenous Q12H   Continuous Infusions:  PRN Meds:.acetaminophen, diphenhydrAMINE, LORazepam, morphine  injection, ondansetron (ZOFRAN) IV, ondansetron, oxyCODONE   Results for orders placed during the hospital encounter of 05/12/13 (from the past 48 hour(s))  POC OCCULT BLOOD, ED     Status: None   Collection Time    05/12/13 12:56 AM      Result Value Ref Range   Fecal Occult Bld NEGATIVE  NEGATIVE  CBC     Status: Abnormal   Collection Time    05/12/13  1:02 AM      Result Value Ref Range   WBC 5.0  4.0 - 10.5 K/uL   RBC 2.81 (*) 3.87 - 5.11 MIL/uL   Hemoglobin 6.7 (*) 12.0 - 15.0 g/dL   Comment: REPEATED TO VERIFY     CRITICAL RESULT CALLED TO, READ BACK BY AND VERIFIED WITH:     [facility] RN [id number]  0140 GREEN R   HCT 22.2 (*) 36.0 - 46.0 %   MCV 79.0  78.0 - 100.0 fL   MCH 23.8 (*) 26.0 - 34.0 pg   MCHC 30.2  30.0 - 36.0 g/dL   RDW 16.8 (*) 11.5 - 15.5 %   Platelets 225  150 - 400 K/uL  TYPE AND SCREEN     Status: None   Collection Time    05/12/13  1:02 AM      Result Value Ref Range   ABO/RH(D) O POS     Antibody Screen NEG     Sample Expiration 05/15/2013     Unit Number [id number]     Blood Component Type RED CELLS,LR     Unit division 00     Status of Unit ISSUED     Transfusion Status OK TO TRANSFUSE     Crossmatch Result Compatible     Unit Number [id number]     Blood Component Type RED CELLS,LR     Unit division 00     Status of Unit ISSUED     Transfusion Status OK TO TRANSFUSE     Crossmatch Result Compatible    TSH     Status: None   Collection Time    05/12/13  1:02 AM      Result Value Ref Range   TSH 2.810  0.350 - 4.500 uIU/mL   Comment: Please note change in reference range.  RETICULOCYTES     Status: Abnormal   Collection Time    05/12/13  1:02 AM      Result Value Ref Range   Retic Ct Pct 2.2  0.4 - 3.1 %   RBC. 2.81 (*) 3.87 - 5.11 MIL/uL   Retic Count, Manual 61.8  19.0 - 186.0 K/uL  PREPARE RBC (CROSSMATCH)     Status: None   Collection Time    05/12/13  2:00 AM      Result Value Ref Range   Order Confirmation ORDER PROCESSED BY BLOOD BANK    FERRITIN     Status: Abnormal   Collection Time    05/12/13  3:03 AM      Result Value Ref Range   Ferritin 7 (*) 10 - 291 ng/mL   Comment: Performed at [place] TIBC     Status: Abnormal   Collection Time    05/12/13  3:03 AM      Result Value Ref Range   Iron <10 (*) 42 - 135 ug/dL   TIBC Not calculated due to Iron <10.  250 - 470 ug/dL   Saturation Ratios Not calculated due to Iron <  10.  20 - 55 %   UIBC 560 (*) 125 - 400 ug/dL   Comment: Performed at [place]     Status: Abnormal   Collection Time    05/12/13  3:03 AM      Result  Value Ref Range   Vitamin B-12 >2000 (*) 211 - 911 pg/mL   Comment: Performed at [place] PCR SCREENING     Status: None   Collection Time    05/12/13  3:42 AM      Result Value Ref Range   MRSA by PCR NEGATIVE  NEGATIVE   Comment:            The GeneXpert MRSA Assay (FDA     approved for NASAL specimens     only), is one component of a     comprehensive MRSA colonization     surveillance program. It is not     intended to diagnose MRSA     infection nor to guide or     monitor treatment for     MRSA infections.  PREPARE RBC (CROSSMATCH)     Status: None   Collection Time    05/12/13 11:15 AM      Result Value Ref Range   Order Confirmation ORDER PROCESSED BY BLOOD BANK      No results found.  Review of Systems  Constitutional: Negative for fever, chills and weight loss.       Frail appearing   Eyes: Negative for blurred vision, double vision and photophobia.  Respiratory: Negative for cough, hemoptysis, sputum production, shortness of breath and wheezing.   Cardiovascular: Positive for palpitations and leg swelling. Negative for chest pain, orthopnea, claudication and PND.  Gastrointestinal: Positive for nausea and abdominal pain. Negative for heartburn, vomiting, diarrhea, constipation, blood in stool and melena.  Genitourinary: Negative for dysuria and urgency.  Musculoskeletal: Positive for back pain. Negative for myalgias.  Neurological: Positive for sensory change and headaches. Negative for dizziness, tingling, tremors, speech change, focal weakness, seizures, loss of consciousness and weakness.  Psychiatric/Behavioral: Negative for substance abuse.   Blood pressure 130/69, pulse 85, temperature 98.1 F (36.7 C), temperature source Oral, resp. rate 18, height 5' 4.8" (1.646 m), weight 113 lb (51.256 kg), last menstrual period 05/11/2013, SpO2 100.00%. Physical Exam  Constitutional: She is oriented to person, place, and time. No distress.   Malnourished appearing   HENT:  Head: Normocephalic and atraumatic.  Mouth/Throat: No oropharyngeal exudate.  Cardiovascular: Normal rate, regular rhythm, normal heart sounds and intact distal pulses.  Exam reveals no gallop and no friction rub.   No murmur heard. Respiratory: Effort normal and breath sounds normal. No respiratory distress. She has no wheezes. She has no rales. She exhibits no tenderness.  GI: Soft. Bowel sounds are normal. She exhibits no distension and no mass. There is no rebound and no guarding.  Minimal ttp mid to right epigastric region  Musculoskeletal: She exhibits no tenderness.  +1 ankle/pedal edema  Neurological: She is alert and oriented to person, place, and time.  Skin: Skin is warm and dry. No rash noted. She is not diaphoretic. No erythema. No pallor.  Psychiatric: She has a normal mood and affect. Her behavior is normal. Judgment and thought content normal.    Assessment/Plan: Anemia  agree with transfusion.  May benefit from iron infusion in the near future.  Keep her NPO for now.  Will discuss with Dr. Lucia Gaskins regarding an upper endoscopy  to evaluate for an ulcer.  The lower extremity edema is likely due to the anemia.  Dopplers negative for DVT, renal, hepatic function are normal and no known heart failure.  Recommend symptomatic management with compression stockings.   Tobacco dependence Stopped in March, nicotine patch PCM Resume diet/supplement post procedure   Emina Riebock ANP-BC 05/12/2013, 2:27 PM    Agree with above. Worried about marginal ulcer. Plan endo today. Discussed thoroughly with patient and mother.  Alphonsa Overall, MD, [facility] Surgery Pager: [phone] Office phone:  [phone]

## 2013-05-12 NOTE — ED Notes (Signed)
Reticulocyte added on to blood work sent earlier [employer]

## 2013-05-12 NOTE — Progress Notes (Addendum)
[facility] PROGRESS NOTE  [name] NIO:[id number] DOB: 1970/04/11 DOA: 05/12/2013 PCP: Wendie Agreste, MD ([facility])  HPI: Katherine [name] is a 43 y.o. 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 Continued abdominal pain, was seen by Dr. Lucia Gaskins (surgeon) in office 4/22, who was considering repeat EGD due to pain. She also developed leg swelling throughout this period, seen at [facility] on 5/1, venous duplex BLE at that time was negative for DVT. She went to PCP Dr. Ouida Sills 5/3,  blood work included a positive D.Dimer - sent to ED.  Subjective: Patient reports pain in her right lower abdomen and flank.  She also has pain on the medial aspect of her right ankle.  She has been fatigued lately and says that the swelling in her legs started Thursday.  She rates her pain as a 10/10, but appears to be sitting comfortably.  Assessment/Plan: Symptomatic Anemia - Hgb 6.7, Hct 22.2 (0102 AM 05/12/13) - Hemoccult negative, ongoing pain post op.  Hx of GJ ulcer prior to resection 04/08/2013 - [facility], Dr. Lucia Gaskins, consulted. - Hx of Iron deficiency anemia secondary to menorrhagia - Ferritin 7, Iron <10, TIBC not calculated due to Iron <10.  UIBC 560 - B12 high (>2000pg/mL).  D/C B12 Supplement - H. Pylori antibody IgG and Folate In Process - GIB risk.  Hold heparin.  Use SCDs.  - 1 unit PRBC transfused. 1 additional unit ordered  LE swelling and elevated D-Dimer  - Venous duplex BLE re-ordered by ED to provide reassurance to the anxious patient and family.  However negative duplex 05/09/13  Abdominal Pain - Uncertain etiology.  Possibly secondary to Laparoscopic revision of Gastrojejunostomy and Upper endoscopy  04/08/2013 - D. [facility] - PRN Oxy ir and Low dose morphine.  - Daily PPI.  Will consider sucralfate.  Pending Dr. Pollie Friar evaluation.   History of RYGB - Feb 2007  - Dr. Timmothy Sours in [place] (was with Dewitt Hoes), but  she said that he has moved. She has not seen him in at least 5 years.   Malnourished.  - Nutrition recommends advancing diet from NPO ASAP (EGD possible).  Full liquids + Ensure then advanced to Soft.  ADD - since age 87  - At home dose adderall 30mg  BID.  Currently d/c'd  Hx of Anxiety disorder  - No current home  - Will give low dose ativan prn inpatient.  Tobacco Dependance  - On nicotine patch.   Disposition: Stable  DVT Prophylaxis: SCDs  Code Status: Full Family Communication: None Disposition Plan:  Home   Consultants: [facility] - Dr. Lucia Gaskins.  Procedures:  None  Antibiotics: None  Objective: Filed Vitals:   05/12/13 0927  BP: 105/61  Pulse: 75  Temp: 98 F (36.7 C)  Resp: 16    Intake/Output Summary (Last 24 hours) at 05/12/13 1157 Last data filed at 05/12/13 0615  Gross per 24 hour  Intake  262.5 ml  Output      0 ml  Net  262.5 ml   Filed Weights   05/11/13 2332 05/12/13 0337  Weight: 52.617 kg (116 lb) 51.256 kg (113 lb)     Physical Exam General: Awake, alert, and oriented x3.  NAD HEENT: Normocephalic/Atraumatic, conj. clear, anicteric sclera Neck: Supple, full ROM, no JVD Lungs: Breathing unlabored, CTA b/l anterior/posterior CV: RRR, no m/g/r appreciated Abdomen: Non distended, +BS, RLQ mildly tender to palpation.  NO Rebound. Extremities: B/L LE  edema.  Right medial ankle tender to palpation.  Pain with Left foot extension.  Distal pulses intact.  Warm to touch Skin: No rash or lesions Neuro: No focal deficits Psych: Responses appropriate, normal affect  Data Reviewed: Basic Metabolic Panel:  Recent Labs Lab 05/08/13 2040 05/11/13 1847  NA 136* 136  K 3.8 4.1  CL 99 102  CO2 26 26  GLUCOSE 60* 79  BUN 8 8  CREATININE 0.50 0.52  CALCIUM 9.0 8.7   Liver Function Tests:  Recent Labs Lab 05/08/13 2040 05/11/13 1847  AST 38* 29  ALT 46* 31  ALKPHOS 108 88  BILITOT 0.3 0.3  PROT 7.1 6.2  ALBUMIN 3.5 3.7    Recent  Labs Lab 05/08/13 2040  LIPASE 40   CBC:  Recent Labs Lab 05/08/13 2040 05/11/13 1905 05/12/13 0102  WBC 6.1 4.2* 5.0  NEUTROABS 4.3  --   --   HGB 7.3* 6.7* 6.7*  HCT 23.8* 22.9* 22.2*  MCV 78.0 78.2* 79.0  PLT 272  --  225   CBG:  Recent Labs Lab 05/09/13 0021  GLUCAP 290*    Recent Results (from the past 240 hour(s))  MRSA PCR SCREENING     Status: None   Collection Time    05/12/13  3:42 AM      Result Value Ref Range Status   MRSA by PCR NEGATIVE  NEGATIVE Final   Comment:            The GeneXpert MRSA Assay (FDA     approved for NASAL specimens     only), is one component of a     comprehensive MRSA colonization     surveillance program. It is not     intended to diagnose MRSA     infection nor to guide or     monitor treatment for     MRSA infections.     Studies: No results found.  Scheduled Meds: . hydrochlorothiazide  25 mg Oral Daily  . multivitamin with minerals  1 tablet Oral Daily  . nicotine  14 mg Transdermal Daily  . pantoprazole  40 mg Oral Daily  . sertraline  50 mg Oral Daily  . sodium chloride  3 mL Intravenous Q12H  . vitamin B-12  250 mcg Oral Daily    Principal Problem:   Symptomatic anemia Active Problems:   Gastrojejunal ulcer s/p Lap gastric bypass   Time spent: [place], [place] [phone] [facility]  If 7PM-7AM, please contact night-coverage at [email or web address], password [facility] 05/12/2013, 11:57 AM  LOS: 0 days

## 2013-05-12 NOTE — Op Note (Signed)
05/12/2013  6:01 PM  PATIENT:  Katherine [name], 43 y.o., female, MRN: [id number]  PREOP DIAGNOSIS:  Probable GI blood loss   POSTOP DIAGNOSIS:   Marginal ulcer at gastrojejunal anastomosis  PROCEDURE:  Esophagogastrojejunoscopy  SURGEON:   Alphonsa Overall, M.D.  ANESTHESIA:   Fentanyl  50 mcg   Versed 5 mg  INDICATIONS FOR PROCEDURE:  [name] is a 43 y.o. (DOB: 03-11-1970)  white  female whose primary care physician is GREENE,JEFFREY R, MD and comes for upper endoscopy to evaluate probable GI blood loss.  The patient had a RYGB on Feb 2007 by Dr. Timmothy Sours in [place], [place].  She had a complete outlet obstruction and underwent a revision of gastrojejunostomy by Dr. Lucia Gaskins on 04/08/2013.  She was doing well, though had persistant RUQ pain and was anemic to 6.7 today.  She was admitted for further evaluation.   The indications and risks of the endoscopy were explained to the patient.  The risks include, but are not limited to, perforation, bleeding, or injury to the bowel.  If balloon dilatation is needed, the risk of perforation is higher.  PROCEDURE:  The patient was in room 1 at the [facility] endoscopy unit.  The patient was monitored with a pulse oximetry, BP cuff, and EKG.  The patient has nasal O2 flowing during the procedure.   The back of the throat was anesthestized with Ceticaine x 3.  The patient was positioned in the left lateral decubitus position.  The patient was given Fentanyl and Versed.  A flexible Pentax endoscope was passed down the throat without difficulty.   Findings include:   Esophagus:   Normal   GE junction at:  35 cm   Stomach pouch: Irregular, but normal mucosa   Gastrojejunal anastomosis:   41 cm.  There is an ulcer with fibrinous material on the jejunal side of the anastomosis.  It did not look like it recently bled.  And she had friable stomach mucosa, without ulcer, at the gastrojejunostomy also.   Efferent jejunal limb:  30 cm down stream was  norm. Afferent jejunal limb:  Normal.   PLAN:   Photos taken and placed in the chart.  She will need double dose Protonix (40 mg BID) and carafate on discharge.  I will see her in the office in 2 to 3 weeks with planned repeat CBC.  Alphonsa Overall, MD, [facility] Surgery Pager: [phone] Office phone:  [phone]

## 2013-05-12 NOTE — ED Notes (Signed)
EDP at bedside  

## 2013-05-12 NOTE — ED Notes (Signed)
Notified by lab of hemoglobin of 6.7.  Dr. Cheri Guppy notified.

## 2013-05-12 NOTE — ED Notes (Signed)
Transporting patient to new room assignment. 

## 2013-05-12 NOTE — ED Notes (Signed)
Admitting at bedside 

## 2013-05-13 ENCOUNTER — Encounter (HOSPITAL_COMMUNITY): Payer: Self-pay | Admitting: Surgery

## 2013-05-13 DIAGNOSIS — R64 Cachexia: Secondary | ICD-10-CM

## 2013-05-13 DIAGNOSIS — F411 Generalized anxiety disorder: Secondary | ICD-10-CM

## 2013-05-13 LAB — TYPE AND SCREEN
ABO/RH(D): O POS
Antibody Screen: NEGATIVE
Unit division: 0
Unit division: 0

## 2013-05-13 LAB — CBC
HCT: 29.6 % — ABNORMAL LOW (ref 36.0–46.0)
HEMOGLOBIN: 9.2 g/dL — AB (ref 12.0–15.0)
MCH: 24.9 pg — AB (ref 26.0–34.0)
MCHC: 31.1 g/dL (ref 30.0–36.0)
MCV: 80 fL (ref 78.0–100.0)
Platelets: 205 10*3/uL (ref 150–400)
RBC: 3.7 MIL/uL — ABNORMAL LOW (ref 3.87–5.11)
RDW: 16.5 % — AB (ref 11.5–15.5)
WBC: 4.2 10*3/uL (ref 4.0–10.5)

## 2013-05-13 MED ORDER — FERROUS SULFATE 325 (65 FE) MG PO TABS: 325.0000 mg | ORAL_TABLET | Freq: Two times a day (BID) | ORAL | Status: DC

## 2013-05-13 MED ORDER — OXYCODONE HCL 5 MG PO TABS
5.0000 mg | ORAL_TABLET | ORAL | Status: DC | PRN
Start: 2013-05-13 — End: 2013-05-13

## 2013-05-13 MED ORDER — PANTOPRAZOLE SODIUM 40 MG PO TBEC: 40.0000 mg | DELAYED_RELEASE_TABLET | Freq: Two times a day (BID) | ORAL | Status: DC

## 2013-05-13 MED ORDER — MORPHINE SULFATE 2 MG/ML IJ SOLN
2.0000 mg | INTRAMUSCULAR | Status: DC | PRN
  Administered 2013-05-13 (×2): 2 mg via INTRAVENOUS
  Filled 2013-05-13 (×2): qty 1

## 2013-05-13 MED ORDER — ACETAMINOPHEN 325 MG PO TABS: 650.0000 mg | ORAL_TABLET | Freq: Four times a day (QID) | ORAL | Status: DC | PRN

## 2013-05-13 MED ORDER — FERROUS SULFATE 325 (65 FE) MG PO TABS
325.0000 mg | ORAL_TABLET | Freq: Two times a day (BID) | ORAL | Status: DC
  Administered 2013-05-13: 325 mg via ORAL
  Filled 2013-05-13 (×3): qty 1

## 2013-05-13 MED ORDER — OXYCODONE HCL 10 MG PO TABS: 10.0000 mg | ORAL_TABLET | ORAL | Status: DC | PRN

## 2013-05-13 MED ORDER — OXYCODONE HCL 5 MG PO TABS
10.0000 mg | ORAL_TABLET | ORAL | Status: DC | PRN
  Administered 2013-05-13 (×2): 10 mg via ORAL
  Filled 2013-05-13 (×3): qty 2

## 2013-05-13 MED ORDER — ACETAMINOPHEN 325 MG PO TABS: 650.0000 mg | ORAL_TABLET | Freq: Four times a day (QID) | ORAL | Status: DC

## 2013-05-13 MED ORDER — ENSURE COMPLETE PO LIQD
237.0000 mL | Freq: Two times a day (BID) | ORAL | Status: DC
  Administered 2013-05-13: 237 mL via ORAL

## 2013-05-13 MED ORDER — SUCRALFATE 1 GM/10ML PO SUSP: 1.0000 g | Freq: Three times a day (TID) | ORAL | Status: DC

## 2013-05-13 NOTE — Discharge Summary (Signed)
[facility] Discharge Summary  Patient name: Katherine [name] Medical record number: [id number] Date of birth: 1970/06/23 Age: 43 y.o. Gender: female Date of Admission: 05/12/2013  Date of Discharge: 05/13/2013 Admitting Physician: Etta Quill, DO  Primary Care Provider: Wendie Agreste, MD Consultants: Surgery, Nutrition  Indication for Hospitalization: symptomatic anemia  Discharge Diagnoses/Problem List:  Patient Active Problem List   Diagnosis Date Noted  . Symptomatic anemia 05/12/2013  . Tobacco abuse 04/05/2013  . Cachexia 04/05/2013  . S/P gastric bypass 2007 04/04/2013  . 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 Protein-calorie malnutrition, severe 04/01/2013  . Vomiting 03/30/2013  . Anxiety disorder    Disposition: home  Discharge Condition: stable  Discharge Exam:  BP 93/55  Pulse 90  Temp(Src) 98.7 F (37.1 C) (Oral)  Resp 16  Ht 5' 4.8" (1.646 m)  Wt 113 lb (51.256 kg)  BMI 18.92 kg/m2  SpO2 97%  LMP 05/11/2013 General: NAD, tired appearing, sitting up in bed  Cardiovascular: RRR, no murmurs, rubs or gallops  Respiratory: CTAB, normal work of breathing  Abdomen: thin, soft, NTND (apart from right upper quadrant tenderness), hyperactive  Extremities: WWP, DP 2+ bilaterally  Brief Hospital Course:  Katherine [name] is a 43y.o F who presents with symptomatic anemia. 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   FEN/GI: Pt presenting with persistent RUQ pain, anemia (6.7 on admission) in light of complex medical hx. S/p Esophagogastrojejunoscopy with [facility] [place] [facility] (who had initially planned to scope patient on 5/22 anyway). Scope with with findings of a margical ulcer at the gastrojejunal anastomosis but no active bleeding. Placed on protonix 40BID and carafate. Pain controlled with oxy IR 10q4. Tolerating gen diet on day of  discharge. Hemodynamically stable for close outpt f/up.  HEME: Chronic microcytic anemia, likely in setting of above; Transfused 2u PRBCS. iron <10 and ferritin 7; vit B12 >2000(patient takes B vitamins); post /transfusion/procedure CBC hgb9.2. Lower extremity dopplers obtained given swelling and pt with recent GI bleed, unremarkable. (did have enlarged bilat inguinal lymphs) Recommend outpt f/up for lymph nodes. Pt started on ferrous sulfate 325mg  BID.   CV: Has hx of HTN, Home med of HCTZ 25mg  daily. Was borderline hypotensive throughout admission but hemodynamically stable. HCTZ discontinued until f/up with PCP  PSYCH: Hx of ADD, certainly has anxious affect. Had many questions throughout admission. Was continued on zoloft 50mg , nicotine 14mg  patch and ativan 0.5mg  q12prn.   Issues for Follow Up:  1. Outpt CBC in 2-3 weeks with [facility] 2. Improvement of abdominal discomfort  3. Hypo-normotensive blood pressure meds; need for HCTZ (discontinued on d/c)  Significant Procedures: Esophagogastrojejunoscopy 05/12/13 "Gastrojejunal anastomosis: 41 cm. There is an ulcer with fibrinous material on the jejunal side of the anastomosis. It did not look like it recently bled. And she had friable stomach mucosa, without ulcer, at the gastrojejunostomy also" Performed by Dr. Alphonsa Overall  Significant Labs and Imaging:   Recent Labs Lab 05/08/13 2040 05/11/13 1905 05/12/13 0102 05/13/13 0723  WBC 6.1 4.2* 5.0 4.2  HGB 7.3* 6.7* 6.7* 9.2*  HCT 23.8* 22.9* 22.2* 29.6*  PLT 272  --  225 205    Recent Labs Lab 05/08/13 2040 05/11/13 1847  NA 136* 136  K 3.8 4.1  CL 99 102  CO2 26 26  GLUCOSE 60* 79  BUN 8 8  CREATININE 0.50 0.52  CALCIUM 9.0 8.7  ALKPHOS  108 88  AST 38* 29  ALT 46* 31  ALBUMIN 3.5 3.7     Results/Tests Pending at Time of Discharge: none  Discharge Medications:    Medication List    STOP taking these medications       hydrochlorothiazide 25 MG  tablet  Commonly known as:  HYDRODIURIL      TAKE these medications       amphetamine-dextroamphetamine 30 MG tablet  Commonly known as:  ADDERALL  Take 1 tablet (30 mg total) by mouth 2 (two) times daily.     ferrous sulfate 325 (65 FE) MG tablet  Take 1 tablet (325 mg total) by mouth 2 (two) times daily with a meal.     multivitamin with minerals Tabs tablet  Take 1 tablet by mouth daily.     nicotine 14 mg/24hr patch  Commonly known as:  NICODERM CQ - dosed in mg/24 hours  Place 14 mg onto the skin daily.     ondansetron 4 MG tablet  Commonly known as:  ZOFRAN  Take 1 tablet (4 mg total) by mouth every 6 (six) hours.     Oxycodone HCl 10 MG Tabs  Take 1 tablet (10 mg total) by mouth every 4 (four) hours as needed for severe pain.     pantoprazole 40 MG tablet  Commonly known as:  PROTONIX  Take 1 tablet (40 mg total) by mouth 2 (two) times daily.     sertraline 50 MG tablet  Commonly known as:  ZOLOFT  Take 1 tablet (50 mg total) by mouth daily.     sucralfate 1 GM/10ML suspension  Commonly known as:  CARAFATE  Take 10 mLs (1 g total) by mouth 4 (four) times daily -  with meals and at bedtime.     vitamin B-12 250 MCG tablet  Commonly known as:  CYANOCOBALAMIN  Take 250 mcg by mouth daily.        Discharge Instructions: Please refer to Patient Instructions section of EMR for full details.  Patient was counseled important signs and symptoms that should prompt return to medical care, changes in medications, dietary instructions, activity restrictions, and follow up appointments.   Follow-Up Appointments: Follow-up Information   Follow up with [facility], MD On 05/28/2013. (@10 :15)    Specialty:  General Surgery   Contact information:   [place] [address] [phone]       Schedule an appointment as soon as possible for a visit with Wendie Agreste, MD. (within the next week for hospital follow up)    Specialties:  Family  Medicine, Sports Medicine   Contact information:   [place] [address] [phone]       Langston Masker, MD 05/13/2013, 2:52 PM PGY-1, [place]

## 2013-05-13 NOTE — Care Management Note (Signed)
    Page 1 of 1   05/13/2013     5:01:47 PM CARE MANAGEMENT NOTE 05/13/2013  Patient:  Katherine [name], Katherine [name]   Account Number:  [id number]  Date Initiated:  05/13/2013  Documentation initiated by:  Tomi Bamberger  Subjective/Objective Assessment:   dx anemia, abd pain  admit- lives with mom     Action/Plan:   Anticipated DC Date:  05/13/2013   Anticipated DC Plan:  [place]  CM consult      Choice offered to / List presented to:             Status of service:  Completed, signed off Medicare Important Message given?   (If response is "NO", the following Medicare IM given date fields will be blank) Date Medicare IM given:   Date Additional Medicare IM given:    Discharge Disposition:  HOME/SELF CARE  Per UR Regulation:  Reviewed for med. necessity/level of care/duration of stay  If discussed at [place] of Stay Meetings, dates discussed:    Comments:

## 2013-05-13 NOTE — Progress Notes (Signed)
Pt complain of pain. Prn pain med admin in ED prior to pt's arrival. Pt not able to receive prn pain med again at this time. On call physician paged - one time dose of PO pain med ordered.

## 2013-05-13 NOTE — Progress Notes (Signed)
NURSING PROGRESS NOTE  Katherine [name] [id number] Discharge Data: 05/13/2013 4:07 PM Attending Provider: Lupita Dawn, MD UTM:[email or web address] R, MD     [name] to be D/C'd Home per MD order.  Discussed with the patient the After Visit Summary and all questions fully answered. All IV's discontinued with no bleeding noted. All belongings returned to patient for patient to take home.   Last Vital Signs:  Blood pressure 93/55, pulse 90, temperature 98.7 F (37.1 C), temperature source Oral, resp. rate 16, height 5' 4.8" (1.646 m), weight 51.256 kg (113 lb), last menstrual period 05/11/2013, SpO2 97.00%.  Discharge Medication List   Medication List    STOP taking these medications       hydrochlorothiazide 25 MG tablet  Commonly known as:  HYDRODIURIL      TAKE these medications       amphetamine-dextroamphetamine 30 MG tablet  Commonly known as:  ADDERALL  Take 1 tablet (30 mg total) by mouth 2 (two) times daily.     ferrous sulfate 325 (65 FE) MG tablet  Take 1 tablet (325 mg total) by mouth 2 (two) times daily with a meal.     multivitamin with minerals Tabs tablet  Take 1 tablet by mouth daily.     nicotine 14 mg/24hr patch  Commonly known as:  NICODERM CQ - dosed in mg/24 hours  Place 14 mg onto the skin daily.     ondansetron 4 MG tablet  Commonly known as:  ZOFRAN  Take 1 tablet (4 mg total) by mouth every 6 (six) hours.     Oxycodone HCl 10 MG Tabs  Take 1 tablet (10 mg total) by mouth every 4 (four) hours as needed for severe pain.     pantoprazole 40 MG tablet  Commonly known as:  PROTONIX  Take 1 tablet (40 mg total) by mouth 2 (two) times daily.     sertraline 50 MG tablet  Commonly known as:  ZOLOFT  Take 1 tablet (50 mg total) by mouth daily.     sucralfate 1 GM/10ML suspension  Commonly known as:  CARAFATE  Take 10 mLs (1 g total) by mouth 4 (four) times daily -  with meals and at bedtime.     vitamin B-12 250 MCG tablet  Commonly known as:   CYANOCOBALAMIN  Take 250 mcg by mouth daily.         Wallie Renshaw, RN

## 2013-05-13 NOTE — Progress Notes (Signed)
Patient ID: Katherine [name], female   DOB: 03-24-1970, 43 y.o.   MRN: [id number]  Subjective: Feeling better today.  H&h improved.  Tolerating liquids.   Objective:  Vital signs:  Filed Vitals:   05/12/13 1622 05/12/13 1649 05/12/13 2028 05/13/13 0422  BP: 116/77 124/74 111/67 95/62  Pulse: 64 86 86 72  Temp: 98.3 F (36.8 C) 97.7 F (36.5 C) 98.3 F (36.8 C) 98.1 F (36.7 C)  TempSrc: Oral Oral Oral Oral  Resp: 16 18 16 16   Height:      Weight:      SpO2: 100% 100% 98% 98%    Last BM Date: 05/11/13  Intake/Output   Yesterday:  05/04 0701 - 05/05 0700 In: 1177.5 [I.V.:500; Blood:677.5] Out: -  This shift:    I/O last 3 completed shifts: In: 1440 [I.V.:750; Blood:690] Out: -     Physical Exam: General: Pt awake/alert/oriented x4 in no acute distress Abdomen: Soft.  Nondistended.  Non tender.  No evidence of peritonitis.  No incarcerated hernias.    Problem List:   Principal Problem:   Symptomatic anemia Active Problems:   Gastrojejunal ulcer s/p Lap gastric bypass    Results:   Labs: Results for orders placed during the hospital encounter of 05/12/13 (from the past 48 hour(s))  POC OCCULT BLOOD, ED     Status: None   Collection Time    05/12/13 12:56 AM      Result Value Ref Range   Fecal Occult Bld NEGATIVE  NEGATIVE  CBC     Status: Abnormal   Collection Time    05/12/13  1:02 AM      Result Value Ref Range   WBC 5.0  4.0 - 10.5 K/uL   RBC 2.81 (*) 3.87 - 5.11 MIL/uL   Hemoglobin 6.7 (*) 12.0 - 15.0 g/dL   Comment: REPEATED TO VERIFY     CRITICAL RESULT CALLED TO, READ BACK BY AND VERIFIED WITH:     J. [facility] RN [id number] GREEN R   HCT 22.2 (*) 36.0 - 46.0 %   MCV 79.0  78.0 - 100.0 fL   MCH 23.8 (*) 26.0 - 34.0 pg   MCHC 30.2  30.0 - 36.0 g/dL   RDW 16.8 (*) 11.5 - 15.5 %   Platelets 225  150 - 400 K/uL  TYPE AND SCREEN     Status: None   Collection Time    05/12/13  1:02 AM      Result Value Ref Range   ABO/RH(D) O POS     Antibody Screen NEG     Sample Expiration 05/15/2013     Unit Number [id number]     Blood Component Type RED CELLS,LR     Unit division 00     Status of Unit ISSUED,FINAL     Transfusion Status OK TO TRANSFUSE     Crossmatch Result Compatible     Unit Number [id number]     Blood Component Type RED CELLS,LR     Unit division 00     Status of Unit ISSUED,FINAL     Transfusion Status OK TO TRANSFUSE     Crossmatch Result Compatible    TSH     Status: None   Collection Time    05/12/13  1:02 AM      Result Value Ref Range   TSH 2.810  0.350 - 4.500 uIU/mL   Comment: Please note change in reference range.  RETICULOCYTES     Status: Abnormal  Collection Time    05/12/13  1:02 AM      Result Value Ref Range   Retic Ct Pct 2.2  0.4 - 3.1 %   RBC. 2.81 (*) 3.87 - 5.11 MIL/uL   Retic Count, Manual 61.8  19.0 - 186.0 K/uL  PREPARE RBC (CROSSMATCH)     Status: None   Collection Time    05/12/13  2:00 AM      Result Value Ref Range   Order Confirmation ORDER PROCESSED BY BLOOD BANK    FERRITIN     Status: Abnormal   Collection Time    05/12/13  3:03 AM      Result Value Ref Range   Ferritin 7 (*) 10 - 291 ng/mL   Comment: Performed at [place] TIBC     Status: Abnormal   Collection Time    05/12/13  3:03 AM      Result Value Ref Range   Iron <10 (*) 42 - 135 ug/dL   TIBC Not calculated due to Iron <10.  250 - 470 ug/dL   Saturation Ratios Not calculated due to Iron <10.  20 - 55 %   UIBC 560 (*) 125 - 400 ug/dL   Comment: Performed at [place]     Status: Abnormal   Collection Time    05/12/13  3:03 AM      Result Value Ref Range   Vitamin B-12 >2000 (*) 211 - 911 pg/mL   Comment: Performed at [place] RBC     Status: Abnormal   Collection Time    05/12/13  3:03 AM      Result Value Ref Range   RBC Folate 1227 (*) >280 ng/mL   Comment: Reference range not established for pediatric patients.      Performed at [place] PCR SCREENING     Status: None   Collection Time    05/12/13  3:42 AM      Result Value Ref Range   MRSA by PCR NEGATIVE  NEGATIVE   Comment:            The GeneXpert MRSA Assay (FDA     approved for NASAL specimens     only), is one component of a     comprehensive MRSA colonization     surveillance program. It is not     intended to diagnose MRSA     infection nor to guide or     monitor treatment for     MRSA infections.  PREPARE RBC (CROSSMATCH)     Status: None   Collection Time    05/12/13 11:15 AM      Result Value Ref Range   Order Confirmation ORDER PROCESSED BY BLOOD BANK    CBC     Status: Abnormal   Collection Time    05/13/13  7:23 AM      Result Value Ref Range   WBC 4.2  4.0 - 10.5 K/uL   RBC 3.70 (*) 3.87 - 5.11 MIL/uL   Hemoglobin 9.2 (*) 12.0 - 15.0 g/dL   Comment: POST TRANSFUSION SPECIMEN   HCT 29.6 (*) 36.0 - 46.0 %   MCV 80.0  78.0 - 100.0 fL   MCH 24.9 (*) 26.0 - 34.0 pg   MCHC 31.1  30.0 - 36.0 g/dL   RDW 16.5 (*) 11.5 - 15.5 %   Platelets 205  150 - 400 K/uL  Imaging / Studies: No results found.  Medications / Allergies: per chart  Antibiotics: Anti-infectives   None      Assessment/Plan Anemia PCM Ulcer on the jejunal side of the anastomosis s/p endoscopy -advance diet -protonix BID and carafate -iron supplement BID with meals -continued tobacco cessation -follow up with Dr. Lucia Gaskins established -discharge home today  Erby Pian, [facility] Surgery Pager [phone] Office [phone]  05/13/2013 10:04 AM  Agree with above.  Alphonsa Overall, MD, [facility] Surgery Pager: [phone] Office phone:  [phone]

## 2013-05-13 NOTE — Discharge Instructions (Signed)
Ms Katherine [name], Katherine [name] were seen in the hospital for anemia and scope for evaluation of bleeding. Dr. Lucia Gaskins saw an ulcer on imaging that was fortunately not actively bleeding. However it will be important when you go home you should take carafate and protonix to help prevent irritation and future bleeding from intestines. Please follow up with Dr. Lucia Gaskins for post procedure CBC to evaluate and make sure your hemoglobin is stable. When you go home should you experience bloody stools, vomiting with blood, confusion, worsening abdominal discomfort and nausea please call immediately as these could be signs of a medical emergency.

## 2013-05-13 NOTE — Progress Notes (Signed)
Family Medicine Teaching Service Daily Progress Note Intern Pager: [phone]  Patient name: Katherine [name] Medical record number: [id number] Date of birth: 1970/02/25 Age: 43 y.o. Gender: female  Primary Care Provider: Wendie Agreste, MD Consultants: none Code Status: full  Pt Overview and Major Events to Date:   Assessment and Plan: Katherine [name] is a 43y.o F who presents with symptomatic anemia. 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   FEN/GI: Persistent RUQ pain, anemia (6.7 on admission) s/p Esophagogastrojejunoscopy with findings of a margical ulcer at the gastrojejunal anastomosis - protonix 40BID and carafate - zofran 4q6 prn -[facility] [facility] [employer] surg plans to see in office in 2-3 weeks with repeat CBC -pain control oxy IR 5, morphine 2q4 prn - full gen diet this morning, advance as tolerated  HEME: Chronic microcytic anemia, likely in setting of above; iron <10 and ferritin 7; vit B12 >2000; post procedure CBC this morning hgb9.2 -lower extremity dopplers unremarkable -monitor for outpt enlarged bilat lymph nodes -ferrous sulfate 325mg  BID  CV: HTN, currently normotensive -home med HCTZ 25mg  daily  PSYCH: ADD, many questions this morning, anxious affect -ativan 0.5mg  q12prn -nicotine 14mg  patch -zoloft 50mg   PPx: SCDs bilaterally  Disposition: pending further post-procedure recs; advancement of diet, possible d/c home later today  Subjective: Had multiple different questions this morning about pain control and advancement of diet   Objective: Temp:  [97.7 F (36.5 C)-98.5 F (36.9 C)] 98.1 F (36.7 C) (05/05 0422) Pulse Rate:  [57-86] 72 (05/05 0422) Resp:  [14-20] 16 (05/05 0422) BP: (92-144)/(55-87) 95/62 mmHg (05/05 0422) SpO2:  [98 %-100 %] 98 % (05/05 0422) Physical Exam: General: NAD, tired appearing, sitting up in bed Cardiovascular: RRR, no murmurs, rubs or  gallops Respiratory: CTAB, normal work of breathing Abdomen: thin, soft, NTND (apart from right upper quadrant tenderness), hyperactive Extremities: [facility]  Laboratory:  Recent Labs Lab 05/08/13 2040 05/11/13 1905 05/12/13 0102  WBC 6.1 4.2* 5.0  HGB 7.3* 6.7* 6.7*  HCT 23.8* 22.9* 22.2*  PLT 272  --  225    Recent Labs Lab 05/08/13 2040 05/11/13 1847  NA 136* 136  K 3.8 4.1  CL 99 102  CO2 26 26  BUN 8 8  CREATININE 0.50 0.52  CALCIUM 9.0 8.7  PROT 7.1 6.2  BILITOT 0.3 0.3  ALKPHOS 108 88  ALT 46* 31  AST 38* 29  GLUCOSE 60* 79     Imaging/Diagnostic Tests: Summary:  - No evidence of deep vein or superficial thrombosis involving the right lower extremity and left lower extremity. No change since study performed 05-09-13. An enlarged inguinal lymph node is noted bilaterally. Pulsatile waveforms suggest fluid overload. - No evidence of Baker's cyst on the right or left   Langston Masker, MD 05/13/2013, 7:37 AM PGY-1, [place] Intern pager: [phone], text pages welcome

## 2013-05-13 NOTE — Progress Notes (Signed)
FMTS Attending Note  I personally saw and evaluated the patient. The plan of care was discussed with the resident team. I agree with the assessment and plan as documented by the resident.   Demondre Aguas MD 

## 2013-05-14 NOTE — Discharge Summary (Signed)
I agree with the discharge summary as documented.   Logen Fowle MD  

## 2013-05-16 ENCOUNTER — Telehealth: Payer: Self-pay | Admitting: Obstetrics and Gynecology

## 2013-05-16 NOTE — Telephone Encounter (Signed)
Confirming pts appt °

## 2013-05-17 ENCOUNTER — Ambulatory Visit (INDEPENDENT_AMBULATORY_CARE_PROVIDER_SITE_OTHER): Payer: PRIVATE HEALTH INSURANCE | Admitting: Family Medicine

## 2013-05-17 VITALS — BP 116/66 | HR 86 | Temp 98.1°F | Resp 20 | Ht 63.0 in | Wt 111.4 lb

## 2013-05-17 DIAGNOSIS — R110 Nausea: Secondary | ICD-10-CM

## 2013-05-17 DIAGNOSIS — D649 Anemia, unspecified: Secondary | ICD-10-CM

## 2013-05-17 DIAGNOSIS — R109 Unspecified abdominal pain: Secondary | ICD-10-CM

## 2013-05-17 MED ORDER — OXYCODONE HCL 10 MG PO TABS: ORAL_TABLET | ORAL | Status: DC

## 2013-05-17 MED ORDER — ONDANSETRON HCL 4 MG PO TABS: 4.0000 mg | ORAL_TABLET | Freq: Four times a day (QID) | ORAL | Status: DC

## 2013-05-17 NOTE — Progress Notes (Signed)
Chief Complaint:  Chief Complaint  [name] presents with  . Hospitalization Follow-up    edema, blood tranfusion  . Medication Refill    pain medicine    HPI: Katherine [name] is a 43 y.o. female with a PMH of gastric bypass who is here for medication refills of her pain meds and also hospital follow-up She was recently hospitalized and then dc on 5/5 /2015 for symptomatic anemia with abdominal pain. She had 2 units of PRBCs and was by General surgery and a Esophagogastrojejunoscopy was done on 05/12/13  Currently denies any CP, dizziness, she has been able to move her bowels, She is able to eat cream of wheat Has 2-10/10 pain depending on Katherine moment,Please see not form DC summary below.  She has appt with Dr Lucia Gaskins on 05/28/13  Brief Hospital Course:  Katherine [name] is a 43y.o F who presents with symptomatic anemia. 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  FEN/GI: Pt presenting with persistent RUQ pain, anemia (6.7 on admission) in light of complex medical hx. S/p Esophagogastrojejunoscopy with [place] surg (who had initially planned to scope [name] on 5/22 anyway). Scope with with findings of a margical ulcer at Katherine gastrojejunal anastomosis but no active bleeding. Placed on protonix 40BID and carafate. Pain controlled with oxy IR 10q4. Tolerating gen diet on day of discharge. Hemodynamically stable for close outpt f/up.  HEME: Chronic microcytic anemia, likely in setting of above; Transfused 2u PRBCS. iron <10 and ferritin 7; vit B12 >2000([name] takes B vitamins); post /transfusion/procedure CBC hgb9.2. Lower extremity dopplers obtained given swelling and pt with recent GI bleed, unremarkable. (did have enlarged bilat inguinal lymphs) Recommend outpt f/up for lymph nodes. Pt started on ferrous sulfate 325mg  BID.  CV: Has hx of HTN, Home med of HCTZ 25mg  daily. Was borderline hypotensive  throughout admission but hemodynamically stable. HCTZ discontinued until f/up with PCP  PSYCH: Hx of ADD, certainly has anxious affect. Had many questions throughout admission. Was continued on zoloft 50mg , nicotine 14mg  patch and ativan 0.5mg  q12prn.  Issues for Follow Up:  1. Outpt CBC in 2-3 weeks with [facility]  2. Improvement of abdominal discomfort  3. Hypo-normotensive blood pressure meds; need for HCTZ (discontinued on d/c)  Significant Procedures: Esophagogastrojejunoscopy 05/12/13  "Gastrojejunal anastomosis: 41 cm. There is an ulcer with fibrinous material on Katherine jejunal side of Katherine anastomosis. It did not look like it recently bled. And she had friable stomach mucosa, without ulcer, at Katherine gastrojejunostomy also" Performed by Dr. Alphonsa Overall   Past Medical History  Diagnosis Date  . ADD (attention deficit disorder)   . Anemia   . Blood transfusion without reported diagnosis   . Anxiety disorder     panic  . Anxiety   . MRSA (methicillin resistant Staphylococcus aureus)   . Pneumonia     [place] as child   Past Surgical History  Procedure Laterality Date  . Laparoscopic gastric bypass  [place], [place].  Dr Timmothy Sours  . Cesarean section    . Breast enhancement surgery    . Esophagogastroduodenoscopy N/A 03/31/2013    Procedure: ESOPHAGOGASTRODUODENOSCOPY (EGD);  Surgeon: Shann Medal, MD;  Location: [facility];  Service: General;  Laterality: N/A;  . Laparoscopic revision of gastrojejunostomy N/A 04/08/2013    Procedure: LAPAROSCOPIC REVISION OF GASTROJEJUNOSTOMY AND UPPER ENDOSCOPY;  Surgeon: Shann Medal, MD;  Location: [facility];  Service:  General;  Laterality: N/A;  . Esophagogastroduodenoscopy N/A 05/12/2013    Procedure: ESOPHAGOGASTRODUODENOSCOPY (EGD);  Surgeon: Shann Medal, MD;  Location: [facility];  Service: General;  Laterality: N/A;   History   Social History  . Marital Status: Legally Separated    Spouse Name: N/A    Number of  Children: N/A  . Years of Education: N/A   Social History Main Topics  . Smoking status: Former Smoker -- 1.00 packs/day for 15 years    Types: Cigarettes    Quit date: 03/30/2013  . Smokeless tobacco: Never Used     Comment: [name] started patches in [facility] hospital 03/30/13  . Alcohol Use: No  . Drug Use: No  . Sexual Activity: None   Other Topics Concern  . None   Social History Narrative  . None   Family History  Problem Relation Age of Onset  . Other Other   . Other Other    Allergies  Allergen Reactions  . Nsaids     Anemia, and Gastric bypass  . Promethazine Hcl Other (See Comments)    Agitated, restless  . Tramadol Rash   Prior to Admission medications   Medication Sig Start Date End Date Taking? Authorizing Provider  amphetamine-dextroamphetamine (ADDERALL) 30 MG tablet Take 1 tablet (30 mg total) by mouth 2 (two) times daily. 12/06/12  Yes [name], MD  ferrous sulfate 325 (65 FE) MG tablet Take 1 tablet (325 mg total) by mouth 2 (two) times daily with a meal. 05/13/13  Yes Langston Masker, MD  Multiple Vitamin (MULTIVITAMIN WITH MINERALS) TABS tablet Take 1 tablet by mouth daily.   Yes Historical Provider, MD  nicotine (NICODERM CQ - DOSED IN MG/24 HOURS) 14 mg/24hr patch Place 14 mg onto Katherine skin daily.   Yes Historical Provider, MD  ondansetron (ZOFRAN) 4 MG tablet Take 1 tablet (4 mg total) by mouth every 6 (six) hours. 05/17/13  Yes Thao P Le, DO  Oxycodone HCl 10 MG TABS 1/2-1 tab PO every 6-8  hours prn for pain 05/17/13  Yes Thao P Le, DO  pantoprazole (PROTONIX) 40 MG tablet Take 1 tablet (40 mg total) by mouth 2 (two) times daily. 05/13/13  Yes Langston Masker, MD  sertraline (ZOLOFT) 50 MG tablet Take 1 tablet (50 mg total) by mouth daily. 12/06/12  Yes [name], MD  sucralfate (CARAFATE) 1 GM/10ML suspension Take 10 mLs (1 g total) by mouth 4 (four) times daily -  with meals and at bedtime. 05/13/13  Yes Langston Masker, MD  vitamin B-12 (CYANOCOBALAMIN)  250 MCG tablet Take 250 mcg by mouth daily.   Yes Historical Provider, MD     ROS: Katherine [name] denies fevers, chills, night sweats, unintentional weight loss, chest pain, palpitations, wheezing, dyspnea on exertion, nausea, vomiting, abdominal pain, dysuria, hematuria, melena, numbness,  or tingling.   All other systems have been reviewed and were otherwise negative with Katherine exception of those mentioned in Katherine HPI and as above.    PHYSICAL EXAM: Filed Vitals:   05/17/13 1326  BP: 116/66  Pulse: 86  Temp: 98.1 F (36.7 C)  Resp: 20   Filed Vitals:   05/17/13 1326  Height: 5\' 3"  (1.6 m)  Weight: 111 lb 6.4 oz (50.531 kg)   Body mass index is 19.74 kg/(m^2).  General: Alert, no acute distress, thin frail appearing white female, here with her sister HEENT:  Normocephalic, atraumatic, oropharynx patent. EOMI, PERRLA Cardiovascular:  Regular rate and rhythm, no rubs murmurs or  gallops.  No Carotid bruits, radial pulse intact. No pedal edema.  Respiratory: Clear to auscultation bilaterally.  No wheezes, rales, or rhonchi.  No cyanosis, no use of accessory musculature GI: No organomegaly, abdomen is soft and non-tender, positive bowel sounds.  No masses. Skin: No rashes. Neurologic: Facial musculature symmetric. Psychiatric: [name] is appropriate throughout our interaction. Lymphatic: No cervical lymphadenopathy Musculoskeletal: Gait intact.   LABS: Results for orders placed during Katherine hospital encounter of 05/12/13  MRSA PCR SCREENING      Result Value Ref Range   MRSA by PCR NEGATIVE  NEGATIVE  CBC      Result Value Ref Range   WBC 5.0  4.0 - 10.5 K/uL   RBC 2.81 (*) 3.87 - 5.11 MIL/uL   Hemoglobin 6.7 (*) 12.0 - 15.0 g/dL   HCT 22.2 (*) 36.0 - 46.0 %   MCV 79.0  78.0 - 100.0 fL   MCH 23.8 (*) 26.0 - 34.0 pg   MCHC 30.2  30.0 - 36.0 g/dL   RDW 16.8 (*) 11.5 - 15.5 %   Platelets 225  150 - 400 K/uL  TSH      Result Value Ref Range   TSH 2.810  0.350 - 4.500 uIU/mL    RETICULOCYTES      Result Value Ref Range   Retic Ct Pct 2.2  0.4 - 3.1 %   RBC. 2.81 (*) 3.87 - 5.11 MIL/uL   Retic Count, Manual 61.8  19.0 - 186.0 K/uL  FERRITIN      Result Value Ref Range   Ferritin 7 (*) 10 - 291 ng/mL  IRON AND TIBC      Result Value Ref Range   Iron <10 (*) 42 - 135 ug/dL   TIBC Not calculated due to Iron <10.  250 - 470 ug/dL   Saturation Ratios Not calculated due to Iron <10.  20 - 55 %   UIBC 560 (*) 125 - 400 ug/dL  VITAMIN B12      Result Value Ref Range   Vitamin B-12 >2000 (*) 211 - 911 pg/mL  FOLATE RBC      Result Value Ref Range   RBC Folate 1227 (*) >280 ng/mL  CBC      Result Value Ref Range   WBC 4.2  4.0 - 10.5 K/uL   RBC 3.70 (*) 3.87 - 5.11 MIL/uL   Hemoglobin 9.2 (*) 12.0 - 15.0 g/dL   HCT 29.6 (*) 36.0 - 46.0 %   MCV 80.0  78.0 - 100.0 fL   MCH 24.9 (*) 26.0 - 34.0 pg   MCHC 31.1  30.0 - 36.0 g/dL   RDW 16.5 (*) 11.5 - 15.5 %   Platelets 205  150 - 400 K/uL  POC OCCULT BLOOD, ED      Result Value Ref Range   Fecal Occult Bld NEGATIVE  NEGATIVE  TYPE AND SCREEN      Result Value Ref Range   ABO/RH(D) O POS     Antibody Screen NEG     Sample Expiration 05/15/2013     Unit Number [id number]     Blood Component Type RED CELLS,LR     Unit division 00     Status of Unit ISSUED,FINAL     Transfusion Status OK TO TRANSFUSE     Crossmatch Result Compatible     Unit Number [id number]     Blood Component Type RED CELLS,LR     Unit division 00     Status of  Unit ISSUED,FINAL     Transfusion Status OK TO TRANSFUSE     Crossmatch Result Compatible    PREPARE RBC (CROSSMATCH)      Result Value Ref Range   Order Confirmation ORDER PROCESSED BY BLOOD BANK    PREPARE RBC (CROSSMATCH)      Result Value Ref Range   Order Confirmation ORDER PROCESSED BY BLOOD BANK       EKG/XRAY:   Primary read interpreted by Dr. Marin Comment at [facility].   ASSESSMENT/PLAN: Encounter Diagnoses  Name Primary?  . Abdominal pain, unspecified site Yes  .  Nausea alone   . Anemia    SOMAYA GRASSI is a 43 y.o. female with a PMH of gastric bypass  In 2007 who is here for medication refills of her pain meds and also hospital follow-up She was recently hospitalized and then dc on 5/5 /2015 for symptomatic anemia with abdominal pain. She had 2  units of PRBCs ,, her Hgb went from 6.7 to 9.2) and was by General surgery and a Esophagogastrojejunoscopy was done on 05/12/13 . Currently denies any CP, dizziness, she has been able to move her bowels, she has been eating cream of wheat. Please see not form DC summary below.  She has appt with Dr Lucia Gaskins on 05/28/13  Rx Oxycodone 10 mg , ZOfran Monitor for contispation and obstruction F/u in 1 week for repeat Hgb  F/u with Dr Lucia Gaskins as directed [facility]) I do not think she needs to be on BP meds at this time so we will continue to keep her off of her HTN meds Drug profile pulled she is not getting narcotics inappropriately.   Gross sideeffects, risk and benefits, and alternatives of medications d/w [name]. [name] is aware that all medications have potential sideeffects and we are unable to predict every sideeffect or drug-drug interaction that may occur.  Glenford Bayley, DO 05/17/2013 2:32 PM

## 2013-05-18 ENCOUNTER — Other Ambulatory Visit: Payer: Self-pay | Admitting: Emergency Medicine

## 2013-05-20 ENCOUNTER — Telehealth (INDEPENDENT_AMBULATORY_CARE_PROVIDER_SITE_OTHER): Payer: Self-pay

## 2013-05-20 NOTE — Telephone Encounter (Signed)
Pt s/p Esophagogastrojejunoscopy on 5/4. Pt has been taking Oxycodone 10mg . She states that she has been doing very well and that she is getting ready to go back to work and would like to know if Dr Lucia Gaskins would decrease her pain meds to Norco 5/325mg . Please advise.

## 2013-05-21 NOTE — Telephone Encounter (Signed)
LMOM to call nursing office. Dr Lucia Gaskins agreed to decrease pain meds to Norco 5/325mg , will have him sign Rx this am and have ready at the front desk to pick up.

## 2013-05-21 NOTE — Telephone Encounter (Signed)
Pt called back. Informed script up front for pick up.

## 2013-05-22 ENCOUNTER — Other Ambulatory Visit: Payer: PRIVATE HEALTH INSURANCE

## 2013-05-23 ENCOUNTER — Ambulatory Visit (INDEPENDENT_AMBULATORY_CARE_PROVIDER_SITE_OTHER): Payer: PRIVATE HEALTH INSURANCE | Admitting: Obstetrics and Gynecology

## 2013-05-23 ENCOUNTER — Encounter: Payer: Self-pay | Admitting: Obstetrics and Gynecology

## 2013-05-23 ENCOUNTER — Other Ambulatory Visit: Payer: Self-pay | Admitting: Obstetrics and Gynecology

## 2013-05-23 VITALS — BP 104/70 | HR 100 | Resp 100 | Ht 64.25 in | Wt 112.4 lb

## 2013-05-23 DIAGNOSIS — Z01419 Encounter for gynecological examination (general) (routine) without abnormal findings: Secondary | ICD-10-CM

## 2013-05-23 DIAGNOSIS — N946 Dysmenorrhea, unspecified: Secondary | ICD-10-CM

## 2013-05-23 DIAGNOSIS — N920 Excessive and frequent menstruation with regular cycle: Secondary | ICD-10-CM

## 2013-05-23 DIAGNOSIS — Z1231 Encounter for screening mammogram for malignant neoplasm of breast: Secondary | ICD-10-CM

## 2013-05-23 NOTE — Progress Notes (Signed)
Patient ID: Katherine [name], female   DOB: August 23, 1970, 43 y.o.   MRN: [id number] GYNECOLOGY VISIT  PCP:   Merri Ray, MD  Referring provider:   HPI: 43 y.o.   Legally Separated  Caucasian  female   [phone] with Patient's last menstrual period was 05/11/2013.   here for   AEX.  History of gastic bypass many years ago.  Has a gastric bypass revision in March 2015 due to a blockage from her prior bypass surgery.  Recently had a bleeding gastric ulcer. Has lost 120 pounds overall. CT scan of abdomen and pelvis - unremarkable reproductive organs.   Menses are monthly and painful.  Pain is crippling. Unable to take pain medication due to gastric surgery. First 3 days are heavy and clotting.  Pad change every 2 1/2 - 3 hours.   Was planning on hysterectomy but did not follow through. History of ovarian cysts. History of 2 - 3 dilation and curettages. History of laparoscopy. History of infertility.  History of ectopic pregnancy of right? Tube, treated with methotrexate. History of cesarean section.   Hgb:    9.2 while in the hospital recently. Urine:  Trace Glucose  GYNECOLOGIC HISTORY: Patient's last menstrual period was 05/11/2013. Sexually active:  no Partner preference: female Contraception:   abstinence Menopausal hormone therapy: n/a DES exposure: no   Blood transfusions:  05-12-13 due to bleeding GE ulcer Sexually transmitted diseases:  no  GYN procedures and prior surgeries:  Breast augmentation, C-section and "Tummy Tuck". Last mammogram:  2008 in [place], [place]:wnl               Last pap and high risk HPV testing:  2012 wnl History of abnormal pap smear:  no   OB History   Grav Para Term Preterm Abortions TAB SAB Ect Mult Living   3 1   2  1 1  1        LIFESTYLE: Exercise:   walking           Tobacco:    No--on the patch to quit.  Rare tobacco use now.  Really stopped most cigarettes since March 2015. Alcohol:      no Drug use:  no  OTHER HEALTH  MAINTENANCE: Tetanus/TDap:   Up to date Gardisil:              n/a Influenza:            never Zostavax:            n/a  Bone density:        n/a Colonoscopy:         n/a  Cholesterol check:   unsure  Family History  Problem Relation Age of Onset  . Other Other   . Other Other   . Breast cancer Maternal Grandmother 30  . Hypertension Mother   . Stroke Paternal Grandmother     Patient Active Problem List   Diagnosis Date Noted  . Symptomatic anemia 05/12/2013  . Tobacco abuse 04/05/2013  . Cachexia 04/05/2013  . S/P gastric bypass 2007 04/04/2013  . 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 Protein-calorie malnutrition, severe 04/01/2013  . Vomiting 03/30/2013  . Facial cellulitis 03/21/2012  . Anxiety disorder    Past Medical History  Diagnosis Date  . ADD (attention deficit disorder)   . Anemia   . Anxiety disorder     panic  . Anxiety   . MRSA (methicillin resistant Staphylococcus aureus)   . Pneumonia  [place] as child  . Blood transfusion without reported diagnosis 05/2013    d/t bleeding ulcer  . Depression   . Endometriosis   . Fibroid   . Bleeding ulcer 05-12-13    at GE junction from anastomosis site from gastric bypass--repair by Dr. Keturah Barre.Lucia Gaskins    Past Surgical History  Procedure Laterality Date  . Laparoscopic gastric bypass  [place], [place].  Dr Timmothy Sours  . Breast enhancement surgery    . Esophagogastroduodenoscopy N/A 03/31/2013    Procedure: ESOPHAGOGASTRODUODENOSCOPY (EGD);  Surgeon: Shann Medal, MD;  Location: [facility];  Service: General;  Laterality: N/A;  . Laparoscopic revision of gastrojejunostomy N/A 04/08/2013    Procedure: LAPAROSCOPIC REVISION OF GASTROJEJUNOSTOMY AND UPPER ENDOSCOPY;  Surgeon: Shann Medal, MD;  Location: [facility];  Service: General;  Laterality: N/A;  . Esophagogastroduodenoscopy N/A 05/12/2013    Procedure: ESOPHAGOGASTRODUODENOSCOPY (EGD);  Surgeon: Shann Medal, MD;  Location: [place];   Service: General;  Laterality: N/A;  . Cesarean section  2005  . Augmentation mammaplasty  2008  . Abdominal surgery  2008    Tummy tuck    ALLERGIES: Nsaids; Promethazine hcl; and Tramadol  Current Outpatient Prescriptions  Medication Sig Dispense Refill  . amphetamine-dextroamphetamine (ADDERALL) 30 MG tablet Take 1 tablet (30 mg total) by mouth 2 (two) times daily.  60 tablet  0  . ferrous sulfate 325 (65 FE) MG tablet Take 1 tablet (325 mg total) by mouth 2 (two) times daily with a meal.  60 tablet  3  . Multiple Vitamin (MULTIVITAMIN WITH MINERALS) TABS tablet Take 1 tablet by mouth daily.      . nicotine (NICODERM CQ - DOSED IN MG/24 HOURS) 14 mg/24hr patch Place 14 mg onto the skin daily.      . ondansetron (ZOFRAN) 4 MG tablet Take 1 tablet (4 mg total) by mouth every 6 (six) hours.  20 tablet  0  . Oxycodone HCl 10 MG TABS 1/2-1 tab PO every 6-8  hours prn for pain  45 tablet  0  . pantoprazole (PROTONIX) 40 MG tablet Take 1 tablet (40 mg total) by mouth 2 (two) times daily.  60 tablet  0  . sertraline (ZOLOFT) 50 MG tablet Take 1 tablet (50 mg total) by mouth daily.  30 tablet  5  . sucralfate (CARAFATE) 1 GM/10ML suspension Take 10 mLs (1 g total) by mouth 4 (four) times daily -  with meals and at bedtime.  420 mL  0  . vitamin B-12 (CYANOCOBALAMIN) 250 MCG tablet Take 250 mcg by mouth daily.       No current facility-administered medications for this visit.     ROS:  Pertinent items are noted in HPI.  SOCIAL HISTORY:  [employer]  PHYSICAL EXAMINATION:    BP 104/70  Pulse 100  Resp 100  Ht 5' 4.25" (1.632 m)  Wt 112 lb 6.4 oz (50.984 kg)  BMI 19.14 kg/m2  LMP 05/11/2013   Wt Readings from Last 3 Encounters:  05/23/13 112 lb 6.4 oz (50.984 kg)  05/17/13 111 lb 6.4 oz (50.531 kg)  05/12/13 113 lb (51.256 kg)     Ht Readings from Last 3 Encounters:  05/23/13 5' 4.25" (1.632 m)  05/17/13 5\' 3"  (1.6 m)  05/12/13 5' 4.8" (1.646 m)    General appearance:  alert, cooperative and appears stated age.  Very thin.  Head: Normocephalic, without obvious abnormality, atraumatic Neck: no adenopathy, supple, symmetrical, trachea midline and thyroid not  enlarged, symmetric, no tenderness/mass/nodules Lungs: clear to auscultation bilaterally Breasts: Implants bilaterally, scars consistent with reduction, No nipple retraction or dimpling, No nipple discharge or bleeding, No axillary or supraclavicular adenopathy, Normal to palpation without dominant masses Heart: regular rate and rhythm Abdomen: abdominoplasty scars, multiple laparoscopy scars, soft, non-tender; no masses,  no organomegaly Extremities: extremities normal, atraumatic, no cyanosis or edema Skin: Skin color, texture, turgor normal. No rashes or lesions Lymph nodes: Cervical, supraclavicular, and axillary nodes normal. No abnormal inguinal nodes palpated Neurologic: Grossly normal  Pelvic: External genitalia:  no lesions              Urethra:  normal appearing urethra with no masses, tenderness or lesions              Bartholins and Skenes: normal                 Vagina: normal appearing vagina with normal color and discharge, no lesions              Cervix: normal appearance              Pap and high risk HPV testing done: yes.            Bimanual Exam:  Uterus:  uterus is normal size, shape, consistency and nontender                                      Adnexa: normal adnexa in size, mildly tender right adnexa and no masses                                      Rectovaginal: Confirms                                      Anus:  normal sphincter tone, no lesions  ASSESSMENT  Normal gynecologic exam. Dysmenorrhea. Menorrhagia. History of ovarian cysts. Multiple abdominal surgeries.  Recent bleeding ulcer.   PLAN  Mammogram recommended yearly.  Will schedule at [facility]. Pap smear and high risk HPV testing Counseled on self breast exam, Calcium and vitamin D intake,  exercise. Return for pelvic ultrasound. Consider Depo Provera.  Return annually or prn   An After Visit Summary was printed and given to the patient.

## 2013-05-23 NOTE — Patient Instructions (Signed)

## 2013-05-24 ENCOUNTER — Ambulatory Visit (INDEPENDENT_AMBULATORY_CARE_PROVIDER_SITE_OTHER): Payer: PRIVATE HEALTH INSURANCE | Admitting: Family Medicine

## 2013-05-24 VITALS — BP 110/77 | HR 77 | Temp 98.6°F | Resp 18 | Ht 64.0 in | Wt 112.6 lb

## 2013-05-24 DIAGNOSIS — F411 Generalized anxiety disorder: Secondary | ICD-10-CM

## 2013-05-24 DIAGNOSIS — F988 Other specified behavioral and emotional disorders with onset usually occurring in childhood and adolescence: Secondary | ICD-10-CM

## 2013-05-24 DIAGNOSIS — Z79899 Other long term (current) drug therapy: Secondary | ICD-10-CM

## 2013-05-24 DIAGNOSIS — F419 Anxiety disorder, unspecified: Secondary | ICD-10-CM

## 2013-05-24 DIAGNOSIS — D649 Anemia, unspecified: Secondary | ICD-10-CM

## 2013-05-24 DIAGNOSIS — R1013 Epigastric pain: Secondary | ICD-10-CM

## 2013-05-24 LAB — POCT CBC
Granulocyte percent: 71.7 %G (ref 37–80)
HEMATOCRIT: 33.7 % — AB (ref 37.7–47.9)
HEMOGLOBIN: 10.3 g/dL — AB (ref 12.2–16.2)
LYMPH, POC: 1.3 (ref 0.6–3.4)
MCH: 24.7 pg — AB (ref 27–31.2)
MCHC: 30.6 g/dL — AB (ref 31.8–35.4)
MCV: 80.8 fL (ref 80–97)
MID (cbc): 0.4 (ref 0–0.9)
MPV: 6.8 fL (ref 0–99.8)
POC GRANULOCYTE: 4.4 (ref 2–6.9)
POC LYMPH %: 21.1 % (ref 10–50)
POC MID %: 7.2 %M (ref 0–12)
Platelet Count, POC: 510 10*3/uL — AB (ref 142–424)
RBC: 4.17 M/uL (ref 4.04–5.48)
RDW, POC: 19.7 %
WBC: 6.2 10*3/uL (ref 4.6–10.2)

## 2013-05-24 MED ORDER — OXYCODONE HCL 10 MG PO TABS: 10.0000 mg | ORAL_TABLET | Freq: Four times a day (QID) | ORAL | Status: DC | PRN

## 2013-05-24 NOTE — Progress Notes (Addendum)
Subjective:  This chart was scribed for [employer]. Carlota Raspberry MD, by Stacy Gardner, [facility] Scribe. The patient was seen in room and the patient's care was started at 4:20 PM.  Authored by Janeann Forehand MD, unable to change in [facility].    Patient ID: Katherine [name], female    DOB: September 29, 1970, 43 y.o.   MRN: [id number]  Chief Complaint  Patient presents with   Medication Refill    adderall 30 mg, oxycodone 10 mg, sucralfate 1g/21ml   Follow-up   HPI HPI Comments: Katherine [name] is a 42 y.o. female who arrives to the [facility] for a medication refill and follow up .  Initially seen by me 04/30/13. She sent the hospital due to abdominal pain and weight loss with plan to following up with me for primary care. 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 Pt had an eroded perianastomotic ring with a hx of gastric by pass in 2007. Pt was treated for malnourishment and received TPN. She was also treated for an abscess of her left lower jaw during hospitalization. Pt was treated with oxycodone followed by percocet outpatient.  She was transfused 2 units. Pt was noted to have a large inguinal lymphoedema and they discuss outpatient and pt was sent home on Ferrous sulfate, 325 mg, twice per day.She was re-admitted 4/4-4/5 for anemia with a hemoglobin of 6.7 and was thought to be chronic microcytic anemia.  General surgery phone note April 16, she was advised to taper off her pain medication. She was seen one week ago at [facility] by Dr. Marin Comment for a refill of pain medication and hospital follow up. The plan at Dr. Gus Puma visit is to keep follow up with Dr. Lucia Gaskins. Dr. Marin Comment prescribed #45 of oxycodone 10 mg, and Zofran #20.  Phone note from surgery four days ago, decrease Norco pain medication to 5/325 and a prescription was written.   1. Pt complains of abdominal pain, see above.  Plan to continue Protonix 40 mg twice a day and continue Carafate until f/o with surgery. The abdominal pain has been the same since being discharged from the hospital.  She is currently taking one 10 mg dose of Oxycodone w/o Tylenol every 5 hours. She gaining weight and is eating well. Pt is having normal BM. She reports Hydrocodone causes burning back and abdominal pain, so had to change back to oxycodone. She would like to decrease her oxycodone dosage so that she can return to work, but still in pain.  Denies hx of pain medication addiction. She eats with taking the pain medication. She is eating multiple meals a day and trying to eat more protein. Her weight was 111 lb on 5/9 when seen by Dr. Marin Comment and is 112 lb today.  Denies fever.   2. Pt has anemia. Pt's last hemoglobin level was 9.2 taken 05/13/2013. She was seen yesterday by her gynecologist due to heavy menses. Planned on pelvic ultrasound. The endoscopy was repeated on 5/4 and showed a marginal ulcer at the [place] anastomosis however on the endoscopy did not look like it recently bled. Friable mucosa without ulcer. She had a revision of the GJ upper and lower endoscopy 3/31.  Pt states that she feels tired.   3. Pt has ADD. - taken adderall in past - 30mg  BID, prescribed in hospital, and prior by Dr. Ouida Sills.    Pt is  going to follow up with Dr. Lucia Gaskins in four days. She has head CT scheduled for next week, ordered by Dr. Ouida Sills for confusion. The order was placed prior to being re-admitted to the hospital. The confusion has gotten better but it has not resolved. She is forgetful about certain dates and times.  Pt feels that she is unorganized. Her last ADD prescription was administered when she was in the hospital. She does not feel that the medication affects her appetite. Pt was given Zoloft for moderate anxiety and slight depression in the past. She has a hx of anxiety attacks since childhood.   Patient Active Problem List   Diagnosis Date Noted    Symptomatic anemia 05/12/2013   Tobacco abuse 04/05/2013   Cachexia 04/05/2013   S/P gastric bypass 2007 04/04/2013   Gastrojejunal ulcer s/p Lap gastric bypass 04/04/2013   Protein-calorie malnutrition, severe 04/01/2013   Vomiting 03/30/2013   Facial cellulitis 03/21/2012   Anxiety disorder    Past Medical History  Diagnosis Date   ADD (attention deficit disorder)    Anemia    Anxiety disorder     panic   Anxiety    MRSA (methicillin resistant Staphylococcus aureus)    Pneumonia     [place] as child   Blood transfusion without reported diagnosis 05/2013    d/t bleeding ulcer   Depression    Endometriosis    Fibroid    Bleeding ulcer 05-12-13    at GE junction from anastomosis site from gastric bypass--repair by Dr. Keturah Barre.[facility]   Past Surgical History  Procedure Laterality Date   Laparoscopic gastric bypass  2007    [place], [place].  Dr Timmothy Sours   Breast enhancement surgery     Esophagogastroduodenoscopy N/A 03/31/2013    Procedure: ESOPHAGOGASTRODUODENOSCOPY (EGD);  Surgeon: Shann Medal, MD;  Location: [facility];  Service: General;  Laterality: N/A;   Laparoscopic revision of gastrojejunostomy N/A 04/08/2013    Procedure: LAPAROSCOPIC REVISION OF GASTROJEJUNOSTOMY AND UPPER ENDOSCOPY;  Surgeon: Shann Medal, MD;  Location: [facility];  Service: General;  Laterality: N/A;   Esophagogastroduodenoscopy N/A 05/12/2013    Procedure: ESOPHAGOGASTRODUODENOSCOPY (EGD);  Surgeon: Shann Medal, MD;  Location: [place];  Service: General;  Laterality: N/A;   Cesarean section  2005   Augmentation mammaplasty  2008   Abdominal surgery  2008    Tummy tuck   Allergies  Allergen Reactions   Nsaids     Anemia, and Gastric bypass   Promethazine Hcl Other (See Comments)    Agitated, restless   Tramadol Rash   Prior to Admission medications   Medication Sig Start Date End Date Taking? Authorizing Provider  amphetamine-dextroamphetamine (ADDERALL) 30  MG tablet Take 1 tablet (30 mg total) by mouth 2 (two) times daily. 12/06/12  Yes [name], MD  ferrous sulfate 325 (65 FE) MG tablet Take 1 tablet (325 mg total) by mouth 2 (two) times daily with a meal. 05/13/13  Yes Langston Masker, MD  Multiple Vitamin (MULTIVITAMIN WITH MINERALS) TABS tablet Take 1 tablet by mouth daily.   Yes Historical Provider, MD  nicotine (NICODERM CQ - DOSED IN MG/24 HOURS) 14 mg/24hr patch Place 14 mg onto the skin daily.   Yes Historical Provider, MD  ondansetron (ZOFRAN) 4 MG tablet Take 1 tablet (4 mg total) by mouth every 6 (six) hours. 05/17/13  Yes Thao P Le, DO  Oxycodone HCl 10 MG TABS 1/2-1 tab PO every 6-8  hours prn for pain 05/17/13  Yes Thao P  Le, DO  pantoprazole (PROTONIX) 40 MG tablet Take 1 tablet (40 mg total) by mouth 2 (two) times daily. 05/13/13  Yes Langston Masker, MD  sertraline (ZOLOFT) 50 MG tablet Take 1 tablet (50 mg total) by mouth daily. 12/06/12  Yes [name], MD  sucralfate (CARAFATE) 1 GM/10ML suspension Take 10 mLs (1 g total) by mouth 4 (four) times daily -  with meals and at bedtime. 05/13/13  Yes Langston Masker, MD  vitamin B-12 (CYANOCOBALAMIN) 250 MCG tablet Take 250 mcg by mouth daily.   Yes Historical Provider, MD   History   Social History   Marital Status: Legally Separated    Spouse Name: N/A    Number of Children: N/A   Years of Education: N/A   Occupational History   Not on file.   Social History Main Topics   Smoking status: Former Smoker -- 1.00 packs/day for 15 years    Types: Cigarettes    Quit date: 03/30/2013   Smokeless tobacco: Never Used     Comment: patient started patches in the hospital 03/30/13   Alcohol Use: No   Drug Use: No   Sexual Activity: Not Currently    Partners: Male    Birth Control/ Protection: None   Other Topics Concern   Not on file   Social History Narrative   No narrative on file     Review of Systems  Constitutional: Positive for appetite change, fatigue and  unexpected weight change.       Weight gain  Gastrointestinal: Positive for abdominal pain. Negative for constipation.  Psychiatric/Behavioral: Positive for confusion, dysphoric mood and decreased concentration. The patient is nervous/anxious.          Objective:   Physical Exam  Nursing note and vitals reviewed. Constitutional: She appears well-developed and well-nourished. No distress.  HENT:  Head: Normocephalic.  Cardiovascular: Normal rate, regular rhythm and normal heart sounds.   Pulmonary/Chest: Effort normal and breath sounds normal. No respiratory distress. She has no wheezes. She has no rales.  Musculoskeletal: Normal range of motion. She exhibits no edema.  Neurological: She is alert.  Skin: Skin is warm and dry. She is not diaphoretic. No erythema.    Filed Vitals:   05/24/13 1503  BP: 110/77  Pulse: 77  Temp: 98.6 F (37 C)  TempSrc: Oral  Resp: 18  Height: 5\' 4"  (1.626 m)  Weight: 112 lb 9.6 oz (51.075 kg)  SpO2: 98%      DIAGNOSTIC STUDIES: Oxygen Saturation is 98% on room air, normal by my interpretation.    COORDINATION OF CARE:  4:43 PM Discussed course of care with pt . Pt understands and agrees.  Results for orders placed in visit on 05/24/13  POCT CBC      Result Value Ref Range   WBC 6.2  4.6 - 10.2 K/uL   Lymph, poc 1.3  0.6 - 3.4   POC LYMPH PERCENT 21.1  10 - 50 %L   MID (cbc) 0.4  0 - 0.9   POC MID % 7.2  0 - 12 %M   POC Granulocyte 4.4  2 - 6.9   Granulocyte percent 71.7  37 - 80 %G   RBC 4.17  4.04 - 5.48 M/uL   Hemoglobin 10.3 (*) 12.2 - 16.2 g/dL   HCT, POC 33.7 (*) 37.7 - 47.9 %   MCV 80.8  80 - 97 fL   MCH, POC 24.7 (*) 27 - 31.2 pg   MCHC 30.6 (*) 31.8 -  35.4 g/dL   RDW, POC 19.7     Platelet Count, POC 510 (*) 142 - 424 K/uL   MPV 6.8  0 - 99.8 fL   Controlled substance database reviewed and some concerns on this discussed with patient, including multiple listings for suboxone from provider in [place].  Suboxone noted  in Jan to Feb 2015, and last rx #16 on 04/29/13. Patient states that her mother probably picked that up and "got rid of it".  Denies taking this, denies addiction to narcotic medications in the past, or being treated for narcoitc addiction, but this may have been used for pain.  She declined taking this med recently and unable to expplain why multiple fills of this medicine as above if she was not taking it. Again stated her mother may have picked thi up for her.  #45 of oxycodone 10mg  Rx filled 05/17/13 - took last one today. Stated she took them up to every 5 hours.  discussed that this would also be too soon to run out as this frequency would last 9 days.    Assessment & Plan:   Katherine [name] is a 43 y.o. female Abdominal pain, epigastric - Plan: Oxycodone HCl 10 MG TABS  - recent complicated hx Gastrojejunal ulcer s/p lap gastric bypass 2007 and complete outlet obstruction s/p revision of gastrojejunostomy on 04/08/13. reassurring CBC, and now 6 weeks post revision.  Concerns discussed for persistent need for oxycodone, and not typical side of effect of hydrocodone to cause burning in back and abdomen. Additionally concerns discussed from results on controlled substance database, including suboxone that she denies taking, and not initially disclosed with discussion of meds for pain, and running out of medication early.  Will plan to discuss her care on Monday with Dr. Tamsen Roers in [facility] who has prescribed the suboxone. Until seen by surgeon and while gathering more information, refilled oxycodone 10mg  every 6 hours - #20 - enough until seen by general surgeon in 4 days, with Q6hr dosing reviewed.  She is to discuss need for pain medicine with surgeon.  I discussed my concerns with CSRS listings and that suboxone was not initially disclosed.  Until further information known regarding these meds deferred refill of adderall - records to be obtained.   -phone call from pharmacy re: early fill of med from  # 45 oxycodone on the 9th.  Discussed plan above.  # 20 only rx today and no refills at this point. She may need to return to Dr. Tamsen Roers if she was providing pain management.   Anemia - Plan: POCT CBC  --stable/improved. Continue iron.   ADD (attention deficit disorder)   - as above.  Concerns on CSRS, so deferred referring aderrall at this time. May need to have psychiatry follow up for this as prior depression/anxiety history.    Meds ordered this encounter  Medications   Oxycodone HCl 10 MG TABS    Sig: Take 1 tablet (10 mg total) by mouth every 6 (six) hours as needed.    Dispense:  20 tablet    Refill:  0   Patient Instructions  Keep appointment with surgeon in 4 days - you will need to discuss pain medications at that visit.  Until I can discuss your other medicines with prior provider, I will write you for enough of the oxycodone until the eval in 4 days only, but will not be able to provide long term narcotics. We can discuss the Adderall further once review some  of your records and discuss your medication with prior provider.   Return to the clinic or go to the nearest emergency room if any of your symptoms worsen or new symptoms occur.   I personally performed the services described in this documentation, which was scribed in my presence. The recorded information has been reviewed and considered, and addended by me as needed.

## 2013-05-24 NOTE — Patient Instructions (Signed)
Keep appointment with surgeon in 4 days - you will need to discuss pain medications at that visit.  Until I can discuss your other medicines with prior provider, I will write you for enough of the oxycodone until the eval in 4 days only, but will not be able to provide long term narcotics. We can discuss the Adderall further once review some of your records and discuss your medication with prior provider.   Return to the clinic or go to the nearest emergency room if any of your symptoms worsen or new symptoms occur.

## 2013-05-26 ENCOUNTER — Telehealth: Payer: Self-pay | Admitting: Family Medicine

## 2013-05-26 ENCOUNTER — Telehealth: Payer: Self-pay

## 2013-05-26 ENCOUNTER — Other Ambulatory Visit: Payer: PRIVATE HEALTH INSURANCE

## 2013-05-26 NOTE — Telephone Encounter (Signed)
Called patient and left her a message regarding the records request she filled out at our office. She is requesting that another office, the office of Montez Hageman, send [place] her records. I called patient to ask which practice this doctor is with because I am unable to fax this over without any identifying information. Placed records request in "pending" folder for now until we hear back from her. Thanks!

## 2013-05-26 NOTE — Telephone Encounter (Signed)
Called [facility] [place] to verify last suboxone Rx as Ms. Formoso denies remembering filling this herself.  Verified that "Magnus Sinning" signed for rx and paperwork along with DL# of person picking up Rx is being faxed by pharmacy.   Attempted call to Dr. Tamsen Roers [phone] to discuss patient and if she was managing her pain as she is prescriber of Suboxone.  Voicemail left as office not open, will try to reach again in next 2 days.

## 2013-05-27 LAB — IPS PAP TEST WITH HPV

## 2013-05-28 ENCOUNTER — Ambulatory Visit (INDEPENDENT_AMBULATORY_CARE_PROVIDER_SITE_OTHER): Payer: PRIVATE HEALTH INSURANCE | Admitting: Surgery

## 2013-05-28 VITALS — BP 138/82 | HR 77 | Temp 98.6°F | Resp 18 | Ht 64.0 in | Wt 111.8 lb

## 2013-05-28 DIAGNOSIS — K289 Gastrojejunal ulcer, unspecified as acute or chronic, without hemorrhage or perforation: Secondary | ICD-10-CM

## 2013-05-28 NOTE — Progress Notes (Signed)
[place], MD,  [address]  [place], [place]    [address] Phone:  [phone] FAX:  [phone]   Re:   Katherine [name] DOB:   04/25/1970 MRN:   [id number]  ASSESSMENT AND PLAN: 1. Gastric outlet obstruction secondary to ulcer disease and an eroded peri-anastomotic ring -  LAPAROSCOPIC REVISION OF GASTROJEJUNOSTOMY AND UPPER ENDOSCOPY - 04/08/2013 - D. Lucia Gaskins   I gave her Percocet (5/325/), #20.  I will try to make this her last prescription for narcotics.  I wrote her a note to return to work 06/03/2013.  She'll see me back in 3 months.  I will need to decide whether to re-endoscope her or not.  1A.  Marginal ulcer - on Carafate and Protonix.  I did an upper endo on 05/12/2013.  If she cannot get these renewed by her PCP, we would be happy to renew them.   2. 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 Timmothy Sours in [place] (was with [facility]), but she said that he has moved.   She has not seen him in at least 5 years.  3. ADD - since age 64   She's off her Adderall right now.  She is not real clear about who is managing this. 4. Anxiety disorder  5. Hgb - 10.3 - on 05/24/2013   We have finally made head way in to improving her Hgb 6. Smokes cigarettes - approx 1 ppd   Has stopped since admission to hospital. She knows how important to quit smoking permanently.  HISTORY OF PRESENT ILLNESS: Chief Complaint  Patient presents with  . Follow-up    CBC done-    Katherine [name] is a 43 y.o. (DOB: 05-17-1970)  white  female who is a patient of Roselee Culver, MD and comes to me today for follow up of revision of the [place] of a RYGB. She comes by herself.  She looks better.  Has gained weight.  Has less pain. She complains of being gassy.  She now likes cream of wheat since her surgery.  She talked some aobut the physicians that she sees at [facility].  She apparently does well with Dr. Ouida Sills, but she struggles with some  of the others.  Past Medical History  Diagnosis Date  . ADD (attention deficit disorder)   . Anemia   . Anxiety disorder     panic  . Anxiety   . MRSA (methicillin resistant Staphylococcus aureus)   . Pneumonia     [place] as child  . Blood transfusion without reported diagnosis 05/2013    d/t bleeding ulcer  . Depression   . Endometriosis   . Fibroid   . Bleeding ulcer 05-12-13    at GE junction from anastomosis site from gastric bypass--repair by Dr. Keturah Barre.[facility]    SOCIAL HISTORY: She is living with her mother (who is a retired Marine scientist). She was managing a heating and air company, Paediatric nurse, in [place].  PHYSICAL EXAM: BP 138/82  Pulse 77  Temp(Src) 98.6 F (37 C) (Temporal)  Resp 18  Ht 5\' 4"  (1.626 m)  Wt 111 lb 12.8 oz (50.712 kg)  BMI 19.18 kg/m2  LMP 05/11/2013  General: Thin white who is alert and generally healthy appearing.  HEENT: Normal. Pupils equal.  Lungs: Clear to auscultation and symmetric breath sounds. Heart:  RRR. No murmur or rub. Abdomen: Soft. Tender in right abdomen.  BS normal.  No mass.  Extremities:  She showed me where she got SQ Heparin in her anterior thighs, she has small knots.  DATA REVIEWED: Epic notes  Alphonsa Overall, MD,  [facility], [address]  [place], [place]    [place] Phone:  [phone] FAX:  [phone]

## 2013-05-29 NOTE — Telephone Encounter (Signed)
Per patient's office notes, she was seen by this doctor in [facility]. Called the phone number listed online for the [facility] location and was told by Nira Conn at [facility] that this doctor left many years ago and not sure where she is practicing now. Will try to call patient tomorrow to see if we can get more clarification.

## 2013-06-03 ENCOUNTER — Telehealth (INDEPENDENT_AMBULATORY_CARE_PROVIDER_SITE_OTHER): Payer: Self-pay

## 2013-06-03 NOTE — Telephone Encounter (Addendum)
Left message for patient to call back to let us know which office this doctor is affiliated with so we can send request for records. Records request is in pending folder on medical records desk.

## 2013-06-03 NOTE — Telephone Encounter (Signed)
Pt calling to see if Dr Lucia Gaskins would take her out of work until June 1st. Pt states that she has had to have a blood transfusion and has been very weak. Pt would also like to see if she can get her Norco 5/325mg  refilled. Pt states that she is still having pain. Informed pt that Holley Raring said she would look into writing her a work note and we will send Dr Lucia Gaskins a message about getting her pain meds refilled. Pt verbalized understanding and agrees with POC.

## 2013-06-03 NOTE — Telephone Encounter (Signed)
V/M I have not been able to speak to Dr. Lucia Gaskins concerning request for note to be out of work until 06-09-13 and refill of pain medication he is not in the office . I will call her after I speak to DR. [employer]

## 2013-06-06 ENCOUNTER — Encounter (INDEPENDENT_AMBULATORY_CARE_PROVIDER_SITE_OTHER): Payer: Self-pay

## 2013-06-06 ENCOUNTER — Other Ambulatory Visit (INDEPENDENT_AMBULATORY_CARE_PROVIDER_SITE_OTHER): Payer: Self-pay

## 2013-06-06 DIAGNOSIS — L7682 Other postprocedural complications of skin and subcutaneous tissue: Secondary | ICD-10-CM

## 2013-06-06 MED ORDER — OXYCODONE HCL 5 MG PO TABS: 5.0000 mg | ORAL_TABLET | ORAL | Status: DC | PRN

## 2013-06-06 NOTE — Telephone Encounter (Signed)
DR. Lucia Gaskins advised it is ok for one more refill of oxycodone 5mg  # 40 , this will be the last refill for her. It is ok for patient to return to work on 06-10-13. Both oxy and note will be at front desk for pick up

## 2013-06-12 ENCOUNTER — Encounter: Payer: Self-pay | Admitting: Emergency Medicine

## 2013-06-16 ENCOUNTER — Telehealth (INDEPENDENT_AMBULATORY_CARE_PROVIDER_SITE_OTHER): Payer: Self-pay

## 2013-06-16 NOTE — Telephone Encounter (Signed)
F/U call ;mother called ,she is concerned about her daughter. Mother states patient is staying in bed not eating much. Patient job position was give to another employee she was offered part time position , patient feels employer is trying to get her to quit per mother . Advised mother to call DR. Anderson for further advise patient may need her antidepressants altered. Mother verbalized understanding

## 2013-06-18 ENCOUNTER — Ambulatory Visit
Admission: RE | Admit: 2013-06-18 | Discharge: 2013-06-18 | Disposition: A | Discharge status: Home / Self Care | Payer: PRIVATE HEALTH INSURANCE | Source: Ambulatory Visit | Attending: Emergency Medicine | Admitting: Emergency Medicine

## 2013-06-18 DIAGNOSIS — R4182 Altered mental status, unspecified: Secondary | ICD-10-CM

## 2013-08-12 ENCOUNTER — Encounter (INDEPENDENT_AMBULATORY_CARE_PROVIDER_SITE_OTHER): Payer: Self-pay | Admitting: Surgery

## 2013-10-24 ENCOUNTER — Other Ambulatory Visit: Payer: Self-pay

## 2015-08-05 IMAGING — CT CT ABD-PELV W/ CM
2 of 5 series · 16 of 46 positions shown, 18 images · IV contrast (omnipaque)
Comparison: 09/17/2012

CLINICAL DATA: Abdominal pain.

EXAM:
CT ABDOMEN AND PELVIS WITH CONTRAST
TECHNIQUE: Multidetector CT imaging of the abdomen and pelvis was performed
using the standard protocol following bolus administration of
intravenous contrast.
CONTRAST:  50mL OMNIPAQUE IOHEXOL 300 MG/ML SOLN, 100mL OMNIPAQUE
IOHEXOL 300 MG/ML SOLN

[Series 2: abd/pelvis 5.0 b31f · axial · 0.63mm/px · z∈[-460,-65]mm · 13 of 89 slices shown, 15 images]
[im 5/89  soft-tissue]
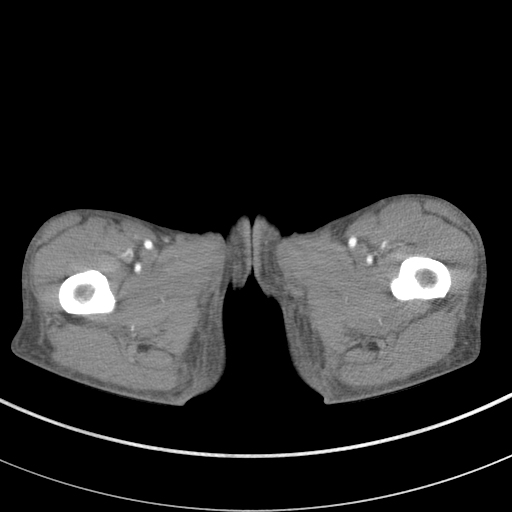
[im 5/89  bone]
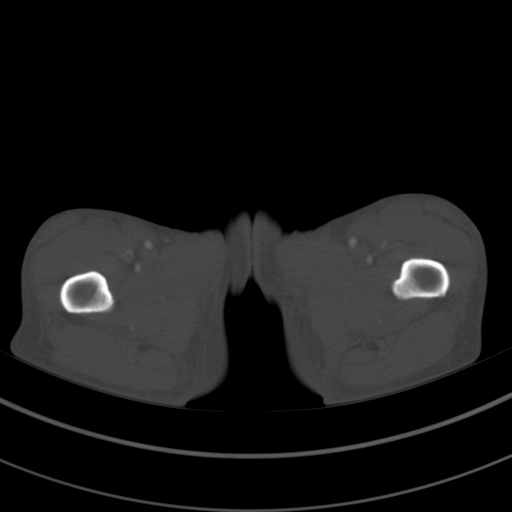
[im 10/89  soft-tissue]
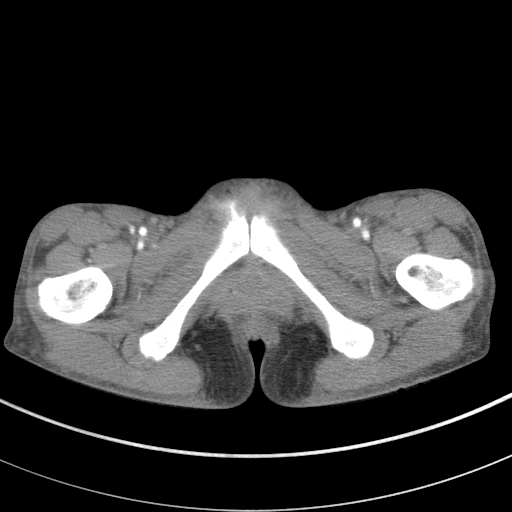
[im 20/89  soft-tissue]
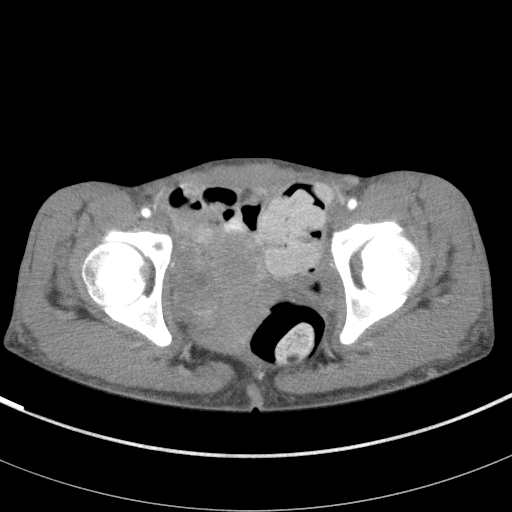
[im 25/89  soft-tissue]
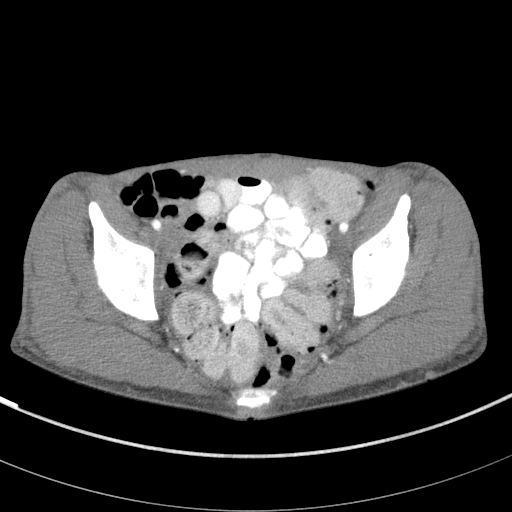
[im 30/89  soft-tissue]
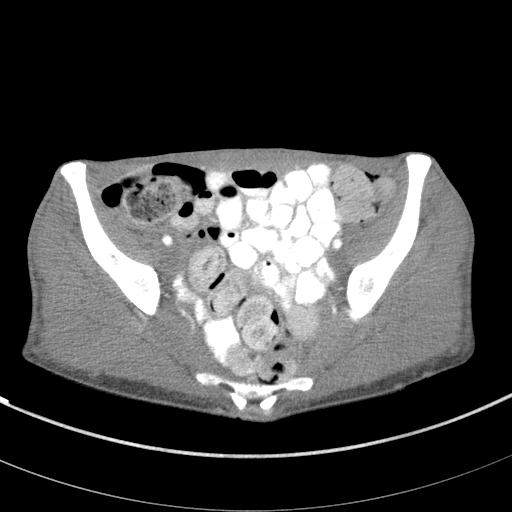
[im 40/89  soft-tissue]
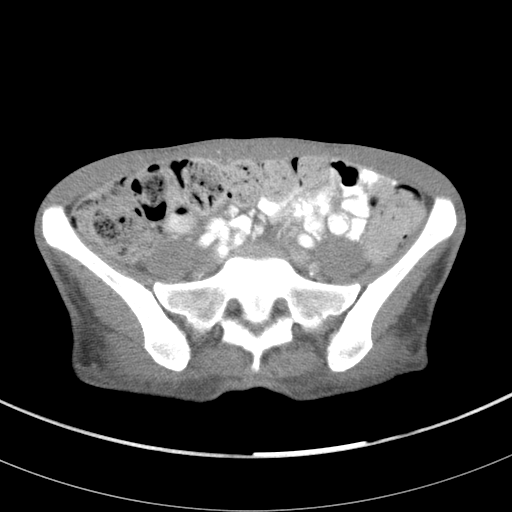
[im 45/89  soft-tissue]
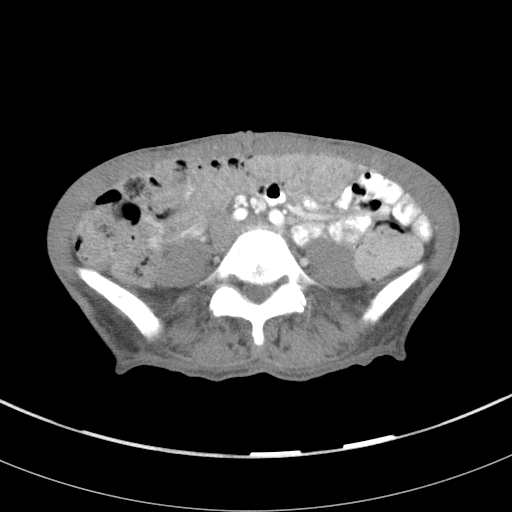
[im 49/89  soft-tissue]
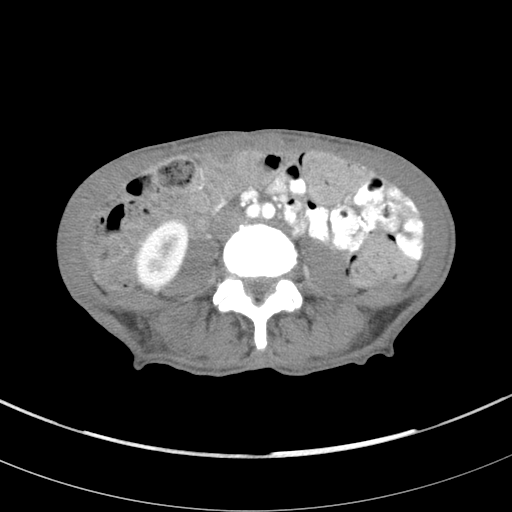
[im 59/89  soft-tissue]
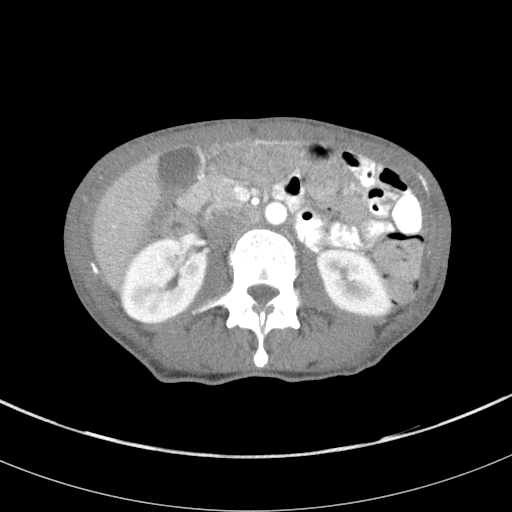
[im 59/89  bone]
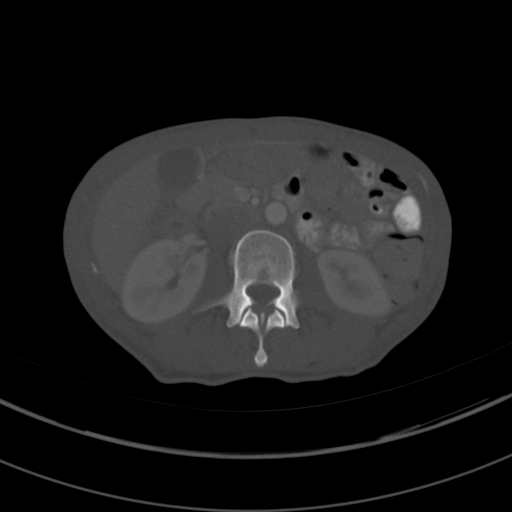
[im 64/89  soft-tissue]
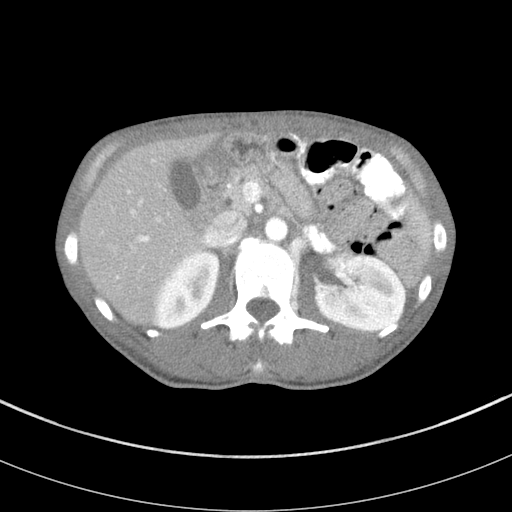
[im 69/89  soft-tissue]
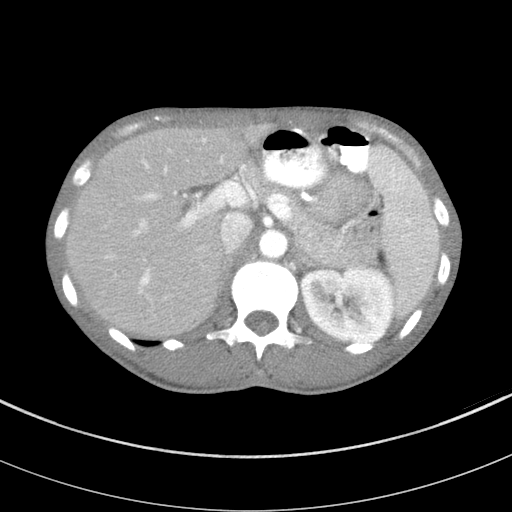
[im 79/89  soft-tissue]
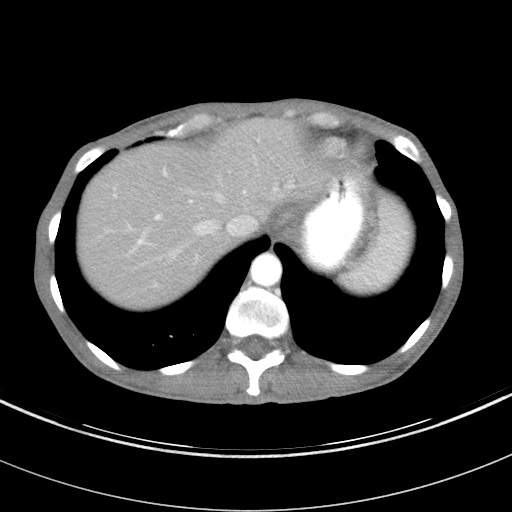
[im 84/89  soft-tissue]
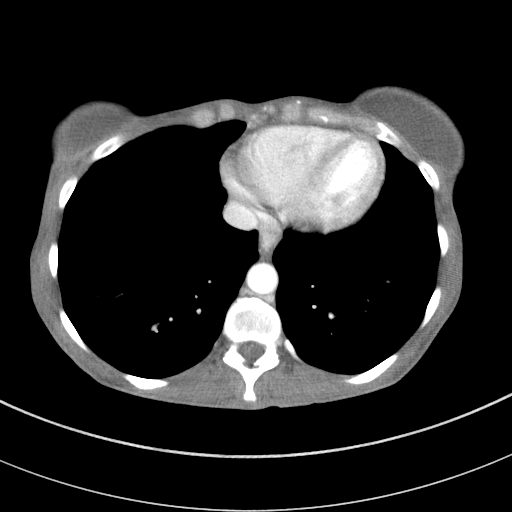

[Series 5: abd/pelvis 3.0 coronal · coronal · 0.67mm/px · 3 of 63 slices shown]
[im 21/63  soft-tissue]
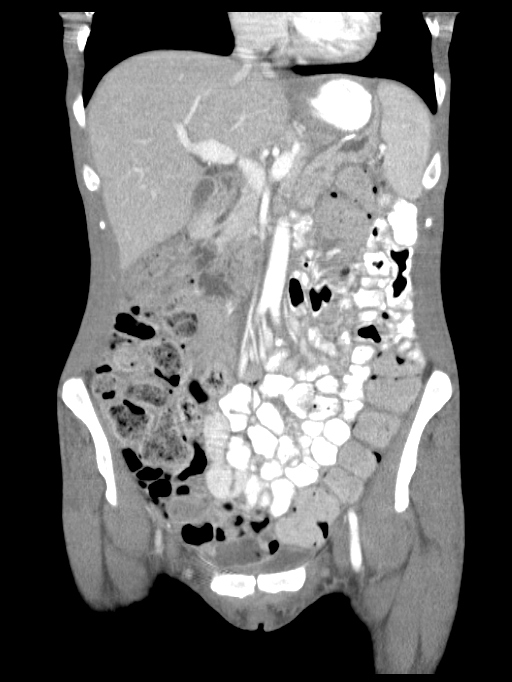
[im 28/63  soft-tissue]
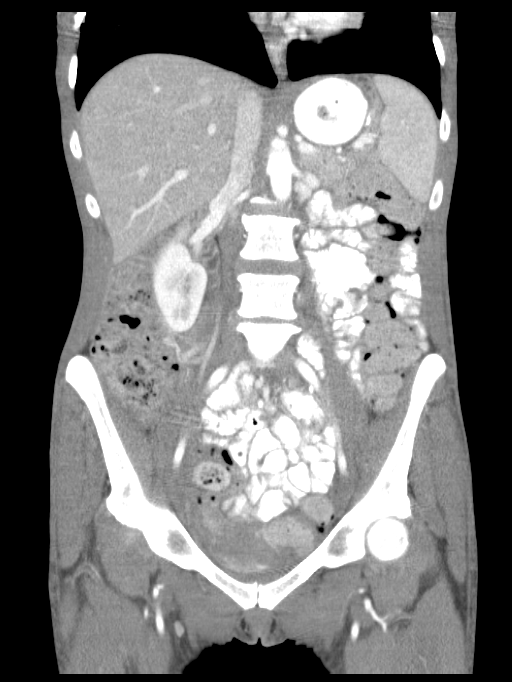
[im 35/63  soft-tissue]
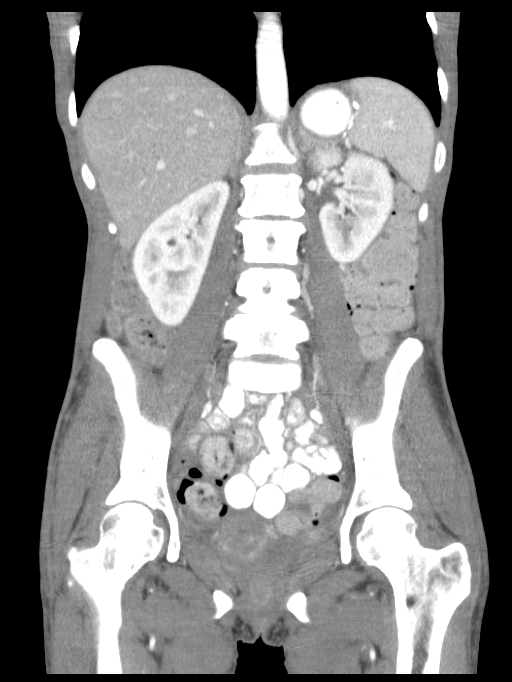

[16 of 46 positions shown; findings below may reference images not displayed]

FINDINGS: BODY WALL: Bilateral breast implants, partly imaged.

LOWER CHEST: Unremarkable.

ABDOMEN/PELVIS:

Liver: No focal abnormality.

Biliary: No evidence of biliary obstruction or stone.

Pancreas: Unremarkable.

Spleen: Unremarkable.

Adrenals: Unremarkable.

Kidneys and ureters: No hydronephrosis or stone.

Bladder: Limited assessment due to decompressed state.

Reproductive: Unremarkable.

Bowel: Large volume of formed stool, throughout the colon, with
distention. There is a Roux-en-Y gastric bypass. No evidence of
gastrogastric fistula, obstruction, or internal hernia. Limited
intra-abdominal fat limits visualization of the appendix. There is
no evidence of right lower quadrant inflammation.

Retroperitoneum: No mass or adenopathy.

Peritoneum: No free fluid or gas.

Vascular: No acute abnormality.

OSSEOUS: Degenerative disc disease with moderate broad disc bulging
at L4-5.
IMPRESSION: 1. Constipation.
2. The appendix is not visualized, but there is no evidence of
pericecal inflammation.
3. Gastric bypass.  No bowel obstruction.

## 2015-10-15 ENCOUNTER — Encounter (HOSPITAL_COMMUNITY): Payer: Self-pay

## 2016-11-06 ENCOUNTER — Encounter (HOSPITAL_COMMUNITY): Payer: Self-pay

## 2019-01-10 HISTORY — PX: MULTIPLE TOOTH EXTRACTIONS: SHX2053

## 2019-02-25 ENCOUNTER — Ambulatory Visit: Payer: PRIVATE HEALTH INSURANCE | Attending: Internal Medicine

## 2020-01-10 DIAGNOSIS — N900 Mild vulvar dysplasia: Secondary | ICD-10-CM

## 2020-01-10 DIAGNOSIS — N904 Leukoplakia of vulva: Secondary | ICD-10-CM

## 2020-01-10 HISTORY — DX: Leukoplakia of vulva: N90.4

## 2020-01-10 HISTORY — DX: Mild vulvar dysplasia: N90.0

## 2020-05-09 DIAGNOSIS — R7989 Other specified abnormal findings of blood chemistry: Secondary | ICD-10-CM

## 2020-05-09 DIAGNOSIS — R87619 Unspecified abnormal cytological findings in specimens from cervix uteri: Secondary | ICD-10-CM

## 2020-05-09 HISTORY — DX: Unspecified abnormal cytological findings in specimens from cervix uteri: R87.619

## 2020-05-09 HISTORY — DX: Other specified abnormal findings of blood chemistry: R79.89

## 2020-05-24 ENCOUNTER — Telehealth: Payer: Self-pay | Admitting: Obstetrics and Gynecology

## 2020-05-24 NOTE — Telephone Encounter (Signed)
After hours phone call.   Patient calling with menstrual bleeding after not having a cycle for 2 years.  She is changing a pad 5 times a day.  She states a history of anemia and iron infusions, last done about a year ago.  Her office visit with me was 05/23/2013.   I recommended she call the office tomorrow to make an appointment.  I recommended she go to the ER if she feels fatigued and not well.

## 2020-05-25 NOTE — Telephone Encounter (Signed)
Patient scheduled on 05/26/20

## 2020-05-26 ENCOUNTER — Ambulatory Visit: Payer: PRIVATE HEALTH INSURANCE | Admitting: Obstetrics and Gynecology

## 2020-05-26 NOTE — Progress Notes (Signed)
GYNECOLOGY  VISIT   HPI: 50 y.o.   Legally Separated  Caucasian  female   [phone] with Patient's last menstrual period was 05/11/2013.   here for postmenopausal bleeding.  Patient is a new returning patient.   No menses in over 2.5 years until she began bleeding again on 05-22-20.  Had some clotting this am.  Having some right sided back pain.   No hot flashes.  Patient seeing oncology with [facility] and getting iron transfusions. Last transfusion was one year ago. Hgb 13.1 on 08/25/19.  Sexually active with her husband in January.  Digital penetration only.   GYNECOLOGIC HISTORY: Patient's last menstrual period was 05/11/2013. Contraception:  PMP Menopausal hormone therapy:  none Last mammogram: 10-12 years ago/normal per patient Last pap smear:  05-23-13 Neg:Neg HR HPV, 2012 Normal per patient--no abnormal paps        OB History    Gravida  3   Para  1   Term      Preterm      AB  2   Living  1     SAB  1   IAB      Ectopic  1   Multiple      Live Births                 Patient Active Problem List   Diagnosis Date Noted  . Symptomatic anemia 05/12/2013  . Tobacco abuse 04/05/2013  . Cachexia ([place]) 04/05/2013  . S/P gastric bypass 2007 04/04/2013  . 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 Protein-calorie malnutrition, severe ([place]) 04/01/2013  . Vomiting 03/30/2013  . Facial cellulitis 03/21/2012  . Anxiety disorder     Past Medical History:  Diagnosis Date  . ADD (attention deficit disorder)   . Anemia    sees Oncology at [facility]--having iron infusions  . Anxiety   . Anxiety disorder    panic  . Bleeding ulcer 05-12-13   at GE junction from anastomosis site from gastric bypass--repair by Dr. Keturah Barre.[place]  . Blood transfusion without reported diagnosis 05/2013   d/t bleeding ulcer  . Blood transfusion without reported diagnosis 05/2019   anemia  . Depression   . Endometriosis   . Fibroid   . MRSA (methicillin resistant  Staphylococcus aureus)   . Pneumonia    [place] as child    Past Surgical History:  Procedure Laterality Date  . ABDOMINAL SURGERY  2008   Tummy tuck  . AUGMENTATION MAMMAPLASTY  2008  . BREAST ENHANCEMENT SURGERY    . CESAREAN SECTION  2005  . DILATION AND CURETTAGE OF UTERUS    . ESOPHAGOGASTRODUODENOSCOPY N/A 03/31/2013   Procedure: ESOPHAGOGASTRODUODENOSCOPY (EGD);  Surgeon: Shann Medal, MD;  Location: [facility];  Service: General;  Laterality: N/A;  . ESOPHAGOGASTRODUODENOSCOPY N/A 05/12/2013   Procedure: ESOPHAGOGASTRODUODENOSCOPY (EGD);  Surgeon: Shann Medal, MD;  Location: [facility] ENDOSCOPY;  Service: General;  Laterality: N/A;  . LAPAROSCOPIC GASTRIC BYPASS  [place], [place].  Dr Timmothy Sours  . LAPAROSCOPIC REVISION OF GASTROJEJUNOSTOMY N/A 04/08/2013   Procedure: LAPAROSCOPIC REVISION OF GASTROJEJUNOSTOMY AND UPPER ENDOSCOPY;  Surgeon: Shann Medal, MD;  Location: [facility];  Service: General;  Laterality: N/A;  . MULTIPLE TOOTH EXTRACTIONS  2021   all teeth removed due to malnutrition    Current Outpatient Medications  Medication Sig Dispense Refill  . ALPRAZolam (XANAX) 0.5 MG tablet Take by mouth.    . cyanocobalamin (,VITAMIN B-12,) 1000 MCG/ML injection Inject into the  muscle.    . Multiple Vitamin (MULTIVITAMIN WITH MINERALS) TABS tablet Take 1 tablet by mouth daily.    Marland Kitchen omeprazole (PRILOSEC) 40 MG capsule      No current facility-administered medications for this visit.     ALLERGIES: Nsaids, Other, Rabeprazole sodium, Tramadol, and Promethazine hcl  Family History  Problem Relation Age of Onset  . Other Other   . Other Other   . Breast cancer Maternal Grandmother 81  . Hypertension Mother   . Stroke Paternal Grandmother   . Hyperlipidemia Father   . Hyperlipidemia Sister   . Hyperlipidemia Paternal Grandfather     Social History   Socioeconomic History  . Marital status: Legally Separated    Spouse name: Not on file  . Number of children: Not on  file  . Years of education: Not on file  . Highest education level: Not on file  Occupational History  . Not on file  Tobacco Use  . Smoking status: Current Every Day Smoker    Packs/day: 0.50    Years: 15.00    Pack years: 7.50    Types: Cigarettes    Last attempt to quit: 03/30/2013    Years since quitting: 7.1  . Smokeless tobacco: Never Used  . Tobacco comment: patient started patches in the hospital 03/30/13  Vaping Use  . Vaping Use: Never used  Substance and Sexual Activity  . Alcohol use: No  . Drug use: No  . Sexual activity: Not Currently    Partners: Male    Birth control/protection: None  Other Topics Concern  . Not on file  Social History Narrative  . Not on file   Social Determinants of Health   Financial Resource Strain: Not on file  Food Insecurity: Not on file  Transportation Needs: Not on file  Physical Activity: Not on file  Stress: Not on file  Social Connections: Not on file  Intimate Partner Violence: Not on file    Review of Systems  See HPI.  PHYSICAL EXAMINATION:    BP 100/64   Pulse (!) 104   Ht 5' 3.5" (1.613 m)   Wt 131 lb (59.4 kg)   LMP 05/11/2013   SpO2 98%   BMI 22.84 kg/m     General appearance: alert, cooperative and appears stated age Head: Normocephalic, without obvious abnormality, atraumatic Neck: no adenopathy, supple, symmetrical, trachea midline and thyroid normal to inspection and palpation Lungs: clear to auscultation bilaterally Heart: regular rate and rhythm Abdomen: soft, non-tender, no masses,  no organomegaly Extremities: extremities normal, atraumatic, no cyanosis or edema Lymph nodes: Cervical, supraclavicular, and inguinal nodes normal. Neurologic: Grossly normal  Pelvic: External genitalia:  no lesions              Urethra:  normal appearing urethra with no masses, tenderness or lesions              Bartholins and Skenes: normal                 Vagina: normal appearing vagina with normal color and  discharge, no lesions              Cervix: no lesions.  Mucousy blood noted. Light in quantity.                 Bimanual Exam:  Uterus:  normal size, contour, position, consistency, mobility, mildly tender              Adnexa: no mass, fullness, tenderness  Rectal exam: Yes.  .  Confirms.              Anus:  normal sphincter tone, no lesions  Chaperone was present for exam.  ASSESSMENT  Postmenopausal bleeding. Pelvic pain.  Low suspicion for PID.  Cervical cancer screening. Hx gastric bypass. Hx gastric ulcer.  Hx anemia. STD screening.   PLAN  Pap and HR HPV testing, GC/trich. Will check FSH, estradiol, CBC, iron, and ferritin.  If not menopausal, will check TSH and prolactin also.  We discussed potential causes of postmenopausal bleeding - infection, atrophy, polyps, cancer.  Return for pelvic ultrasound and endometrial biopsy.  Tylenol regular or extra strength and heat to treat pain.

## 2020-05-27 ENCOUNTER — Ambulatory Visit: Payer: Medicaid Other | Admitting: Obstetrics and Gynecology

## 2020-05-27 ENCOUNTER — Encounter: Payer: Self-pay | Admitting: Obstetrics and Gynecology

## 2020-05-27 ENCOUNTER — Other Ambulatory Visit: Payer: Self-pay

## 2020-05-27 ENCOUNTER — Other Ambulatory Visit (HOSPITAL_COMMUNITY)
Admission: RE | Admit: 2020-05-27 | Discharge: 2020-05-27 | Disposition: A | Discharge status: Home / Self Care | Payer: Medicaid Other | Source: Ambulatory Visit | Attending: Obstetrics and Gynecology | Admitting: Obstetrics and Gynecology

## 2020-05-27 VITALS — BP 100/64 | HR 104 | Ht 63.5 in | Wt 131.0 lb

## 2020-05-27 DIAGNOSIS — Z113 Encounter for screening for infections with a predominantly sexual mode of transmission: Secondary | ICD-10-CM | POA: Diagnosis not present

## 2020-05-27 DIAGNOSIS — Z862 Personal history of diseases of the blood and blood-forming organs and certain disorders involving the immune mechanism: Secondary | ICD-10-CM | POA: Diagnosis not present

## 2020-05-27 DIAGNOSIS — Z124 Encounter for screening for malignant neoplasm of cervix: Secondary | ICD-10-CM | POA: Diagnosis not present

## 2020-05-27 DIAGNOSIS — N950 Postmenopausal bleeding: Secondary | ICD-10-CM

## 2020-05-27 DIAGNOSIS — N915 Oligomenorrhea, unspecified: Secondary | ICD-10-CM

## 2020-05-27 NOTE — Patient Instructions (Signed)
Endometrial Biopsy  An endometrial biopsy is a procedure to remove tissue samples from the endometrium, which is the lining of the uterus. The tissue that is removed can then be checked under a microscope for disease. This procedure is used to diagnose conditions such as endometrial cancer, endometrial tuberculosis, polyps, or other inflammatory conditions. This procedure may also be used to investigate uterine bleeding to determine where you are in your menstrual cycle or how your hormone levels are affecting the lining of the uterus. Tell a health care provider about:  Any allergies you have.  All medicines you are taking, including vitamins, herbs, eye drops, creams, and over-the-counter medicines.  Any problems you or family members have had with anesthetic medicines.  Any blood disorders you have.  Any surgeries you have had.  Any medical conditions you have.  Whether you are pregnant or may be pregnant. What are the risks? Generally, this is a safe procedure. However, problems may occur, including:  Bleeding.  Pelvic infection.  Puncture of the wall of the uterus with the biopsy device (rare).  Allergic reactions to medicines. What happens before the procedure?  Keep a record of your menstrual cycles as told by your health care provider. You may need to schedule your procedure for a specific time in your cycle.  You may want to bring a sanitary pad to wear after the procedure.  Plan to have someone take you home from the hospital or clinic.  Ask your health care provider about: ? Changing or stopping your regular medicines. This is especially important if you are taking diabetes medicines, arthritis medicines, or blood thinners. ? Taking medicines such as aspirin and ibuprofen. These medicines can thin your blood. Do not take these medicines unless your health care provider tells you to take them. ? Taking over-the-counter medicines, vitamins, herbs, and  supplements. What happens during the procedure?  You will lie on an exam table with your feet and legs supported as in a pelvic exam.  Your health care provider will insert an instrument (speculum) into your vagina to see your cervix.  Your cervix will be cleansed with an antiseptic solution.  A medicine (local anesthetic) will be used to numb the cervix.  A forceps instrument (tenaculum) will be used to hold your cervix steady for the biopsy.  A thin, rod-like instrument (uterine sound) will be inserted through your cervix to determine the length of your uterus and the location where the biopsy sample will be removed.  A thin, flexible tube (catheter) will be inserted through your cervix and into the uterus. The catheter will be used to collect the biopsy sample from your endometrial tissue.  The catheter and speculum will then be removed, and the tissue sample will be sent to a lab for examination. The procedure may vary among health care providers and hospitals. What can I expect after procedure?  You will rest in a recovery area until you are ready to go home.  You may have mild cramping and a small amount of vaginal bleeding. This is normal.  You may have a small amount of vaginal bleeding for a few days. This is normal.  It is up to you to get the results of your procedure. Ask your health care provider, or the department that is doing the procedure, when your results will be ready. Follow these instructions at home:  Take over-the-counter and prescription medicines only as told by your health care provider.  Do not douche, use tampons, or have   sexual intercourse until your health care provider approves.  Return to your normal activities as told by your health care provider. Ask your health care provider what activities are safe for you.  Follow instructions from your health care provider about any activity restrictions, such as restrictions on strenuous exercise or heavy  lifting.  Keep all follow-up visits. This is important. Contact a health care provider:  You have heavy bleeding, or bleed for longer than 2 days after the procedure.  You have bad smelling discharge from your vagina.  You have a fever or chills.  You have a burning sensation when urinating or you have difficulty urinating.  You have severe pain in your lower abdomen. Get help right away if you:  You have severe cramps in your stomach or back.  You pass large blood clots.  Your bleeding increases.  You become weak or light-headed, or you faint or lose consciousness. Summary  An endometrial biopsy is a procedure to remove tissue samples is taken from the endometrium, which is the lining of the uterus.  The tissue sample that is removed will be checked under a microscope for disease.  This procedure is used to diagnose conditions such as endometrial cancer, endometrial tuberculosis, polyps, or other inflammatory conditions.  After the procedure, it is common to have mild cramping and a small amount of vaginal bleeding for a few days.  Do not douche, use tampons, or have sexual intercourse until your health care provider approves. Ask your health care provider which activities are safe for you. This information is not intended to replace advice given to you by your health care provider. Make sure you discuss any questions you have with your health care provider. Document Revised: 07/21/2019 Document Reviewed: 07/21/2019 Elsevier Patient Education  2021 [place]. Postmenopausal Bleeding Postmenopausal bleeding is any bleeding that a woman has after she has entered menopause. Menopause is the end of a woman's fertile years. After menopause, a woman no longer ovulates and does not have menstrual periods. Therefore, she should no longer have bleeding from her vagina. Postmenopausal bleeding may have various causes, including:  Menopausal hormone therapy (MHT).  Endometrial  atrophy. After menopause, low estrogen hormone levels cause the membrane that lines the uterus (endometrium) to become thin. You may have bleeding as the endometrium thins.  Endometrial hyperplasia. This condition is caused by excess estrogen hormones and low levels of progesterone hormones. The excess estrogen causes the endometrium to thicken, which can lead to bleeding. In some cases, this can lead to cancer of the uterus.  Endometrial cancer.  Noncancerous growths (polyps) on the endometrium, the lining of the uterus, or the cervix.  Uterine fibroids. These are noncancerous growths in or around the uterus muscle tissue that can cause heavy bleeding. Any type of postmenopausal bleeding, even if it appears to be a typical menstrual period, should be checked by your health care provider. Treatment will depend on the cause of the bleeding. Follow these instructions at home:  Pay attention to any changes in your symptoms. Let your health care provider know about them.  Avoid using tampons and douches as told by your health care provider.  Change your pads regularly.  Get regular pelvic exams, including Pap tests, as told by your health care provider.  Take iron supplements as told by your health care provider.  Take over-the-counter and prescription medicines only as told by your health care provider.  Keep all follow-up visits. This is important.   Contact a health care  provider if:  You have new bleeding from the vagina after menopause.  You have pain in your abdomen. Get help right away if:  You have a fever or chills.  You have severe pain with bleeding.  You are passing blood clots.  You have heavy bleeding, need more than 1 pad an hour, and have never experienced this before.  You have headaches or feel faint or dizzy. Summary  Postmenopausal bleeding is any bleeding that a woman has after she has entered into menopause.  Postmenopausal bleeding may have various  causes. Treatment will depend on the cause of the bleeding.  Any type of postmenopausal bleeding, even if it appears to be a typical menstrual period, should be checked by your health care provider.  Be sure to pay attention to any changes in your symptoms and keep all follow-up visits. This information is not intended to replace advice given to you by your health care provider. Make sure you discuss any questions you have with your health care provider. Document Revised: 06/12/2019 Document Reviewed: 06/12/2019 Elsevier Patient Education  [place].

## 2020-05-28 LAB — CBC
MCHC: 33.5 g/dL (ref 32.0–36.0)
MCV: 93.6 fL (ref 80.0–100.0)
MPV: 9.7 fL (ref 7.5–12.5)

## 2020-05-29 LAB — CBC
HCT: 45.1 % — ABNORMAL HIGH (ref 35.0–45.0)
Hemoglobin: 15.1 g/dL (ref 11.7–15.5)
MCH: 31.3 pg (ref 27.0–33.0)
Platelets: 328 10*3/uL (ref 140–400)
RBC: 4.82 10*6/uL (ref 3.80–5.10)
RDW: 12.1 % (ref 11.0–15.0)
WBC: 6.8 10*3/uL (ref 3.8–10.8)

## 2020-05-29 LAB — FERRITIN: Ferritin: 43 ng/mL (ref 16–232)

## 2020-05-29 LAB — IRON: Iron: 67 ug/dL (ref 40–190)

## 2020-05-29 LAB — TSH: TSH: 5.1 mIU/L — ABNORMAL HIGH

## 2020-05-29 LAB — ESTRADIOL: Estradiol: 77 pg/mL

## 2020-05-29 LAB — PROLACTIN: Prolactin: 9.9 ng/mL

## 2020-05-29 LAB — FOLLICLE STIMULATING HORMONE: FSH: 25.9 m[IU]/mL

## 2020-06-01 LAB — CYTOLOGY - PAP
Adequacy: ABSENT
Chlamydia: NEGATIVE
Comment: NEGATIVE
Comment: NEGATIVE
Comment: NEGATIVE
Comment: NORMAL
High risk HPV: NEGATIVE
Neisseria Gonorrhea: NEGATIVE
Trichomonas: NEGATIVE

## 2020-06-03 ENCOUNTER — Encounter: Payer: Self-pay | Admitting: Obstetrics and Gynecology

## 2020-06-27 ENCOUNTER — Telehealth: Payer: PRIVATE HEALTH INSURANCE | Admitting: Family

## 2020-06-27 ENCOUNTER — Encounter: Payer: Self-pay | Admitting: Family

## 2020-06-27 DIAGNOSIS — W57XXXA Bitten or stung by nonvenomous insect and other nonvenomous arthropods, initial encounter: Secondary | ICD-10-CM

## 2020-06-27 DIAGNOSIS — S80862A Insect bite (nonvenomous), left lower leg, initial encounter: Secondary | ICD-10-CM | POA: Diagnosis not present

## 2020-06-27 MED ORDER — DOXYCYCLINE HYCLATE 100 MG PO TABS: 100.0000 mg | ORAL_TABLET | Freq: Two times a day (BID) | ORAL | 0 refills | Status: DC

## 2020-06-27 NOTE — Progress Notes (Signed)
Virtual Visit Consent   Katherine [name], you are scheduled for a virtual visit with a [facility] provider today.     Just as with appointments in the office, your consent must be obtained to participate.  Your consent will be active for this visit and any virtual visit you may have with one of our providers in the next 365 days.     If you have a MyChart account, a copy of this consent can be sent to you electronically.  All virtual visits are billed to your insurance company just like a traditional visit in the office.    As this is a virtual visit, video technology does not allow for your provider to perform a traditional examination.  This may limit your provider's ability to fully assess your condition.  If your provider identifies any concerns that need to be evaluated in person or the need to arrange testing (such as labs, EKG, etc.), we will make arrangements to do so.     Although advances in technology are sophisticated, we cannot ensure that it will always work on either your end or our end.  If the connection with a video visit is poor, the visit may have to be switched to a telephone visit.  With either a video or telephone visit, we are not always able to ensure that we have a secure connection.     I need to obtain your verbal consent now.   Are you willing to proceed with your visit today?    Katherine [name] has provided verbal consent on 06/27/2020 for a virtual visit (video or telephone).   Evelina Dun, FNP   Date: 06/27/2020 1:49 PM   Virtual Visit via Video Note   IEvelina Dun, connected [email or web address] ([id number], Apr 21, 1970) on 06/27/20 at  1:30 PM EDT by a video-enabled telemedicine application and verified that I am speaking with the correct person using two identifiers.  Location: Patient: Virtual Visit Location Patient: Home Provider: Virtual Visit Location Provider: Home   I discussed the limitations of evaluation and management by telemedicine and the  availability of in person appointments. The patient expressed understanding and agreed to proceed.    History of Present Illness: Katherine [name] is a 50 y.o. who identifies as a female who was assigned female at birth, and is being seen today for a insect bite on upper thigh. She reports the noticed the area yesterday after waking up. She states the area is becoming larger, red, painful and warm.   She has tried cold compresses and tylenol.   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   Protein-calorie malnutrition, severe ([place]) 04/01/2013   Vomiting 03/30/2013   Facial cellulitis 03/21/2012   Anxiety disorder     Allergies:  Allergies  Allergen Reactions   Nsaids Other (See Comments)    Anemia, and Gastric bypass   Other Other (See Comments)    Agitated, restless   Rabeprazole Sodium Hives   Tramadol Rash   Promethazine Hcl Other (See Comments)    Agitated, restless   Medications:  Current Outpatient Medications:    doxycycline (VIBRA-TABS) 100 MG tablet, Take 1 tablet (100 mg total) by mouth 2 (two) times daily., Disp: 20 tablet, Rfl: 0   ALPRAZolam (XANAX) 0.5 MG  tablet, Take by mouth., Disp: , Rfl:    cyanocobalamin (,VITAMIN B-12,) 1000 MCG/ML injection, Inject into the muscle., Disp: , Rfl:    Multiple Vitamin (MULTIVITAMIN WITH MINERALS) TABS tablet, Take 1 tablet by mouth daily., Disp: , Rfl:    omeprazole (PRILOSEC) 40 MG capsule, , Disp: , Rfl:   Observations/Objective: Patient is well-developed, well-nourished in no acute distress.  Resting comfortably  at home.  Head is normocephalic, atraumatic.  No labored breathing.  Speech is clear and coherent with logical content.  Patient is alert and oriented at baseline.  Left upper medial thigh approx orange  size erythemas spot. With a small bite mark in center.   Assessment and Plan: 1. Insect bite of left lower leg, initial encounter - doxycycline (VIBRA-TABS) 100 MG tablet; Take 1 tablet (100 mg total) by mouth 2 (two) times daily.  Dispense: 20 tablet; Refill: 0 Start doxycyline today Mark area with sharpie, if redness goes beyond area after 24-48 hours on antibiotics, needs to be seen in person.  Keep elevated when possible Tylenol as needed for pain  Follow Up Instructions: I discussed the assessment and treatment plan with the patient. The patient was provided an opportunity to ask questions and all were answered. The patient agreed with the plan and demonstrated an understanding of the instructions.  A copy of instructions were sent to the patient via MyChart.  The patient was advised to call back or seek an in-person evaluation if the symptoms worsen or if the condition fails to improve as anticipated.  Time:  I spent 22 minutes with the patient via telehealth technology discussing the above problems/concerns.    Evelina Dun, FNP

## 2020-07-07 NOTE — Progress Notes (Signed)
GYNECOLOGY  VISIT   HPI: 50 y.o.   Legally Separated  Caucasian  female   [phone] with Patient's last menstrual period was 05/11/2013.   here for pelvic ultrasound and possible EMB.  No period for over 2.5 years and started bleeding on 05/22/20.  Bleeding continued until 4 - 5 days ago.  Feels like she may ovulate pain.    FSH 25.9 and estradiol 77.  Her TSH was 5.10, elevated. No free T4 could be added.  Prolactin normal.   Last intercourse was 2.5 year ago.   GYNECOLOGIC HISTORY: Patient's last menstrual period was 05/11/2013. 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 Keturah Barre.[place]   Blood transfusion without reported diagnosis 05/2013   d/t bleeding ulcer   Blood transfusion without reported diagnosis 05/2019   anemia   Depression    Elevated TSH 05/2020   needs free  T4 drawn   Endometriosis    Fibroid    MRSA (methicillin resistant Staphylococcus aureus)    Pneumonia    [place] as child    Past Surgical History:  Procedure Laterality Date   ABDOMINAL SURGERY  2008   Tummy tuck   AUGMENTATION MAMMAPLASTY  2008   BREAST ENHANCEMENT SURGERY     CESAREAN SECTION  2005   DILATION AND CURETTAGE OF UTERUS     ESOPHAGOGASTRODUODENOSCOPY N/A 03/31/2013   Procedure: ESOPHAGOGASTRODUODENOSCOPY (EGD);  Surgeon: Shann Medal, MD;  Location: [facility];  Service: General;  Laterality: N/A;   ESOPHAGOGASTRODUODENOSCOPY N/A 05/12/2013   Procedure: ESOPHAGOGASTRODUODENOSCOPY (EGD);  Surgeon: Shann Medal, MD;  Location: [facility] ENDOSCOPY;  Service: General;  Laterality: N/A;   [facility] GASTRIC BYPASS  [place], [place].  Dr Timmothy Sours   LAPAROSCOPIC REVISION OF GASTROJEJUNOSTOMY N/A 04/08/2013   Procedure: LAPAROSCOPIC REVISION OF GASTROJEJUNOSTOMY AND UPPER ENDOSCOPY;  Surgeon: Shann Medal, MD;  Location: [facility] ORS;  Service: General;  Laterality: N/A;   MULTIPLE TOOTH EXTRACTIONS  2021   all teeth removed due to malnutrition    Current Outpatient Medications  Medication  Sig Dispense Refill   ALPRAZolam (XANAX) 0.5 MG tablet Take by mouth.     cyanocobalamin (,VITAMIN B-12,) 1000 MCG/ML injection Inject into the muscle.     Multiple Vitamin (MULTIVITAMIN WITH MINERALS) TABS tablet Take 1 tablet by mouth daily.     omeprazole (PRILOSEC) 40 MG capsule      No current facility-administered medications for this visit.     ALLERGIES: Nsaids, Other, Rabeprazole sodium, Tramadol, and Promethazine hcl  Family History  Problem Relation Age of Onset   Other Other    Other Other    Breast cancer Maternal Grandmother 31   Hypertension Mother    Stroke Paternal Grandmother    Hyperlipidemia Father    Hyperlipidemia Sister    Hyperlipidemia Paternal Grandfather     Social History   Socioeconomic History   Marital status: Legally Separated    Spouse  name: Not on file   Number of children: Not on file   Years of education: Not on file   Highest education level: Not on file  Occupational History   Not on file  Tobacco Use   Smoking status: Every Day    Packs/day: 0.50    Years: 15.00    Pack years: 7.50    Types: Cigarettes    Last attempt to quit: 03/30/2013    Years since quitting: 7.2   Smokeless tobacco: Never   Tobacco comments:    patient started patches in the hospital 03/30/13  Vaping Use   Vaping Use: Never used  Substance and Sexual Activity   Alcohol use: No   Drug use: No   Sexual activity: Not Currently    Partners: Male    Birth control/protection: None  Other Topics Concern   Not on file  Social History Narrative   Not on file   Social Determinants of Health   Financial Resource Strain: Not on file  Food Insecurity: Not on file  Transportation Needs: Not on file  Physical Activity: Not on file  Stress: Not on file  Social Connections: Not on file  Intimate Partner Violence: Not on file    Review of Systems  All other systems reviewed and are negative.  PHYSICAL EXAMINATION:    BP 116/64   Pulse 85   Ht 5' 3.5" (1.613 m)   Wt 131 lb (59.4 kg)   LMP 05/11/2013   SpO2 97%   BMI 22.84 kg/m     General appearance: alert, cooperative and appears stated age   Pelvic: External genitalia:  no lesions              Urethra:  normal appearing urethra with no masses, tenderness or lesions              Bartholins and Skenes: normal                 Vagina: normal appearing vagina with normal color and discharge, no lesions              Cervix: no lesions                Pelvic US Uterus no myometrial masses.  EMS 3.80 mm.  Right ovary with 4 mm follicle and left ovary with 9 mm collapsed CL cyst.  No adnexal masses.  No free fluid.   EMB Consent for procedure.  Hibiclens prep.  Local 1% lidocaine, lot [id number], exp1/2024. Tenaculum to anterior cervical lip.  Pipelle passed x 2 to 7 cm.    Chaperone was  present for exam.  ASSESSMENT  Abnormal perimenopausal bleeding.  Elevated TSH.  Atypical endometrial cells, neg HR HPV.   PLAN  Pelvic ultrasound findings and images reviewed.  Fu EMB.  TFTs.  I reminded her she needs to use contraception if is sexually active and she does not wish to be pregnant. She will update her mammogram.   Addendum - pap was reviewed after her office visit was complete, and it showed atypical endometrial cells and not atypical squamous cells.  She will need a colposcopy with ECC to complete her evaluation.

## 2020-07-08 ENCOUNTER — Other Ambulatory Visit (HOSPITAL_COMMUNITY)
Admission: RE | Admit: 2020-07-08 | Discharge: 2020-07-08 | Disposition: A | Discharge status: Home / Self Care | Payer: Medicaid Other | Source: Ambulatory Visit | Attending: Obstetrics and Gynecology | Admitting: Obstetrics and Gynecology

## 2020-07-08 ENCOUNTER — Ambulatory Visit (INDEPENDENT_AMBULATORY_CARE_PROVIDER_SITE_OTHER): Payer: Medicaid Other

## 2020-07-08 ENCOUNTER — Encounter: Payer: Self-pay | Admitting: Obstetrics and Gynecology

## 2020-07-08 ENCOUNTER — Ambulatory Visit (INDEPENDENT_AMBULATORY_CARE_PROVIDER_SITE_OTHER): Payer: Medicaid Other | Admitting: Obstetrics and Gynecology

## 2020-07-08 ENCOUNTER — Other Ambulatory Visit: Payer: Medicaid Other

## 2020-07-08 ENCOUNTER — Other Ambulatory Visit: Payer: Self-pay

## 2020-07-08 VITALS — BP 116/64 | HR 85 | Ht 63.5 in | Wt 131.0 lb

## 2020-07-08 DIAGNOSIS — N879 Dysplasia of cervix uteri, unspecified: Secondary | ICD-10-CM | POA: Diagnosis not present

## 2020-07-08 DIAGNOSIS — N924 Excessive bleeding in the premenopausal period: Secondary | ICD-10-CM | POA: Insufficient documentation

## 2020-07-08 DIAGNOSIS — R87619 Unspecified abnormal cytological findings in specimens from cervix uteri: Secondary | ICD-10-CM | POA: Diagnosis not present

## 2020-07-08 DIAGNOSIS — R7989 Other specified abnormal findings of blood chemistry: Secondary | ICD-10-CM

## 2020-07-08 DIAGNOSIS — N950 Postmenopausal bleeding: Secondary | ICD-10-CM

## 2020-07-08 NOTE — Patient Instructions (Signed)

## 2020-07-09 LAB — TSH: TSH: 1.98 mIU/L

## 2020-07-09 LAB — T4, FREE: Free T4: 1.1 ng/dL (ref 0.8–1.8)

## 2020-07-14 LAB — SURGICAL PATHOLOGY

## 2020-07-15 ENCOUNTER — Encounter: Payer: Self-pay | Admitting: Obstetrics and Gynecology

## 2020-08-16 ENCOUNTER — Telehealth: Payer: Self-pay

## 2020-08-16 NOTE — Telephone Encounter (Signed)
Opened in error

## 2020-08-27 NOTE — Progress Notes (Signed)
  Subjective:     Patient ID: Katherine [name], female   DOB: Jun 26, 1970, 50 y.o.   MRN: [id number]  HPI Patient here today for colposcopy with pap 05-27-20 Atypical endometrial cells, NOS.   HR HPV negative. Benign EMB.  Last intercourse was January, 2022.  Patient is perimenopausal by her [facility] and estradiol done on 05/27/20.   Review of Systems  All other systems reviewed and are negative. LMP: PMP Contraception: PMP      Objective:   Physical Exam    Colposcopy of the cervix, upper vaginal and vulva.  Consent for colposcopy.  Speculum placed.  3% acetic acid used.  No lesions of the cervix or upper vagina.  Satisfactory colposcopy.  ECC taken and sent to pathology.  Minimal EBL.  No complications.  Colposcopy of the vulva.   Fissure of the right supraclitoral region extending to the right superior labia minora.   Sterile prep of this area with betadine.  Local 1% lidocaine to area.  3 mm punch biopsy taken.  Tissue to pathology.  3/0 vicryl suture placed.  Minimal EBL.  No complications.  Separation of the skin of the perineal body after speculum exam.  Perianal region with fissure formation and erythema of the skin.   Assessment:     Atypical endometrial cells on pap.  Perimenopausal female.  Vulvar fissure.  Vulvitis.     Plan:     Fu biopsies.  I anticipate a pap and HR HPR testing in one year.  Rx for Lotrisone cream to vulvar and perianal region.  (Wait until suture has dissolved for use of cream in this area.) Final plan to follow.

## 2020-08-30 ENCOUNTER — Encounter: Payer: Self-pay | Admitting: Obstetrics and Gynecology

## 2020-08-30 ENCOUNTER — Other Ambulatory Visit: Payer: Self-pay

## 2020-08-30 ENCOUNTER — Other Ambulatory Visit (HOSPITAL_COMMUNITY)
Admission: RE | Admit: 2020-08-30 | Discharge: 2020-08-30 | Disposition: A | Discharge status: Home / Self Care | Payer: Medicaid Other | Source: Ambulatory Visit | Attending: Obstetrics and Gynecology | Admitting: Obstetrics and Gynecology

## 2020-08-30 ENCOUNTER — Ambulatory Visit (INDEPENDENT_AMBULATORY_CARE_PROVIDER_SITE_OTHER): Payer: Medicaid Other | Admitting: Obstetrics and Gynecology

## 2020-08-30 VITALS — BP 120/68 | Ht 63.5 in | Wt 131.0 lb

## 2020-08-30 DIAGNOSIS — L900 Lichen sclerosus et atrophicus: Secondary | ICD-10-CM | POA: Diagnosis not present

## 2020-08-30 DIAGNOSIS — N9089 Other specified noninflammatory disorders of vulva and perineum: Secondary | ICD-10-CM | POA: Diagnosis not present

## 2020-08-30 DIAGNOSIS — N890 Mild vaginal dysplasia: Secondary | ICD-10-CM

## 2020-08-30 DIAGNOSIS — R87619 Unspecified abnormal cytological findings in specimens from cervix uteri: Secondary | ICD-10-CM | POA: Diagnosis not present

## 2020-08-30 MED ORDER — CLOTRIMAZOLE-BETAMETHASONE 1-0.05 % EX CREA: 1.0000 "application " | TOPICAL_CREAM | Freq: Two times a day (BID) | CUTANEOUS | 0 refills | Status: AC

## 2020-08-30 NOTE — Patient Instructions (Addendum)
Vulva Biopsy, Care After This sheet gives you information about how to care for yourself after your procedure. Your doctor may also give you more specific instructions. If youhave problems or questions, contact your doctor. What can I expect after the procedure? After the procedure, it is common to have: Slight bleeding from the biopsy site. Soreness at the biopsy site. Follow these instructions at home: Biopsy site care  Follow instructions from your doctor about how to take care of your biopsy site. Make sure you: Clean the area using water and mild soap two times a day or as told by your doctor. Gently pat the area dry. If you were prescribed an antibiotic ointment, apply it as told by your doctor. Do not stop using the antibiotic even if your condition gets better. Take a warm water bath (sitz bath) as needed to help with pain. A sitz bath is taken while you are sitting down. The water should only come up to your hips and cover your butt. Leave stitches (sutures), skin glue, or skin tape (adhesive) strips in place. They may need to stay in place for 2 weeks or longer. If tape strips get loose and curl up, you may trim the loose edges. Do not remove tape strips completely unless your doctor says it is okay. Check your biopsy area every day for signs of infection. Check for: More redness, swelling, or pain. More fluid or blood. Warmth. Pus or a bad smell. Do not rub the biopsy area after peeing (urinating). Gently pat the area dry, or use a bottle filled with warm water (peri-bottle) to clean the area. Gently wipe from front to back.  Lifestyle Wear loose, cotton underwear. Do not wear tight pants. Do not use a tampon, douche, or put anything in your vagina for at least 1 week or until your doctor says it is okay. Do not have sex for at least 1 week or until your doctor says it is okay. Do not exercise until your doctor says it is okay. Do not swim or use a hot tub until your doctor  says it is okay. You may shower or take a sitz bath. General instructions Take over-the-counter and prescription medicines only as told by your doctor. Use a sanitary pad until the bleeding stops. Keep all follow-up visits as told by your doctor. This is important. Contact a doctor if: You have more redness, swelling, or pain around your biopsy site. You have more fluid or blood coming from your biopsy site. Your biopsy site feels warm when you touch it. Medicines do not help with your pain. Get help right away if: You have a lot of bleeding from the vulva. You have pus or a bad smell coming from your biopsy site. You have a fever. You have pain in the lower belly (abdomen). Summary After the procedure, it is common to have slight bleeding and soreness at the biopsy site. Follow all instructions as told by your doctor. Clean the area with water and mild soap. Do not rub. Pat the area dry. Take sitz baths as needed. Leave any stitches in place. Check your biopsy site for infection. Signs include more redness, swelling, pain, fluid, or blood, or feeling warm when you touch it. Get help right away if you have a lot of bleeding, a fever, pus or a bad smell, or pain in your lower belly. This information is not intended to replace advice given to you by your health care provider. Make sure you discuss any  questions you have with your healthcare provider. Document Revised: 06/28/2017 Document Reviewed: 06/28/2017 Elsevier Patient Education  2022 [place] After This sheet gives you information about how to care for yourself after your procedure. Your doctor may also give you more specific instructions. If youhave problems or questions, contact your doctor. What can I expect after the procedure? If you did not have a sample of your tissue taken out (did not have a biopsy), you may only have some spotting of blood for a few days. You can go back toyour normal activities. If  you had a sample of your tissue taken out, it is common to have: Soreness and mild pain. These may last for a few days. A light-headed feeling. Mild bleeding or fluid (discharge) coming from your vagina. The fluid will look dark and grainy. You may have this for a few days. The fluid may be caused by a liquid that was used during your procedure. You may need to wear a sanitary pad. Spotting of blood for at least 48 hours after the procedure. Follow these instructions at home: Medicines Take over-the-counter and prescription medicines only as told by your doctor. Ask your doctor what medicines you can start taking again. This is very important if you take blood thinners. Activity Limit your activity for the first day after your procedure as told by your doctor. For at least 3 days, or for as long as told by your doctor, avoid: Douching. Using tampons. Having sex. Return to your normal activities as told by your doctor. Ask your doctor what activities are safe for you. General instructions  Drink enough fluid to keep your pee (urine) pale yellow. Ask your doctor if you may take baths, swim, or use a hot tub. You may take showers. If you use birth control (contraception), keep using it. Keep all follow-up visits as told by your doctor. This is important.  Contact a doctor if: You get a skin rash. Get help right away if: You bleed a lot from your vagina. A lot of bleeding means you use more than one pad an hour for 2 hours in a row. You have clumps of blood (blood clots) coming from your vagina. You have a fever or chills. You have signs of infection. This may be fluid coming from your vagina that is: Different than normal. Yellow. Bad-smelling. You have very bad pain or cramps in your lower belly that do not get better with medicine. You faint. Summary If you did not have a sample of your tissue taken out, you may only have some spotting of blood for a few days. You can go back to  your normal activities. If you had a sample of your tissue taken out, it is common to have mild pain for a few days and spotting for 48 hours. Avoid douching, using tampons, and having sex for at least 3 days after the procedure or for as long as told. Get help right away if you have a lot of bleeding, very bad pain, or signs of infection. This information is not intended to replace advice given to you by your health care provider. Make sure you discuss any questions you have with your healthcare provider. Document Revised: 10/28/2019 Document Reviewed: 12/25/2018 Elsevier Patient Education  2022 [employer].

## 2020-09-02 LAB — SURGICAL PATHOLOGY

## 2020-09-03 ENCOUNTER — Encounter: Payer: Self-pay | Admitting: Obstetrics and Gynecology
